# Patient Record
Sex: Male | Born: 1955 | Race: White | Hispanic: No | Smoking: Current every day smoker
Health system: Southern US, Community
[De-identification: ages and names within clinical notes are randomized; demographics above are authoritative.]

## PROBLEM LIST (undated history)

## (undated) DIAGNOSIS — J189 Pneumonia, unspecified organism: Secondary | ICD-10-CM

## (undated) DIAGNOSIS — M545 Low back pain: Secondary | ICD-10-CM

## (undated) DIAGNOSIS — S42002A Fracture of unspecified part of left clavicle, initial encounter for closed fracture: Secondary | ICD-10-CM

## (undated) DIAGNOSIS — B1920 Unspecified viral hepatitis C without hepatic coma: Secondary | ICD-10-CM

## (undated) DIAGNOSIS — I700 Atherosclerosis of aorta: Secondary | ICD-10-CM

## (undated) DIAGNOSIS — F1011 Alcohol abuse, in remission: Secondary | ICD-10-CM

## (undated) DIAGNOSIS — K449 Diaphragmatic hernia without obstruction or gangrene: Secondary | ICD-10-CM

## (undated) DIAGNOSIS — E785 Hyperlipidemia, unspecified: Secondary | ICD-10-CM

## (undated) DIAGNOSIS — K861 Other chronic pancreatitis: Secondary | ICD-10-CM

## (undated) DIAGNOSIS — K219 Gastro-esophageal reflux disease without esophagitis: Secondary | ICD-10-CM

## (undated) DIAGNOSIS — G8929 Other chronic pain: Secondary | ICD-10-CM

## (undated) DIAGNOSIS — K2970 Gastritis, unspecified, without bleeding: Secondary | ICD-10-CM

## (undated) DIAGNOSIS — F411 Generalized anxiety disorder: Secondary | ICD-10-CM

## (undated) DIAGNOSIS — R55 Syncope and collapse: Secondary | ICD-10-CM

## (undated) DIAGNOSIS — E0590 Thyrotoxicosis, unspecified without thyrotoxic crisis or storm: Secondary | ICD-10-CM

## (undated) DIAGNOSIS — M1990 Unspecified osteoarthritis, unspecified site: Secondary | ICD-10-CM

## (undated) DIAGNOSIS — K209 Esophagitis, unspecified: Secondary | ICD-10-CM

## (undated) DIAGNOSIS — R51 Headache: Secondary | ICD-10-CM

## (undated) DIAGNOSIS — F1491 Cocaine use, unspecified, in remission: Secondary | ICD-10-CM

## (undated) DIAGNOSIS — J939 Pneumothorax, unspecified: Secondary | ICD-10-CM

## (undated) DIAGNOSIS — Z87898 Personal history of other specified conditions: Secondary | ICD-10-CM

## (undated) DIAGNOSIS — N400 Enlarged prostate without lower urinary tract symptoms: Secondary | ICD-10-CM

## (undated) DIAGNOSIS — S2242XA Multiple fractures of ribs, left side, initial encounter for closed fracture: Secondary | ICD-10-CM

## (undated) HISTORY — DX: Gastritis, unspecified, without bleeding: K29.70

## (undated) HISTORY — DX: Generalized anxiety disorder: F41.1

## (undated) HISTORY — DX: Hyperlipidemia, unspecified: E78.5

## (undated) HISTORY — DX: Thyrotoxicosis, unspecified without thyrotoxic crisis or storm: E05.90

## (undated) HISTORY — DX: Syncope and collapse: R55

## (undated) HISTORY — DX: Unspecified viral hepatitis C without hepatic coma: B19.20

## (undated) HISTORY — DX: Diaphragmatic hernia without obstruction or gangrene: K44.9

## (undated) HISTORY — DX: Atherosclerosis of aorta: I70.0

## (undated) HISTORY — DX: Benign prostatic hyperplasia without lower urinary tract symptoms: N40.0

## (undated) HISTORY — DX: Esophagitis, unspecified: K20.9

## (undated) HISTORY — DX: Pneumothorax, unspecified: J93.9

---

## 1979-06-03 HISTORY — PX: KNEE ARTHROSCOPY W/ DEBRIDEMENT: SHX1867

## 1989-06-02 HISTORY — PX: SPLENECTOMY, PARTIAL: SHX787

## 1993-10-02 HISTORY — PX: CHOLECYSTECTOMY: SHX55

## 1995-04-02 HISTORY — PX: NISSEN FUNDOPLICATION: SHX2091

## 2001-10-26 ENCOUNTER — Emergency Department (HOSPITAL_COMMUNITY): Admission: EM | Admit: 2001-10-26 | Discharge: 2001-10-26 | Payer: Self-pay | Admitting: *Deleted

## 2001-10-26 ENCOUNTER — Encounter: Payer: Self-pay | Admitting: *Deleted

## 2003-02-25 ENCOUNTER — Emergency Department (HOSPITAL_COMMUNITY): Admission: EM | Admit: 2003-02-25 | Discharge: 2003-02-25 | Payer: Self-pay | Admitting: *Deleted

## 2003-02-25 ENCOUNTER — Encounter: Payer: Self-pay | Admitting: *Deleted

## 2003-04-20 ENCOUNTER — Emergency Department (HOSPITAL_COMMUNITY): Admission: EM | Admit: 2003-04-20 | Discharge: 2003-04-21 | Payer: Self-pay | Admitting: Emergency Medicine

## 2003-04-22 ENCOUNTER — Inpatient Hospital Stay (HOSPITAL_COMMUNITY): Admission: EM | Admit: 2003-04-22 | Discharge: 2003-04-24 | Payer: Self-pay | Admitting: Emergency Medicine

## 2003-04-22 ENCOUNTER — Encounter: Payer: Self-pay | Admitting: *Deleted

## 2003-04-25 ENCOUNTER — Inpatient Hospital Stay (HOSPITAL_COMMUNITY): Admission: EM | Admit: 2003-04-25 | Discharge: 2003-04-27 | Payer: Self-pay | Admitting: Emergency Medicine

## 2004-01-20 ENCOUNTER — Inpatient Hospital Stay (HOSPITAL_COMMUNITY): Admission: EM | Admit: 2004-01-20 | Discharge: 2004-01-21 | Payer: Self-pay | Admitting: Emergency Medicine

## 2004-02-20 ENCOUNTER — Observation Stay (HOSPITAL_COMMUNITY): Admission: EM | Admit: 2004-02-20 | Discharge: 2004-02-22 | Payer: Self-pay | Admitting: Emergency Medicine

## 2004-03-27 ENCOUNTER — Emergency Department (HOSPITAL_COMMUNITY): Admission: EM | Admit: 2004-03-27 | Discharge: 2004-03-28 | Payer: Self-pay | Admitting: Emergency Medicine

## 2005-03-15 ENCOUNTER — Emergency Department (HOSPITAL_COMMUNITY): Admission: EM | Admit: 2005-03-15 | Discharge: 2005-03-15 | Payer: Self-pay | Admitting: Emergency Medicine

## 2005-04-16 ENCOUNTER — Ambulatory Visit: Payer: Self-pay | Admitting: Internal Medicine

## 2005-04-16 ENCOUNTER — Observation Stay (HOSPITAL_COMMUNITY): Admission: EM | Admit: 2005-04-16 | Discharge: 2005-04-17 | Payer: Self-pay | Admitting: *Deleted

## 2005-08-17 ENCOUNTER — Emergency Department (HOSPITAL_COMMUNITY): Admission: EM | Admit: 2005-08-17 | Discharge: 2005-08-17 | Payer: Self-pay | Admitting: Emergency Medicine

## 2006-02-18 ENCOUNTER — Inpatient Hospital Stay (HOSPITAL_COMMUNITY): Admission: EM | Admit: 2006-02-18 | Discharge: 2006-02-18 | Payer: Self-pay | Admitting: Emergency Medicine

## 2006-02-19 ENCOUNTER — Inpatient Hospital Stay (HOSPITAL_COMMUNITY): Admission: EM | Admit: 2006-02-19 | Discharge: 2006-02-19 | Payer: Self-pay | Admitting: Emergency Medicine

## 2006-02-20 ENCOUNTER — Emergency Department (HOSPITAL_COMMUNITY): Admission: EM | Admit: 2006-02-20 | Discharge: 2006-02-20 | Payer: Self-pay | Admitting: Emergency Medicine

## 2006-02-22 ENCOUNTER — Observation Stay (HOSPITAL_COMMUNITY): Admission: EM | Admit: 2006-02-22 | Discharge: 2006-02-23 | Payer: Self-pay | Admitting: Emergency Medicine

## 2006-11-13 ENCOUNTER — Emergency Department (HOSPITAL_COMMUNITY): Admission: EM | Admit: 2006-11-13 | Discharge: 2006-11-13 | Payer: Self-pay | Admitting: Emergency Medicine

## 2007-03-21 ENCOUNTER — Emergency Department (HOSPITAL_COMMUNITY): Admission: EM | Admit: 2007-03-21 | Discharge: 2007-03-21 | Payer: Self-pay | Admitting: Emergency Medicine

## 2007-03-29 ENCOUNTER — Emergency Department (HOSPITAL_COMMUNITY): Admission: EM | Admit: 2007-03-29 | Discharge: 2007-03-29 | Payer: Self-pay | Admitting: Emergency Medicine

## 2007-05-21 ENCOUNTER — Emergency Department (HOSPITAL_COMMUNITY): Admission: EM | Admit: 2007-05-21 | Discharge: 2007-05-21 | Payer: Self-pay | Admitting: Emergency Medicine

## 2008-02-25 ENCOUNTER — Emergency Department (HOSPITAL_COMMUNITY): Admission: EM | Admit: 2008-02-25 | Discharge: 2008-02-26 | Payer: Self-pay | Admitting: Emergency Medicine

## 2008-04-20 ENCOUNTER — Emergency Department (HOSPITAL_COMMUNITY): Admission: EM | Admit: 2008-04-20 | Discharge: 2008-04-20 | Payer: Self-pay | Admitting: Emergency Medicine

## 2008-05-02 ENCOUNTER — Observation Stay (HOSPITAL_COMMUNITY): Admission: EM | Admit: 2008-05-02 | Discharge: 2008-05-07 | Payer: Self-pay | Admitting: Emergency Medicine

## 2008-05-30 ENCOUNTER — Inpatient Hospital Stay (HOSPITAL_COMMUNITY): Admission: EM | Admit: 2008-05-30 | Discharge: 2008-06-14 | Payer: Self-pay | Admitting: Emergency Medicine

## 2008-06-03 ENCOUNTER — Encounter (INDEPENDENT_AMBULATORY_CARE_PROVIDER_SITE_OTHER): Payer: Self-pay | Admitting: Gastroenterology

## 2008-06-11 ENCOUNTER — Encounter (INDEPENDENT_AMBULATORY_CARE_PROVIDER_SITE_OTHER): Payer: Self-pay | Admitting: Gastroenterology

## 2008-06-12 DIAGNOSIS — B1920 Unspecified viral hepatitis C without hepatic coma: Secondary | ICD-10-CM

## 2008-06-12 HISTORY — DX: Unspecified viral hepatitis C without hepatic coma: B19.20

## 2008-06-23 ENCOUNTER — Emergency Department (HOSPITAL_COMMUNITY): Admission: EM | Admit: 2008-06-23 | Discharge: 2008-06-23 | Payer: Self-pay | Admitting: Emergency Medicine

## 2008-07-05 ENCOUNTER — Emergency Department (HOSPITAL_COMMUNITY): Admission: EM | Admit: 2008-07-05 | Discharge: 2008-07-05 | Payer: Self-pay | Admitting: Emergency Medicine

## 2008-07-10 ENCOUNTER — Emergency Department (HOSPITAL_COMMUNITY): Admission: EM | Admit: 2008-07-10 | Discharge: 2008-07-11 | Payer: Self-pay | Admitting: Emergency Medicine

## 2008-07-12 ENCOUNTER — Ambulatory Visit: Payer: Self-pay | Admitting: Family Medicine

## 2008-07-12 ENCOUNTER — Inpatient Hospital Stay (HOSPITAL_COMMUNITY): Admission: EM | Admit: 2008-07-12 | Discharge: 2008-07-14 | Payer: Self-pay | Admitting: Emergency Medicine

## 2008-07-19 ENCOUNTER — Emergency Department (HOSPITAL_COMMUNITY): Admission: EM | Admit: 2008-07-19 | Discharge: 2008-07-20 | Payer: Self-pay | Admitting: Emergency Medicine

## 2008-07-22 ENCOUNTER — Ambulatory Visit: Payer: Self-pay | Admitting: Family Medicine

## 2009-01-25 ENCOUNTER — Emergency Department (HOSPITAL_COMMUNITY): Admission: EM | Admit: 2009-01-25 | Discharge: 2009-01-25 | Payer: Self-pay | Admitting: Emergency Medicine

## 2009-01-28 ENCOUNTER — Emergency Department (HOSPITAL_COMMUNITY): Admission: EM | Admit: 2009-01-28 | Discharge: 2009-01-28 | Payer: Self-pay | Admitting: Emergency Medicine

## 2009-05-03 ENCOUNTER — Emergency Department (HOSPITAL_COMMUNITY): Admission: EM | Admit: 2009-05-03 | Discharge: 2009-05-04 | Payer: Self-pay | Admitting: Emergency Medicine

## 2009-05-14 ENCOUNTER — Emergency Department (HOSPITAL_COMMUNITY): Admission: EM | Admit: 2009-05-14 | Discharge: 2009-05-14 | Payer: Self-pay | Admitting: Emergency Medicine

## 2011-01-07 LAB — COMPREHENSIVE METABOLIC PANEL
ALT: 108 U/L — ABNORMAL HIGH (ref 0–53)
AST: 104 U/L — ABNORMAL HIGH (ref 0–37)
AST: 36 U/L (ref 0–37)
Albumin: 4.8 g/dL (ref 3.5–5.2)
Alkaline Phosphatase: 107 U/L (ref 39–117)
BUN: 9 mg/dL (ref 6–23)
CO2: 27 mEq/L (ref 19–32)
Calcium: 9.7 mg/dL (ref 8.4–10.5)
Chloride: 102 mEq/L (ref 96–112)
Creatinine, Ser: 1.13 mg/dL (ref 0.4–1.5)
GFR calc Af Amer: >60 mL/min (ref >60)
GFR calc Af Amer: >60 mL/min (ref >60)
GFR calc non Af Amer: >60 mL/min (ref >60)
Glucose, Bld: 113 mg/dL — ABNORMAL HIGH (ref 70–99)
Potassium: 4.9 mEq/L (ref 3.5–5.1)
Sodium: 138 mEq/L (ref 135–145)
Sodium: 139 mEq/L (ref 135–145)
Total Bilirubin: 0.9 mg/dL (ref 0.3–1.2)
Total Protein: 7.4 g/dL (ref 6.0–8.3)
Total Protein: 9.2 g/dL — ABNORMAL HIGH (ref 6.0–8.3)

## 2011-01-07 LAB — POCT I-STAT, CHEM 8
Calcium, Ion: 1.17 mmol/L (ref 1.12–1.32)
Creatinine, Ser: 0.9 mg/dL (ref 0.4–1.5)
Glucose, Bld: 134 mg/dL — ABNORMAL HIGH (ref 70–99)
HCT: 52 % (ref 39.0–52.0)
Hemoglobin: 17.7 g/dL — ABNORMAL HIGH (ref 13.0–17.0)
Potassium: 4.9 mEq/L (ref 3.5–5.1)

## 2011-01-07 LAB — DIFFERENTIAL
Basophils Absolute: 0.1 10*3/uL (ref 0.0–0.1)
Basophils Relative: 1 % (ref 0–1)
Eosinophils Absolute: 0 10*3/uL (ref 0.0–0.7)
Eosinophils Relative: 0 % (ref 0–5)
Lymphocytes Relative: 22 % (ref 12–46)
Lymphs Abs: 2.2 10*3/uL (ref 0.7–4.0)
Monocytes Absolute: 0.2 10*3/uL (ref 0.1–1.0)
Monocytes Relative: 13 % — ABNORMAL HIGH (ref 3–12)
Monocytes Relative: 3 % (ref 3–12)
Neutrophils Relative %: 63 % (ref 43–77)

## 2011-01-07 LAB — LIPASE, BLOOD: Lipase: 31 U/L (ref 11–59)

## 2011-01-07 LAB — CBC
HCT: 46.5 % (ref 39.0–52.0)
MCHC: 34 g/dL (ref 30.0–36.0)
MCV: 98.9 fL (ref 78.0–100.0)
Platelets: 291 10*3/uL (ref 150–400)
RBC: 4.51 MIL/uL (ref 4.22–5.81)
RDW: 13.1 % (ref 11.5–15.5)
RDW: 13.7 % (ref 11.5–15.5)
WBC: 7.5 10*3/uL (ref 4.0–10.5)

## 2011-01-07 LAB — HEMOCCULT GUIAC POC 1CARD (OFFICE): Fecal Occult Bld: NEGATIVE

## 2011-01-07 LAB — URINALYSIS, ROUTINE W REFLEX MICROSCOPIC
Glucose, UA: NEGATIVE mg/dL
Hgb urine dipstick: NEGATIVE
Specific Gravity, Urine: 1.029 (ref 1.005–1.030)
Urobilinogen, UA: 1 mg/dL (ref 0.0–1.0)

## 2011-01-07 LAB — URINE MICROSCOPIC-ADD ON

## 2011-01-11 LAB — COMPREHENSIVE METABOLIC PANEL
Albumin: 4.4 g/dL (ref 3.5–5.2)
Alkaline Phosphatase: 82 U/L (ref 39–117)
BUN: 21 mg/dL (ref 6–23)
CO2: 27 mEq/L (ref 19–32)
Chloride: 105 mEq/L (ref 96–112)
GFR calc non Af Amer: >60 mL/min (ref >60)
Glucose, Bld: 142 mg/dL — ABNORMAL HIGH (ref 70–99)
Potassium: 3.8 mEq/L (ref 3.5–5.1)
Total Bilirubin: 0.6 mg/dL (ref 0.3–1.2)

## 2011-01-11 LAB — URINALYSIS, ROUTINE W REFLEX MICROSCOPIC
Bilirubin Urine: NEGATIVE
Nitrite: NEGATIVE
Specific Gravity, Urine: >1.03 — ABNORMAL HIGH (ref 1.005–1.030)
Urobilinogen, UA: 0.2 mg/dL (ref 0.0–1.0)

## 2011-01-11 LAB — CBC
HCT: 46.1 % (ref 39.0–52.0)
Hemoglobin: 15.9 g/dL (ref 13.0–17.0)
MCV: 97.6 fL (ref 78.0–100.0)
RBC: 4.72 MIL/uL (ref 4.22–5.81)
WBC: 11.4 10*3/uL — ABNORMAL HIGH (ref 4.0–10.5)

## 2011-01-11 LAB — DIFFERENTIAL
Basophils Absolute: 0 10*3/uL (ref 0.0–0.1)
Basophils Relative: 0 % (ref 0–1)
Monocytes Absolute: 0.6 10*3/uL (ref 0.1–1.0)
Neutro Abs: 9 10*3/uL — ABNORMAL HIGH (ref 1.7–7.7)
Neutrophils Relative %: 79 % — ABNORMAL HIGH (ref 43–77)

## 2011-01-11 LAB — URINE MICROSCOPIC-ADD ON

## 2011-01-11 LAB — LIPASE, BLOOD: Lipase: 20 U/L (ref 11–59)

## 2011-02-14 NOTE — Discharge Summary (Signed)
[name], [name]              ACCOUNT NO.:  [id number]   MEDICAL RECORD NO.:  [id number]          PATIENT TYPE:  INP   LOCATION:  5157                         FACILITY:  [facility]   PHYSICIAN:  Leighton Roach McDiarmid, M.D.DATE OF BIRTH:  [birth year]   DATE OF ADMISSION:  07/12/2008  DATE OF DISCHARGE:  07/14/2008                               DISCHARGE SUMMARY   PRIMARY CARE [name]:  Will be [facility].   DISCHARGE DIAGNOSES:  1. Possible Chronic pancreatitis exacerbation, though normal MRCP of      pancreas this admission.  2. Alcohol dependence.  3. Nicotine dependence.  4. Chronic pain issues.  5. Lack of health care/homelessness, difficulty with follow-up.   DISCHARGE MEDICATIONS:  Included Percocet 5/325 mg p.o. q6 h. p.r.n.  pain.  The patient was given enough to make it to his [facility]  appointment on July 22, 2008 at 11:15.   There were no consults for this patient while he was in the hospital.   PROCEDURES:  He did have an MRCP while in the hospital that showed a  normal-appearing pancreas with no pancreatic or peripancreatic edema or  fluid.  No pancreatic duct dilation.  No evidence for pseudocyst or  peripancreatic abscess.  No evidence for choledocholithiasis.  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  He also had an acute abdominal series on  July 12, 2008, which showed unremarkable bowel gas pattern and no  acute thoracic findings.   PERTINENT LABORATORY DATA:  For this hospitalization includes a CBC with  white blood count of 6.7, hemoglobin 13.8, hematocrit 41.2 and platelets  of 346.  He had a PT of 12.1, an INR of 0.9, a PTT of 26.  A urinalysis  that showed amber-colored urine that was hazy with a specific gravity of  1.031.  He had small bilirubin, 15 ketones, negative nitrites, negative  leukocytes.  His CMP showed sodium of 134, potassium of 3.7, chloride  101, bicarb 27, BUN 15,  creatinine 0.79, glucose 119.  His total  bilirubin was 0.6, alk phos 69, AST 69, ALT 115, total protein 7.4,  albumin 3.9, calcium 9.  He had an alcohol level that was less than 5.  He had a lipase of 26.  His urine drug screen was positive for  benzodiazepines, but all others were negative.  On the day prior to  discharge, he had a CBC on October 12:  White blood cell count of 6.9,  hemoglobin 12.7, hematocrit 38.3, platelets of 323, and he had a CMP  which showed a sodium of 139, potassium 3.4, chloride of 107, bicarb 24,  BUN 11, creatinine 0.77 with a glucose of 93.  He had a urine culture  which showed no growth.   BRIEF HOSPITAL COURSE:  This is a 55 year old gentleman that came in  with abdominal pain with nausea and vomiting.  He has had multiple  visits to the ER.  He had been discharged from the hospital on September  22 and had been back to the hospital multiple times with similar  complaints  of nausea, vomiting, and abdominal pain.  At this time, he  returned with nausea, vomiting, abdominal pain, and back pain.  He also  complained of lower limb and muscle aching and that his hands are tingly  in the morning.  He mentioned that he was not able to hold down any  food.  He denied any sore throat or fever or loose stools.  He said that  his last alcoholic drink had been the Thursday before admission which  was 3-4 days.  He claims that Percocet did not do anything for his pain  and that Phenergan did not do anything for his nausea.  For his  abdominal pain, the patient had had an extensive workup for chronic  pancreatitis in the past, so on this admission, our goal really was to  hydrate him.  We gave him a bolus with a banana bag and with D5 half  normal saline at 125 per hour.  He was continued to get Zofran and  morphine for the nausea and vomiting and pain.  During his hospital  stay, he did get an MRCP.  The patient had had prior studies done by GI  in September of 2009.   He had an EGD that showed portal hypertension,  hypertensive gastropathy and thickening of the pylorus.  He has had  biopsies that showed no H. Pylori.  He had a colonoscopy and was found  to have medium-sized polyps, a tubular adenoma without evidence of  dysplasia or malignancy.  Also, they felt the pain could be secondary to  chronic pancreatitis as there were no calcifications of the pancreas on  CT.  For his alcohol dependence, the patient does drink quite a bit of  alcohol and on previous admissions had had an elevated alcohol level.  On October 9, it had been 147.  There was concern that the patient may  be going into alcohol withdrawal.  He was started on the CIWA protocol  with Ativan and tolerated it well.  The patient was counseled that the  alcohol likely was causing the abdominal pain with his pancreas.  1. Nicotine addition.  The patient smokes about half pack per day per      the patient.  While he was in house, he was put on a nicotine patch      for replacement of nicotine.  2. Chronic pain issue.  The patient does have chronic pain that he      comes to the ED to have treated.  The plan was to get the patient      set up with an appointment for follow-up as an outpatient with      [facility].  There was a bit of a disposition problem due to the      fact that the patient had no identification and can only be seen at      [facility] if he has identification.  He was given the paperwork      to fill out to order his birth certificate so that he could get      identification so that he could be seen [facility].  He filled out      the information, and it was faxed from the hospital.  He also had      an appointment that was set up for him at [facility] on July 22, 2008.  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  He declined social work help in getting him      to a different living situation.  For the chronic pain issues  that      the patient has, it would be worth considering getting him to a      pain clinic to see if they could consider celiac plexus ablation.      Also for his pancreatitis, it would be worth considering adding      pancreatic enzymes with his meals if his chronic pancreatitis does      not subside.   DISCHARGE INSTRUCTIONS:  For this patient included following up with  [facility].  He was to be on a low-fat diet.  The patient was  discharged home with Percocet for pain management.  He was in stable  medical condition.      Jamie Brookes, MD  Electronically Signed      Leighton Roach McDiarmid, M.D.  Electronically Signed    AS/MEDQ  D:  07/21/2008  T:  07/21/2008  Job:  [id number]   cc:   Melvern Banker
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
## 2011-02-14 NOTE — Discharge Summary (Signed)
[name], [name]              ACCOUNT NO.:  [id number]   MEDICAL RECORD NO.:  [id number]          PATIENT TYPE:  INP   LOCATION:  6715                         FACILITY:  [facility]   PHYSICIAN:  Hillery Aldo, M.D.   DATE OF BIRTH:  1956-03-14   DATE OF ADMISSION:  05/30/2008  DATE OF DISCHARGE:  06/12/2008                               DISCHARGE SUMMARY   PRIMARY CARE PHYSICIAN:  None.   DISCHARGE DIAGNOSES:  1. Acute exacerbation of chronic abdominal pain.  2. Back pain.  3. Likely irritable bowel syndrome.  4. Fatty liver.  5. Portal hypertension.  6. 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  7. Hepatitis C.  8. Transaminitis.  9. Alcohol dependency.  10.Alcohol-induced hepatitis.  11.Opiate dependence.  12.Tobacco abuse.  13.Macrocytosis.   DISCHARGE MEDICATIONS:  1. Lortab 1-2 tablets q.4 h. p.r.n.  2. Xanax 1 mg t.i.d. p.r.n.  3. Prevacid 40 mg b.i.d.  4. Levsin 0.25 mg q.4 h. p.r.n.  5. Multivitamin daily.   CONSULTATIONS:  1. Shirley Friar, MD  2. Bernette Redbird, MD   BRIEF ADMISSION HISTORY OF PRESENT ILLNESS:  The patient is a 55-year-  old male with alcohol dependence and poor social circumstances who  presents to the hospital with repeated complaints of abdominal pain.  The patient has had numerous visits to the emergency department and  subsequent discharges and multiple evaluations for the source of his  abdominal pain, all of which have been unrevealing.  Nevertheless, he  presented with recurrent abdominal pain in the setting of ongoing  alcohol use and was admitted for further evaluation and workup due to  his frequent representations to the emergency department.  For the full  details, please see the dictated report done by Dr. Waymon Amato.   PROCEDURES AND DIAGNOSTIC STUDIES:  1. Two views of the abdomen on June 01, 2008, showed no acute      abnormalities with an unremarkable bowel gas pattern.  2. Abdominal ultrasound on June 01, 2008, showed probable fatty      infiltration of the liver.  There was a simple cyst in the upper      pole, right kidney.  The gallbladder and spleen were surgically      absent.  3. Esophagoduodenoscopy on June 04, 2008, showed portal      hypertensive gastropathy with diffuse mosaic pattern secondary to      alcohol use.  Thickening in the pyloric channel, status post      biopsies.  No ulcers and no varices were noted.  Biopsy results      were consistent with portal hypertensive gastropathy.  No evidence      of Helicobacter pylori, intestinal metaplasia, dysplasia, or      malignancy was identified.  4. Barium enema on June 09, 2008, showed persistent stricture in      the right colon with some mucosal irregularity just above the      ileocecal valve region.  5. Colonoscopy on June 11, 2008, showed no evidence of proximal      colonic lesion even after careful repeated inspection of the  proximal colonic area despite radiographic abnormality in that      region.  Medium-sized colon polyps removed and sent for pathology,      which is pending at the time of this dictation.  6. One view of the abdomen on June 11, 2008, showed large volume      of retained barium throughout the colon.   DISCHARGE LABORATORY VALUES:  CBC was completely normal.  Basic  metabolic panel was completely normal.  Antigliadin antibodies were  negative.  Stool cultures revealed no suspicious growth.  Fecal  lactoferrin was negative.  C. difficile toxin was negative.   HOSPITAL COURSE BY PROBLEMS:  1. Acute exacerbation of chronic abdominal pain:  The patient has had      multiple evaluations and multiple presentations to the hospital for      this issue.  He has undergone numerous diagnostic testing all of      which have been unrevealing..  The patient has repeatedly been      advised to discontinue alcohol and has repeatedly gone back to      alcohol use.  At this point, we have no  further diagnostic testing      or recommendations for this patient other than to avoid alcohol and      continue his proton pump inhibitor therapy.  He may also have some      component of irritable bowel syndrome and was educated with regard      to the need to maintain his regularity and increased to fiber in      his diet.  A component of his abdominal pain may also be due to      degenerative disk and osteoarthritis of the back.  Notably, he has      had an MRI of the lumbar spine on a previous admission, which did      show degenerative disk disease and some herniation at L4-L5.  There      was also an annular tear at L5-S1 and a small left posterolateral      protrusion approaching the left S1 nerve root.  This is likely the      source of his flank and left lower quadrant pain.  The patient is      not an operative candidate and has been advised that other than      pain control and possibly engaging physical therapy, no other      treatment would be indicated at this time.  2. Fatty liver/portal hypertension/gastropathy:  The patient has been      advised to discontinue alcohol use.  3. Hepatitis C positivity with transaminitis and alcohol-induced      hepatitis.  The patient does have moderate elevation of his LFTs      and should follow up at [facility] for ongoing surveillance.  4. Alcohol dependency:  The patient underwent detoxification with an      Ativan protocol.  He was supplemented with thiamine and folic acid.      He will be referred to alcohol and drug services at discharge for      ongoing substance abuse counseling.  5. Opiate dependency:  The patient does have a tendency to request      frequent doses of opiates.  He has been advised that this is not a      good long-term treatment for his underlying issues.  The opiates      have been weaned to 1-2 tablets  of Vicodin every 6 hours.  We do      recommend ultimate tapering and discontinuation entirely of all       opiates.  6. Tobacco abuse:  The patient was provided with a nicotine patch      while in the hospital.  Despite this, he continued to attempt to      sneak cigarettes and smoke in his bathroom.  He has been advised      that tobacco use will exacerbate his underlying medical conditions      and he should discontinue tobacco use.  7. Macrocytosis:  The patient was supplemented with a multivitamin and      thiamine.  8. Anxiety and depression:  The patient responded well to Lexapro and      Klonopin.  Unfortunately, he has no pair source and will not be      able to afford these at discharge.  We will have the care      manager/social worker help set up followup at [facility] and get      him plugged in, so that he can receive medical services as      indicated.   DISPOSITION:  The patient is currently homeless and we are attempting to  obtain a disposition for him at one of the local shelters.  If such, a  disposition can be arranged, he will be discharged today.  Otherwise, it  may have to wait until we have an appropriate place to discharge him to.      Hillery Aldo, M.D.  Electronically Signed     CR/MEDQ  D:  06/12/2008  T:  06/12/2008  Job:  [id number]
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
## 2011-02-14 NOTE — Op Note (Signed)
[name], [name]              ACCOUNT NO.:  [id number]   MEDICAL RECORD NO.:  [id number]          PATIENT TYPE:  INP   LOCATION:                               FACILITY:  [facility]   PHYSICIAN:  Shirley Friar, MDDATE OF BIRTH:  [birth year]   DATE OF PROCEDURE:  DATE OF DISCHARGE:                               OPERATIVE REPORT   INDICATION:  Abdominal pain.   MEDICATIONS:  Fentanyl 100 mcg IV, Versed 10 mg IV, and Phenergan 50 mg  IV.   FINDINGS:  Endoscope was inserted through oropharynx and esophagus was  intubated, which was normal in its entirety.  No varices were seen.  Endoscope was passed down to the stomach, which revealed a diffuse  mosaic pattern in the stomach consistent with portal hypertensive  gastropathy.  Retroflexion was done, which revealed no other mucosal  abnormalities except for the mosaic pattern.  Endoscope was straightened  and advanced down into the pyloric channel where there was a thickened  portion noted at 12 o'clock position.  Endoscope was advanced into the  duodenal bulb and second portion of duodenum which were both normal.  Endoscope was withdrawn back into the stomach and two biopsies were  taken of this thickened fold in the pyloric channel.  No ulcers or mass  lesions were seen.   ASSESSMENT:  1. Portal hypertensive gastropathy with diffuse mosaic pattern      secondary to alcohol use.  2. Thickening in pyloric channel, status post biopsies.  3. No ulcers and no varices noted.   PLAN:  1. Stop drink and alcohol.  2. Follow up on pathology.  3. Clear liquid diet and advance as tolerated.      Shirley Friar, MD  Electronically Signed     VCS/MEDQ  D:  06/04/2008  T:  06/04/2008  Job:  [id number]

## 2011-02-14 NOTE — Op Note (Signed)
[name], [name]              ACCOUNT NO.:  [id number]   MEDICAL RECORD NO.:  [id number]          PATIENT TYPE:  INP   LOCATION:  6715                         FACILITY:  [facility]   PHYSICIAN:  Bernette Redbird, M.D.   DATE OF BIRTH:  02-04-1956   DATE OF PROCEDURE:  06/11/2008  DATE OF DISCHARGE:                               OPERATIVE REPORT   PROCEDURE:  Colonoscopy with polypectomy.   INDICATIONS:  This is a 55 year old gentleman, unassigned patient, on  the [facility] hospitalist service with a history of abdominal pain in  the setting of alcohol and tobacco abuse, who, as part of this  evaluation, underwent a barium enema yesterday.  There is the suggestion  of an obstructing mass in the proximal colon.   FINDINGS:  No evidence of intraluminal mass.  Medium-size polyp snared  from the mid colon.   PROCEDURE:  The procedure had been discussed with the patient who  provided written consent.  He was brought from his hospital room  following a magnesium citrate and MiraLax prep.  Sedation was Phenergan  25 mg, fentanyl 125 mcg, and Versed 12 mg IV without clinical  instability.  Digital exam of the prostate was unremarkable.   The Pentax pediatric video colonoscope was advanced without significant  difficulty around the colon.   Ultimately, I reached the cecum as identified by visualization of the  appendiceal orifice.  There was quite a bit of spasm in the cecal region  which might account for the above-mentioned radiographic abnormality,  but with irrigation with water, careful inspection, insufflation with  air, and simply waiting for the spasm to improve, it was possible to see  the entire region of the cecum and the proximal ascending colon very  clearly, and it is not felt that any mass lesion was present whatsoever.  As further confirmation, I entered the terminal ileum for moderate  distance, thereby confirming cecal location.  There was no evidence of  ileitis.   No  mucosal abnormalities were identified on this exam, specifically no  evidence of inflammatory bowel disease.   There was an 8-mm semi-pedunculated polyp on the fold in the transverse  colon removed by cold snare technique and suctioned through the scope.  No other polyps were seen.   There was a small-to-moderate amount of residual barium throughout the  colon from yesterday's barium enema, despite his prep.  This was rinsed  copiously, using 3 L of water during the course of this 1-hour 5-minute  exam, and it was felt that no significant lesions would have been  missed.   Retroflexion in the rectum and pullout through the anal canal  demonstrated a slightly verrucous possible early sessile polyp right at  the anal verge, not clear whether it was above or below the dentate  line, but directly juxtaposed to an excoriated hemorrhoid.  I touched it  with a biopsy forceps, needle, and the patient indicated it hurt.  I  elected not to biopsy it.   Reinspection of the rectum was unremarkable.   IMPRESSION:  1. No evidence of proximal colonic lesion even after careful  repeated      inspection of the proximal colonic area,      despite radiographic abnormality in that region (793.4).  2. Medium-sized colon polyp removed as described above.   PLAN:  Await pathology on the polyp.  CT scan tomorrow to look for any  other source of his low abdominal pain.           ______________________________  Bernette Redbird, M.D.     RB/MEDQ  D:  06/11/2008  T:  06/12/2008  Job:  [id number]

## 2011-02-14 NOTE — Discharge Summary (Signed)
NAMEJAMAL, [name]              ACCOUNT NO.:  [id number]   MEDICAL RECORD NO.:  [id number]          PATIENT TYPE:  INP   LOCATION:  5508                         FACILITY:  [facility]   PHYSICIAN:  Hind I Elsaid, MD      DATE OF BIRTH:  16-Apr-1956   DATE OF ADMISSION:  05/01/2008  DATE OF DISCHARGE:  05/07/2008                               DISCHARGE SUMMARY   DISCHARGE DIAGNOSES:  1. Nonspecific abdominal pain.  2. History of alcoholic gastritis.  3. History of drug and alcohol abuse.  4. History of gastroesophageal reflux.  5. History of laparoscopic cholecystectomy.  6. Polysubstance abuse.  7. Smoking.  8. Subclinical hypothyroidism.  9. Back pain, felt to be secondary to degenerative joint disease.   DISCHARGE MEDICATIONS:  1. Prevacid 30 mg daily.  2. Phenergan 25 mg every 8 hours as needed, only 1 week dispensed.  3. Lortab 7.5/500 mg every 8 hours, 1 week dispensed.   PROCEDURES:  Abdominal x-ray, nonobstructive bowel-gas pattern.  CT  abdomen and pelvis, no acute finding, mild diffuse fatty infiltration of  the liver, and tiny hepatic lobe hemangioma.  MRI of the L-spine, disk  degeneration with disk herniation.   HISTORY OF PRESENT ILLNESS:  1. This is a 55 year old Caucasian male with a history of recurrent      abdominal pain for 15 years, who had multiple abdominal surgeries,      who is on chronic pain medication.  History of heavy alcohol abuse,      polysubstance abuse.  He was admitted to the hospital with      intermittent severe central abdominal pain for the last 2 days      associated with nausea and vomiting.  The patient was admitted to      the hospital and kept on clear liquid diet.  Amylase and lipase      were negative.  CT of the abdomen and pelvis inconclusive of any      acute pathology.  During hospitalization, advanced diet was done.      The patient tolerated the diet very well.  We felt the abdominal      pain is nonspecific in nature, and the  patient was asked to follow      with his primary care physician.  Pain could be referral or could      be secondary to narcotic dependence.  At this time, abdominal      examination is completely soft and benign with good bowel sounds.      The patient tolerated 100% diet during the hospital stay.  We did      not feel that the patient needed hospitalization for the above      issue.  The patient will receive Lortab and Phenergan, 1-week      supply, and he needed to follow with his primary care physician.  2. Polysubstance abuse.  Social worker did evaluate and see the      patient, but the patient denied any Child psychotherapist need.  The      patient admitted that he will quit  alcohol by himself.  He does not      require any help for that.  3. Subclinical hypothyroidism.  The patient found to have a TSH of 6.7      and normal free T4.  At this time, we did not recommend any      medication.  The patient need to follow up thyroid function test as      an outpatient.   DISPOSITION:  It was felt that the patient is medically stable to be  discharged home.      Hind Bosie Helper, MD  Electronically Signed     HIE/MEDQ  D:  05/07/2008  T:  05/07/2008  Job:  [id number]

## 2011-02-14 NOTE — Consult Note (Signed)
[name], [name]              ACCOUNT NO.:  [id number]   MEDICAL RECORD NO.:  [id number]          PATIENT TYPE:  INP   LOCATION:  6715                         FACILITY:  [facility]   PHYSICIAN:  [name], MDDATE OF BIRTH:  1956-05-07   DATE OF CONSULTATION:  DATE OF DISCHARGE:                                 CONSULTATION   REQUESTING DOCTOR:  Miguel Aldo, MD   REASON FOR CONSULTATION:  Abdominal pain.   HISTORY OF PRESENT ILLNESS:  [name] is a 55 year old white male with  history of alcohol abuse who has been seen secondary to chronic  abdominal pain.  He was admitted on May 30, 2008 secondary to  abdominal pain in the setting of ongoing alcohol abuse.  He reports  consuming up to 6-12 beers per day.  He gives a history of chronic  pancreatitis and says he last had pancreatitis several weeks ago.  On  further review of his chart, he was discharged on May 07, 2008 for  nonspecific abdominal pain and history of alcoholic gastritis.  CT scan  done on May 02, 2008 was negative for pancreatitis and no  calcifications in the pancreas were seen.  He reports chronic  generalized abdominal pain, but reports that he was pain free when he  was discharged in early August for approximately a week.  He says the  pain is generalized over the abdomen and also radiates to the left side  of his back.  He feels that drinking alcohol makes the pain worse.  He  has some nausea, but denies any vomiting.  The patient denies any fevers  or chills.   PAST MEDICAL HISTORY:  1. Polysubstance abuse.  2. Alcohol abuse.  3. History of alcoholic gastritis.  4. Gastroesophageal reflux disease.  5. Hypothyroidism.  6. Chronic back pain.  7. Chronic pain.  8. Questionable history of pancreatitis, status post Nissen      fundoplication (The patient reports partial takedown of this,      although record is not available).  9. Laparoscopic cholecystectomy.  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   CURRENT  MEDICATIONS:  Lovenox, Dilaudid, Ativan, nicotine patch,  Protonix, and thiamine.   ALLERGIES:  No known drug allergies.   FAMILY HISTORY:  Noncontributory.   REVIEW OF SYSTEMS:  Negative except as stated above.   PHYSICAL EXAM:  VITAL SIGNS:  Temperature 97.0, pulse 60, blood pressure  132/75, O2 sats 98% on room air.  GENERAL:  Alert, no acute distress.  ABDOMEN:  Generalized abdominal tenderness with guarding, soft,  nondistended, positive bowel sounds.   LABORATORY DATA:  White blood count 6.9, hemoglobin 14.6, platelet count  265, INR 0.8, lipase 24.  Alcohol level on admission 248.   IMPRESSION:  A 55 year old white male with chronic abdominal pain in the  setting of chronic alcohol abuse.  The patient gives a history of  chronic pancreatitis, but has no records to document that history.  Despite his normal lipase, his pain could be due to chronic  pancreatitis.  If that is indeed an issue, although his recent CT scan  did not show any calcifications to  suggest that.  We will plan to do  upper endoscopy in the morning to look for ulcers.  He may benefit from  being on pancreatic enzymes if chronic pancreatitis is documented.  Most  likely, his alcohol is the main contributor to his symptoms.      Miguel Friar, MD  Electronically Signed     VCS/MEDQ  D:  06/03/2008  T:  06/04/2008  Job:  [id number]
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
## 2011-02-17 NOTE — Discharge Summary (Signed)
[name], [name]                        ACCOUNT NO.:  [id number]   MEDICAL RECORD NO.:  [id number]                   PATIENT TYPE:  INP   LOCATION:  A321                                 FACILITY:  [facility]   PHYSICIAN:  Dirk Dress. Katrinka Blazing, M.D.                DATE OF BIRTH:  [birth year]   DATE OF ADMISSION:  01/20/2004  DATE OF DISCHARGE:  01/21/2004                                 DISCHARGE SUMMARY   DISCHARGE DIAGNOSES:  1. Suspected alcohol gastritis.  2. Drug and alcohol abuse.  3. History of end-stage renal disease.  4. Chronic anxiety disorder.   DISPOSITION:  Patient left the hospital against medical advice before any  significant intervention could be turned out. He left without medications.   HISTORY:  This is a 55 year old male with known history of severe abdominal  pain, nausea, and vomiting. His wife states that he had rectal bleeding. The  patient had been on an alcohol binge for the past three weeks due to the  death of his father. He denies use of drugs, but he had a positive urine  drug screen for cocaine, marijuana, and opiates, but none of which he was on  prescriptions for.  The patient had an acute abdomen, profusely tender, with  recurrent episodes of nausea and vomiting.   LABORATORY DATA:  SGOT 72, SGPT 119.  Urine positive for benzodiazepine,  cocaine, marijuana, and opiates. White count was 15,500 on admission, and  10,600 on the following morning. No other labs could be ordered.   On the morning after admission the patient states that he felt better, was  concerned because of the death of his father and plan was to do endoscopic  evaluation and adjust his medications for depression. The patient simply  walked out of the hospital and never returned. Status at the time of his  departure from the hospital was not known.     ___________________________________________                                         Dirk Dress. Katrinka Blazing, M.D.   LCS/MEDQ  D:  02/14/2004   T:  02/14/2004  Job:  [id number]

## 2011-02-17 NOTE — Group Therapy Note (Signed)
[name], [name]              ACCOUNT NO.:  [id number]   MEDICAL RECORD NO.:  [id number]          PATIENT TYPE:  INP   LOCATION:  A331                          FACILITY:  [facility]   PHYSICIAN:  Osvaldo Shipper, MD     DATE OF BIRTH:  May 17, 1956   DATE OF PROCEDURE:  02/19/2006  DATE OF DISCHARGE:                                   PROGRESS NOTE   SUBJECTIVELY:  The patient is still complaining of abdominal pain, about 7-  8/10 in intensity.  He is complaining of nausea and vomiting.  He is  requesting more pain medications.  The patient was noted to be smoking when  I walked into the room.  The patient was told not to smoke in the hospital  premises.  He seems to understand.  I have also informed the nurse  supervisor.   OBJECTIVELY:  VITAL SIGNS:  The patient's temperature is 97, heart rate is  recorded as 49-74, respiratory rate is about 20, blood pressure is 96/52,  saturation is 99% on room air.  LUNGS:  Clear to auscultation anteriorly.  CARDIOVASCULAR EXAM:  S1, S2 normal, regular.  ABDOMEN:  Soft.  There is tenderness diffusely, mostly in the epigastric  area.  Bowel sounds are present.  No mass is appreciated.  No rebound  tenderness present.   LABORATORY DATA:  His white count is 9.5, hemoglobin is 11.9, platelet count  is 292.  His CMP reveals glucose 103, his AST 60, ALT is 60, as well.  Albumin is 2.8.  His alkaline phosphatase and bilirubin are normal.  Amylase  and lipase have come down to normal levels today from 302 and 140 yesterday.  Urine drug screen was positive for benzos, cocaine, opiates and THC.  Alcohol level was less than 5 last evening.  Urinalysis was unremarkable.   IMAGING:  No imaging studies have been done.   ASSESSMENT AND PLAN:  1.  Acute pancreatitis.  His pancreatic enzymes have returned to normal,      though he subjectively does have symptoms of pain.  He is on clear      liquid diet, which should be continued.  He probably would benefit  from      some imaging studies.  I will consult GI to help [place] with that.  We can      consider either an ultrasound or CAT scan.  The patient has been known      to have alcoholic pancreatitis in the past, though he denies significant      use of alcohol at this time.  He is status post cholecystectomy in the      past, as well.  Considering the above, other etiologies for his      pancreatitis may need to be considered.  I would also like to look at      the common bile duct, as well.   1.  Tobacco use.  The patient has been strongly warned not to use tobacco in      the hospital.  Nursing supervisor is also working to insure that he  refrains from smoking.   1.  Polysubstance abuse.  We will have social work and ACT team come and      talk to him regarding this issue.  2.  Bradycardia, noted on his vital signs, though I did not appreciate that      on my examination.  We will obtain an      EKG just to be sure.  He appears to be asymptomatic with that respect.  3.  He is on IV fluids, which should be continued for now.  Proton pump      inhibitor should be continued, as well.  He is on DVT prophylaxis, as      well.      Osvaldo Shipper, MD  Electronically Signed     GK/MEDQ  D:  02/19/2006  T:  02/19/2006  Job:  [id number]   cc:   R. Roetta Sessions, M.D.  [address]  [place]  [place] [address]
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
## 2011-02-17 NOTE — Discharge Summary (Signed)
   [name], [name]                        ACCOUNT NO.:  [id number]   MEDICAL RECORD NO.:  [id number]                   PATIENT TYPE:  INP   LOCATION:  A217                                 FACILITY:  [facility]   PHYSICIAN:  Dirk Dress. Katrinka Blazing, M.D.                DATE OF BIRTH:  1955-11-15   DATE OF ADMISSION:  04/22/2003  DATE OF DISCHARGE:  04/24/2003                                 DISCHARGE SUMMARY   DISCHARGE DIAGNOSES:  1. Severe hemorrhagic gastritis.  2. Distal esophageal ulcer.  3. Gastroesophageal reflux disease.  4. Anxiety disorder.  5. Chronic history of drug and alcohol abuse.   SPECIAL PROCEDURE:  Esophagogastroduodenoscopy - April 24, 2003.   DISPOSITION:  Patient discharged home in stable condition.   FOLLOW UP:  He will have routine follow up in the office in about one month.   DISCHARGE MEDICATIONS:  1. Reglan 10 mg a.c. and h.s.  2. Xanax 1 mg t.i.d.   HOSPITAL COURSE:  A 55 year old male who presents with a four-day history of  recurrent abdominal pain, nausea, vomiting.  The vomiting was intractable.  He had diffuse abdominal pain, and he had emesis with solids and liquids.  He also had diarrhea.  He was treated in the emergency room and did not  improve.  Amylase and lipase were normal.  It was suspected that he had  hemorrhagic gastritis due to alcoholism.  Patient was admitted and started  on IV fluids, IV Protonix.  He was given antiemetics, Reglan and analgesics.  He had some gradual mild improvement after admission.   Esophagogastroduodenoscopy was done on April 24, 2003, and this showed  irregular superficial ulcer of the GE junction with diffuse redness,  friability, and thickening of the entire stomach with a few punctate  superficial erosions and minimal inflammation of the duodenal bulb.  Patient  was stable after EGD.   DIET:  He tolerated a regular diet.    SPECIAL INSTRUCTIONS:  He was advised about the need to discontinue alcohol   intake.   CONDITION ON DISCHARGE:  Was discharged home in stable satisfactory  condition.    ___________________________________________                                         Dirk Dress Katrinka Blazing, M.D.   LCS/MEDQ  D:  07/25/2003  T:  07/26/2003  Job:  [id number]
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
## 2011-04-03 ENCOUNTER — Emergency Department (HOSPITAL_COMMUNITY): Payer: Self-pay

## 2011-04-03 ENCOUNTER — Emergency Department (HOSPITAL_COMMUNITY)
Admission: EM | Admit: 2011-04-03 | Discharge: 2011-04-03 | Disposition: A | Discharge status: Home / Self Care | Payer: Self-pay | Attending: Emergency Medicine | Admitting: Emergency Medicine

## 2011-04-03 DIAGNOSIS — T148XXA Other injury of unspecified body region, initial encounter: Secondary | ICD-10-CM | POA: Insufficient documentation

## 2011-04-03 DIAGNOSIS — R109 Unspecified abdominal pain: Secondary | ICD-10-CM | POA: Insufficient documentation

## 2011-04-03 DIAGNOSIS — M25529 Pain in unspecified elbow: Secondary | ICD-10-CM | POA: Insufficient documentation

## 2011-04-03 DIAGNOSIS — S2239XA Fracture of one rib, unspecified side, initial encounter for closed fracture: Secondary | ICD-10-CM | POA: Insufficient documentation

## 2011-04-03 DIAGNOSIS — R079 Chest pain, unspecified: Secondary | ICD-10-CM | POA: Insufficient documentation

## 2011-04-03 LAB — POCT I-STAT, CHEM 8
BUN: 10 mg/dL (ref 6–23)
Calcium, Ion: 1.05 mmol/L — ABNORMAL LOW (ref 1.12–1.32)
Chloride: 107 mEq/L (ref 96–112)
Potassium: 3.9 mEq/L (ref 3.5–5.1)
Sodium: 138 mEq/L (ref 135–145)

## 2011-04-03 LAB — DIFFERENTIAL
Lymphocytes Relative: 38 % (ref 12–46)
Lymphs Abs: 4.1 10*3/uL — ABNORMAL HIGH (ref 0.7–4.0)
Monocytes Relative: 10 % (ref 3–12)
Neutro Abs: 5.3 10*3/uL (ref 1.7–7.7)
Neutrophils Relative %: 50 % (ref 43–77)

## 2011-04-03 LAB — CBC
HCT: 43 % (ref 39.0–52.0)
Hemoglobin: 15.3 g/dL (ref 13.0–17.0)
MCH: 31.8 pg (ref 26.0–34.0)
MCV: 89.4 fL (ref 78.0–100.0)
RBC: 4.81 MIL/uL (ref 4.22–5.81)

## 2011-04-03 MED ORDER — IOHEXOL 300 MG/ML  SOLN
100.0000 mL | Freq: Once | INTRAMUSCULAR | Status: AC | PRN
  Administered 2011-04-03: 100 mL via INTRAVENOUS

## 2011-04-10 ENCOUNTER — Emergency Department (HOSPITAL_COMMUNITY): Payer: Self-pay

## 2011-04-10 ENCOUNTER — Emergency Department (HOSPITAL_COMMUNITY)
Admission: EM | Admit: 2011-04-10 | Discharge: 2011-04-10 | Disposition: A | Discharge status: Home / Self Care | Payer: Self-pay | Attending: Emergency Medicine | Admitting: Emergency Medicine

## 2011-04-10 DIAGNOSIS — R071 Chest pain on breathing: Secondary | ICD-10-CM | POA: Insufficient documentation

## 2011-04-10 DIAGNOSIS — F17200 Nicotine dependence, unspecified, uncomplicated: Secondary | ICD-10-CM | POA: Insufficient documentation

## 2011-06-28 LAB — DIFFERENTIAL
Basophils Absolute: 0.2 — ABNORMAL HIGH
Basophils Relative: 2 — ABNORMAL HIGH
Eosinophils Absolute: 0.1
Eosinophils Relative: 2
Lymphs Abs: 4.4 — ABNORMAL HIGH
Neutrophils Relative %: 29 — ABNORMAL LOW

## 2011-06-28 LAB — COMPREHENSIVE METABOLIC PANEL
ALT: 195 — ABNORMAL HIGH
AST: 139 — ABNORMAL HIGH
CO2: 24
Calcium: 9.2
Chloride: 107
Creatinine, Ser: 0.94
GFR calc non Af Amer: >60
Glucose, Bld: 93
Sodium: 141
Total Bilirubin: 0.7

## 2011-06-28 LAB — URINALYSIS, ROUTINE W REFLEX MICROSCOPIC
Bilirubin Urine: NEGATIVE
Glucose, UA: NEGATIVE
Ketones, ur: NEGATIVE
Specific Gravity, Urine: 1.006
pH: 5.5

## 2011-06-28 LAB — CBC
Hemoglobin: 15.1
MCHC: 34.9
MCV: 97.2
RBC: 4.44
WBC: 7.8

## 2011-06-28 LAB — OCCULT BLOOD X 1 CARD TO LAB, STOOL: Fecal Occult Bld: NEGATIVE

## 2011-06-28 LAB — LIPASE, BLOOD: Lipase: 31

## 2011-06-30 LAB — BASIC METABOLIC PANEL
BUN: 11
CO2: 29
Chloride: 102
Creatinine, Ser: 0.93
Glucose, Bld: 96

## 2011-06-30 LAB — CBC
HCT: 47.4
HCT: 40.3
Hemoglobin: 16
MCHC: 33.8
MCHC: 33.8
MCHC: 33.5
MCHC: 33.8
MCHC: 33.9
MCV: 99.3
MCV: 99.1
MCV: 99.6
Platelets: 280
Platelets: 264
Platelets: 274
RBC: 4.55
RBC: 4.75
RBC: 3.99 — ABNORMAL LOW
RDW: 12.7
RDW: 12.8
RDW: 12.4
RDW: 12.5
RDW: 12.8
WBC: 10.1

## 2011-06-30 LAB — COMPREHENSIVE METABOLIC PANEL
ALT: 140 — ABNORMAL HIGH
ALT: 134 — ABNORMAL HIGH
AST: 96 — ABNORMAL HIGH
Albumin: 3.5
BUN: 8
BUN: 9
CO2: 22
Calcium: 9.2
Calcium: 8.8
Calcium: 9
Chloride: 106
Creatinine, Ser: 0.94
Creatinine, Ser: 0.99
GFR calc Af Amer: >60
GFR calc Af Amer: >60
GFR calc non Af Amer: >60
Glucose, Bld: 93
Potassium: 4.2
Sodium: 140
Sodium: 138
Total Bilirubin: 1
Total Protein: 7.7
Total Protein: 6.5

## 2011-06-30 LAB — RAPID URINE DRUG SCREEN, HOSP PERFORMED
Barbiturates: NOT DETECTED
Barbiturates: POSITIVE — AB
Benzodiazepines: POSITIVE — AB
Cocaine: NOT DETECTED
Cocaine: NOT DETECTED
Opiates: NOT DETECTED
Opiates: POSITIVE — AB

## 2011-06-30 LAB — DIFFERENTIAL
Basophils Absolute: 0.1
Basophils Absolute: 0.1
Basophils Relative: 1
Basophils Relative: 2 — ABNORMAL HIGH
Eosinophils Relative: 1
Eosinophils Relative: 1
Lymphocytes Relative: 28
Lymphocytes Relative: 61 — ABNORMAL HIGH
Monocytes Absolute: 0.6
Monocytes Absolute: 1.5 — ABNORMAL HIGH
Monocytes Relative: 14 — ABNORMAL HIGH
Neutrophils Relative %: 28 — ABNORMAL LOW

## 2011-06-30 LAB — HEPATIC FUNCTION PANEL
ALT: 120 — ABNORMAL HIGH
AST: 70 — ABNORMAL HIGH
Bilirubin, Direct: 0.2
Indirect Bilirubin: 1 — ABNORMAL HIGH
Total Bilirubin: 1.2

## 2011-06-30 LAB — POCT I-STAT, CHEM 8
Creatinine, Ser: 1
Glucose, Bld: 107 — ABNORMAL HIGH
HCT: 51
Hemoglobin: 17.3 — ABNORMAL HIGH
Potassium: 3.5
TCO2: 24

## 2011-06-30 LAB — TSH: TSH: 6.723 — ABNORMAL HIGH

## 2011-06-30 LAB — MAGNESIUM: Magnesium: 1.9

## 2011-06-30 LAB — LIPASE, BLOOD
Lipase: 31
Lipase: 41

## 2011-06-30 LAB — URINALYSIS, ROUTINE W REFLEX MICROSCOPIC
Hgb urine dipstick: NEGATIVE
Ketones, ur: 15 — AB
Nitrite: NEGATIVE
Protein, ur: 30 — AB
Specific Gravity, Urine: 1.039 — ABNORMAL HIGH
Urobilinogen, UA: 0.2

## 2011-06-30 LAB — TROPONIN I: Troponin I: <0.01

## 2011-06-30 LAB — T4, FREE: Free T4: 0.94

## 2011-06-30 LAB — CK TOTAL AND CKMB (NOT AT ARMC)
CK, MB: 3.3
Total CK: 238 — ABNORMAL HIGH

## 2011-06-30 LAB — URINE MICROSCOPIC-ADD ON

## 2011-07-03 LAB — COMPREHENSIVE METABOLIC PANEL
ALT: 215 — ABNORMAL HIGH
ALT: 146 — ABNORMAL HIGH
ALT: 79 — ABNORMAL HIGH
ALT: 94 — ABNORMAL HIGH
AST: 104 — ABNORMAL HIGH
AST: 48 — ABNORMAL HIGH
AST: 69 — ABNORMAL HIGH
Albumin: 4.3
Albumin: 4.5
Alkaline Phosphatase: 72
CO2: 24
CO2: 27
Calcium: 9.3
Calcium: 8.7
Calcium: 9
Calcium: 9.1
Chloride: 104
Creatinine, Ser: 0.76
Creatinine, Ser: 0.77
Creatinine, Ser: 0.79
GFR calc Af Amer: >60
GFR calc Af Amer: >60
GFR calc Af Amer: >60
GFR calc Af Amer: >60
GFR calc non Af Amer: >60
GFR calc non Af Amer: >60
Glucose, Bld: 84
Glucose, Bld: 119 — ABNORMAL HIGH
Glucose, Bld: 133 — ABNORMAL HIGH
Glucose, Bld: 93
Potassium: 3.6
Sodium: 141
Sodium: 135
Sodium: 137
Total Bilirubin: 0.9
Total Protein: 7.4
Total Protein: 8.4 — ABNORMAL HIGH

## 2011-07-03 LAB — APTT: aPTT: 26

## 2011-07-03 LAB — LIPASE, BLOOD
Lipase: 21
Lipase: 26
Lipase: 26

## 2011-07-03 LAB — CBC
Hemoglobin: 14
Hemoglobin: 12.7 — ABNORMAL LOW
Hemoglobin: 15.9
MCHC: 34.7
MCHC: 33.2
MCHC: 33.6
MCHC: 33.7
MCV: 100
MCV: 100.4 — ABNORMAL HIGH
MCV: 99.6
Platelets: 322
RBC: 3.81 — ABNORMAL LOW
RBC: 4.12 — ABNORMAL LOW
RBC: 4.76
RDW: 13.1
RDW: 13.7
WBC: 8.3

## 2011-07-03 LAB — DIFFERENTIAL
Basophils Absolute: 0.2 — ABNORMAL HIGH
Basophils Absolute: 0.2 — ABNORMAL HIGH
Eosinophils Absolute: 0
Eosinophils Relative: 3
Eosinophils Relative: 2
Lymphocytes Relative: 45
Lymphocytes Relative: 22
Lymphocytes Relative: 35
Lymphocytes Relative: 44
Lymphs Abs: 4.3 — ABNORMAL HIGH
Lymphs Abs: 1.4
Lymphs Abs: 2.8
Lymphs Abs: 3.6
Monocytes Absolute: 1.3 — ABNORMAL HIGH
Neutro Abs: 3.9
Neutrophils Relative %: 41 — ABNORMAL LOW
Neutrophils Relative %: 45
Neutrophils Relative %: 65

## 2011-07-03 LAB — URINALYSIS, ROUTINE W REFLEX MICROSCOPIC
Bilirubin Urine: NEGATIVE
Glucose, UA: NEGATIVE
Glucose, UA: NEGATIVE
Hgb urine dipstick: NEGATIVE
Ketones, ur: 15 — AB
Leukocytes, UA: NEGATIVE
Nitrite: NEGATIVE
Nitrite: NEGATIVE
Protein, ur: NEGATIVE
Specific Gravity, Urine: 1.005
Specific Gravity, Urine: 1.018
Specific Gravity, Urine: 1.026
Specific Gravity, Urine: 1.031 — ABNORMAL HIGH
Urobilinogen, UA: 0.2
Urobilinogen, UA: 1
pH: 6
pH: 5.5
pH: 6.5
pH: 7

## 2011-07-03 LAB — PROTIME-INR
INR: 0.9
Prothrombin Time: 12.1

## 2011-07-03 LAB — URINE CULTURE: Culture: NO GROWTH

## 2011-07-03 LAB — URINE MICROSCOPIC-ADD ON

## 2011-07-03 LAB — ETHANOL: Alcohol, Ethyl (B): <5

## 2011-07-03 LAB — RAPID URINE DRUG SCREEN, HOSP PERFORMED: Barbiturates: NOT DETECTED

## 2011-07-05 LAB — COMPREHENSIVE METABOLIC PANEL
ALT: 87 — ABNORMAL HIGH
AST: 55 — ABNORMAL HIGH
Albumin: 3.5
BUN: 11
BUN: 2 — ABNORMAL LOW
CO2: 29
CO2: 29
CO2: 30
Calcium: 9.3
Calcium: 9.8
Chloride: 102
Chloride: 103
Chloride: 99
Creatinine, Ser: 0.85
Creatinine, Ser: 0.9
Creatinine, Ser: 1.06
GFR calc Af Amer: >60
GFR calc Af Amer: >60
GFR calc non Af Amer: >60
GFR calc non Af Amer: >60
GFR calc non Af Amer: >60
Glucose, Bld: 95
Sodium: 139
Total Bilirubin: 0.4
Total Bilirubin: 0.8
Total Bilirubin: 1

## 2011-07-05 LAB — CBC
HCT: 41
HCT: 46.6
Hemoglobin: 13.9
MCHC: 33.5
MCHC: 33.8
MCHC: 34.1
MCV: 99.2
MCV: 99.6
Platelets: 281
RBC: 4.12 — ABNORMAL LOW
RBC: 4.33
RBC: 4.34
RBC: 4.7
WBC: 7.5
WBC: 7.8
WBC: 7.9

## 2011-07-05 LAB — CLOSTRIDIUM DIFFICILE EIA: C difficile Toxins A+B, EIA: NEGATIVE

## 2011-07-05 LAB — STOOL CULTURE

## 2011-07-05 LAB — GLIADIN ANTIBODIES, SERUM
Gliadin IgA: 1.1 U/mL (ref <7)
Gliadin IgG: 0.1 U/mL (ref <7)

## 2011-07-05 LAB — BASIC METABOLIC PANEL
CO2: 30
Calcium: 9.5
Creatinine, Ser: 0.95
GFR calc Af Amer: >60
GFR calc non Af Amer: >60
Sodium: 137

## 2011-07-05 LAB — HEPATITIS B SURFACE ANTIGEN: Hepatitis B Surface Ag: NEGATIVE

## 2011-07-05 LAB — FECAL LACTOFERRIN, QUANT

## 2011-07-05 LAB — HEPATITIS C ANTIBODY: HCV Ab: REACTIVE — AB

## 2011-07-14 LAB — COMPREHENSIVE METABOLIC PANEL
ALT: 64 — ABNORMAL HIGH
AST: 44 — ABNORMAL HIGH
Alkaline Phosphatase: 98
CO2: 28
Calcium: 9.2
Chloride: 103
GFR calc Af Amer: >60
GFR calc non Af Amer: >60
Glucose, Bld: 101 — ABNORMAL HIGH
Sodium: 136
Total Bilirubin: 0.4

## 2011-07-14 LAB — CBC
Hemoglobin: 16.3
MCHC: 34
RBC: 4.96
WBC: 10.1

## 2011-07-14 LAB — URINALYSIS, ROUTINE W REFLEX MICROSCOPIC
Glucose, UA: NEGATIVE
Nitrite: NEGATIVE
Protein, ur: NEGATIVE
Specific Gravity, Urine: 1.01
Urobilinogen, UA: 0.2

## 2011-07-14 LAB — DIFFERENTIAL
Basophils Relative: 1
Eosinophils Absolute: 0.3
Eosinophils Relative: 3
Lymphs Abs: 4.8 — ABNORMAL HIGH

## 2011-07-14 LAB — AMYLASE: Amylase: 99

## 2012-06-26 ENCOUNTER — Emergency Department (HOSPITAL_COMMUNITY): Payer: Self-pay

## 2012-06-26 ENCOUNTER — Inpatient Hospital Stay (HOSPITAL_COMMUNITY)
Admission: EM | Admit: 2012-06-26 | Discharge: 2012-07-02 | DRG: 563 | Disposition: A | Discharge status: Home / Self Care | Payer: No Typology Code available for payment source | Attending: Surgery | Admitting: Surgery

## 2012-06-26 ENCOUNTER — Encounter (HOSPITAL_COMMUNITY): Payer: Self-pay | Admitting: *Deleted

## 2012-06-26 DIAGNOSIS — IMO0002 Reserved for concepts with insufficient information to code with codable children: Secondary | ICD-10-CM | POA: Diagnosis present

## 2012-06-26 DIAGNOSIS — S42109A Fracture of unspecified part of scapula, unspecified shoulder, initial encounter for closed fracture: Secondary | ICD-10-CM

## 2012-06-26 DIAGNOSIS — R338 Other retention of urine: Secondary | ICD-10-CM | POA: Diagnosis present

## 2012-06-26 DIAGNOSIS — S42002A Fracture of unspecified part of left clavicle, initial encounter for closed fracture: Secondary | ICD-10-CM | POA: Diagnosis present

## 2012-06-26 DIAGNOSIS — S42009A Fracture of unspecified part of unspecified clavicle, initial encounter for closed fracture: Secondary | ICD-10-CM

## 2012-06-26 DIAGNOSIS — S2249XA Multiple fractures of ribs, unspecified side, initial encounter for closed fracture: Secondary | ICD-10-CM

## 2012-06-26 DIAGNOSIS — R1310 Dysphagia, unspecified: Secondary | ICD-10-CM | POA: Diagnosis not present

## 2012-06-26 DIAGNOSIS — S060XAA Concussion with loss of consciousness status unknown, initial encounter: Secondary | ICD-10-CM | POA: Diagnosis present

## 2012-06-26 DIAGNOSIS — T07XXXA Unspecified multiple injuries, initial encounter: Secondary | ICD-10-CM | POA: Diagnosis present

## 2012-06-26 DIAGNOSIS — D62 Acute posthemorrhagic anemia: Secondary | ICD-10-CM | POA: Diagnosis not present

## 2012-06-26 DIAGNOSIS — R339 Retention of urine, unspecified: Secondary | ICD-10-CM | POA: Diagnosis present

## 2012-06-26 DIAGNOSIS — S060X9A Concussion with loss of consciousness of unspecified duration, initial encounter: Secondary | ICD-10-CM | POA: Diagnosis present

## 2012-06-26 LAB — COMPREHENSIVE METABOLIC PANEL
ALT: 46 U/L (ref 0–53)
Albumin: 3.8 g/dL (ref 3.5–5.2)
BUN: 17 mg/dL (ref 6–23)
Calcium: 9.6 mg/dL (ref 8.4–10.5)
GFR calc Af Amer: >90 mL/min (ref >90)
Glucose, Bld: 114 mg/dL — ABNORMAL HIGH (ref 70–99)
Sodium: 136 mEq/L (ref 135–145)
Total Protein: 7.6 g/dL (ref 6.0–8.3)

## 2012-06-26 LAB — CBC
HCT: 40.5 % (ref 39.0–52.0)
Hemoglobin: 13.9 g/dL (ref 13.0–17.0)
WBC: 15.7 10*3/uL — ABNORMAL HIGH (ref 4.0–10.5)

## 2012-06-26 LAB — PROTIME-INR
INR: 0.92 (ref 0.00–1.49)
Prothrombin Time: 12.3 seconds (ref 11.6–15.2)

## 2012-06-26 LAB — LACTIC ACID, PLASMA: Lactic Acid, Venous: 1.9 mmol/L (ref 0.5–2.2)

## 2012-06-26 MED ORDER — ONDANSETRON HCL 4 MG PO TABS: 4.0000 mg | ORAL_TABLET | Freq: Four times a day (QID) | ORAL | Status: DC | PRN

## 2012-06-26 MED ORDER — HYDROMORPHONE 0.3 MG/ML IV SOLN
INTRAVENOUS | Status: DC
  Administered 2012-06-26: 23:00:00 via INTRAVENOUS
  Administered 2012-06-27: 3.6 mg via INTRAVENOUS
  Administered 2012-06-27: 07:00:00 via INTRAVENOUS
  Administered 2012-06-27: 1.5 mg via INTRAVENOUS
  Administered 2012-06-27: 4.8 mg via INTRAVENOUS
  Administered 2012-06-27: 3.3 mg via INTRAVENOUS
  Administered 2012-06-27: 16:00:00 via INTRAVENOUS
  Administered 2012-06-27: 3.6 mg via INTRAVENOUS
  Administered 2012-06-27: 2.1 mg via INTRAVENOUS
  Administered 2012-06-27: 1.95 mg via INTRAVENOUS
  Administered 2012-06-28: 0.6 mg via INTRAVENOUS
  Administered 2012-06-28: 2.92 mg via INTRAVENOUS
  Administered 2012-06-28: 2.1 mg via INTRAVENOUS
  Administered 2012-06-28: 0.9 mg via INTRAVENOUS
  Administered 2012-06-28: 01:00:00 via INTRAVENOUS
  Administered 2012-06-28: 1.5 mg via INTRAVENOUS
  Administered 2012-06-29: 1.2 mg via INTRAVENOUS
  Administered 2012-06-29: 0.9 mg via INTRAVENOUS
  Administered 2012-06-29: 0.769 mg via INTRAVENOUS
  Administered 2012-06-29: 0.9 mg via INTRAVENOUS
  Administered 2012-06-29: 0.84 mg via INTRAVENOUS
  Administered 2012-06-30: 1.8 mg via INTRAVENOUS
  Administered 2012-06-30: 2.1 mg via INTRAVENOUS
  Administered 2012-06-30: 18:00:00 via INTRAVENOUS
  Administered 2012-06-30: 1.8 mg via INTRAVENOUS
  Administered 2012-06-30: 1.5 mg via INTRAVENOUS
  Administered 2012-06-30: 01:00:00 via INTRAVENOUS
  Administered 2012-06-30: 1.8 mg via INTRAVENOUS
  Administered 2012-07-01: 2.4 mg via INTRAVENOUS
  Administered 2012-07-01: 3 mg via INTRAVENOUS
  Administered 2012-07-01: 2.1 mg via INTRAVENOUS
  Administered 2012-07-01: 07:00:00 via INTRAVENOUS
  Filled 2012-06-26 (×8): qty 25

## 2012-06-26 MED ORDER — HYDROMORPHONE HCL PF 1 MG/ML IJ SOLN
INTRAMUSCULAR | Status: AC
  Filled 2012-06-26: qty 1

## 2012-06-26 MED ORDER — TETANUS-DIPHTH-ACELL PERTUSSIS 5-2.5-18.5 LF-MCG/0.5 IM SUSP
0.5000 mL | Freq: Once | INTRAMUSCULAR | Status: AC
  Administered 2012-06-26: 0.5 mL via INTRAMUSCULAR
  Filled 2012-06-26: qty 0.5

## 2012-06-26 MED ORDER — CEFAZOLIN SODIUM-DEXTROSE 2-3 GM-% IV SOLR
2.0000 g | Freq: Once | INTRAVENOUS | Status: AC
  Administered 2012-06-26: 2 g via INTRAVENOUS

## 2012-06-26 MED ORDER — DIPHENHYDRAMINE HCL 12.5 MG/5ML PO ELIX
12.5000 mg | ORAL_SOLUTION | Freq: Four times a day (QID) | ORAL | Status: DC | PRN
  Filled 2012-06-26: qty 5

## 2012-06-26 MED ORDER — HYDROMORPHONE HCL PF 1 MG/ML IJ SOLN
1.0000 mg | Freq: Once | INTRAMUSCULAR | Status: AC
  Administered 2012-06-26: 1 mg via INTRAVENOUS

## 2012-06-26 MED ORDER — PANTOPRAZOLE SODIUM 40 MG IV SOLR
40.0000 mg | Freq: Every day | INTRAVENOUS | Status: DC
  Filled 2012-06-26 (×2): qty 40

## 2012-06-26 MED ORDER — ONDANSETRON HCL 4 MG/2ML IJ SOLN
4.0000 mg | Freq: Four times a day (QID) | INTRAMUSCULAR | Status: DC | PRN
  Administered 2012-06-27 – 2012-07-01 (×3): 4 mg via INTRAVENOUS
  Filled 2012-06-26 (×3): qty 2

## 2012-06-26 MED ORDER — DOCUSATE SODIUM 100 MG PO CAPS
100.0000 mg | ORAL_CAPSULE | Freq: Two times a day (BID) | ORAL | Status: DC
  Administered 2012-06-26 – 2012-07-01 (×11): 100 mg via ORAL
  Filled 2012-06-26 (×11): qty 1

## 2012-06-26 MED ORDER — IOHEXOL 300 MG/ML  SOLN
100.0000 mL | Freq: Once | INTRAMUSCULAR | Status: AC | PRN
  Administered 2012-06-26: 100 mL via INTRAVENOUS

## 2012-06-26 MED ORDER — CEFAZOLIN SODIUM 1-5 GM-% IV SOLN: 1.0000 g | Freq: Once | INTRAVENOUS | Status: DC

## 2012-06-26 MED ORDER — ONDANSETRON HCL 4 MG/2ML IJ SOLN: 4.0000 mg | Freq: Four times a day (QID) | INTRAMUSCULAR | Status: DC | PRN

## 2012-06-26 MED ORDER — ENOXAPARIN SODIUM 40 MG/0.4ML ~~LOC~~ SOLN
40.0000 mg | SUBCUTANEOUS | Status: DC
  Administered 2012-06-27 – 2012-07-02 (×6): 40 mg via SUBCUTANEOUS
  Filled 2012-06-26 (×7): qty 0.4

## 2012-06-26 MED ORDER — SODIUM CHLORIDE 0.9 % IJ SOLN: 9.0000 mL | INTRAMUSCULAR | Status: DC | PRN

## 2012-06-26 MED ORDER — OXYCODONE HCL 5 MG PO TABS: 5.0000 mg | ORAL_TABLET | ORAL | Status: DC | PRN

## 2012-06-26 MED ORDER — OXYCODONE HCL 5 MG PO TABS: 2.5000 mg | ORAL_TABLET | ORAL | Status: DC | PRN

## 2012-06-26 MED ORDER — ONDANSETRON HCL 4 MG/2ML IJ SOLN
4.0000 mg | Freq: Once | INTRAMUSCULAR | Status: AC
  Administered 2012-06-26: 4 mg via INTRAVENOUS

## 2012-06-26 MED ORDER — BACITRACIN ZINC 500 UNIT/GM EX OINT
TOPICAL_OINTMENT | Freq: Two times a day (BID) | CUTANEOUS | Status: DC
  Administered 2012-06-26 – 2012-06-29 (×7): via TOPICAL
  Administered 2012-06-29: 1 via TOPICAL
  Administered 2012-06-30 – 2012-07-02 (×5): via TOPICAL
  Filled 2012-06-26 (×6): qty 15

## 2012-06-26 MED ORDER — KETOROLAC TROMETHAMINE 30 MG/ML IJ SOLN
30.0000 mg | Freq: Three times a day (TID) | INTRAMUSCULAR | Status: AC | PRN
  Administered 2012-06-26 – 2012-06-27 (×2): 30 mg via INTRAVENOUS
  Filled 2012-06-26 (×2): qty 1

## 2012-06-26 MED ORDER — HYDROMORPHONE HCL PF 1 MG/ML IJ SOLN
1.0000 mg | Freq: Once | INTRAMUSCULAR | Status: AC
  Administered 2012-06-26: 1 mg via INTRAVENOUS
  Filled 2012-06-26: qty 1

## 2012-06-26 MED ORDER — OXYCODONE HCL 5 MG PO TABS
10.0000 mg | ORAL_TABLET | ORAL | Status: DC | PRN
  Administered 2012-06-26 – 2012-07-01 (×12): 10 mg via ORAL
  Filled 2012-06-26 (×12): qty 2

## 2012-06-26 MED ORDER — MORPHINE SULFATE 4 MG/ML IJ SOLN
8.0000 mg | Freq: Once | INTRAMUSCULAR | Status: AC
  Administered 2012-06-26: 8 mg via INTRAVENOUS

## 2012-06-26 MED ORDER — DIPHENHYDRAMINE HCL 50 MG/ML IJ SOLN
12.5000 mg | Freq: Four times a day (QID) | INTRAMUSCULAR | Status: DC | PRN
  Administered 2012-06-27 (×2): 12.5 mg via INTRAVENOUS
  Administered 2012-06-28 (×2): 25 mg via INTRAVENOUS
  Administered 2012-06-28: 12.5 mg via INTRAVENOUS
  Filled 2012-06-26 (×6): qty 1

## 2012-06-26 MED ORDER — PANTOPRAZOLE SODIUM 40 MG PO TBEC
40.0000 mg | DELAYED_RELEASE_TABLET | Freq: Every day | ORAL | Status: DC
  Administered 2012-06-27 – 2012-06-30 (×4): 40 mg via ORAL
  Filled 2012-06-26 (×4): qty 1

## 2012-06-26 MED ORDER — ONDANSETRON HCL 4 MG/2ML IJ SOLN
4.0000 mg | Freq: Once | INTRAMUSCULAR | Status: AC
  Administered 2012-06-26: 4 mg via INTRAVENOUS
  Filled 2012-06-26: qty 2

## 2012-06-26 MED ORDER — POTASSIUM CHLORIDE IN NACL 20-0.9 MEQ/L-% IV SOLN
INTRAVENOUS | Status: DC
  Administered 2012-06-26: 23:00:00 via INTRAVENOUS
  Filled 2012-06-26 (×2): qty 1000

## 2012-06-26 MED ORDER — CEFAZOLIN SODIUM-DEXTROSE 2-3 GM-% IV SOLR
2.0000 g | Freq: Three times a day (TID) | INTRAVENOUS | Status: AC
  Administered 2012-06-26 – 2012-06-27 (×3): 2 g via INTRAVENOUS
  Filled 2012-06-26 (×3): qty 50

## 2012-06-26 MED ORDER — NALOXONE HCL 0.4 MG/ML IJ SOLN: 0.4000 mg | INTRAMUSCULAR | Status: DC | PRN

## 2012-06-26 MED ORDER — CEFAZOLIN SODIUM-DEXTROSE 2-3 GM-% IV SOLR
INTRAVENOUS | Status: AC
  Filled 2012-06-26: qty 50
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
## 2012-06-26 NOTE — ED Notes (Signed)
Pt is aware that we need urine specimen. Urinal provided. Pt unable to go at this time.
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
## 2012-06-26 NOTE — ED Notes (Signed)
Chem 8 results per POCT protocol  Na       141 K          3.7 Cl          106 iCa        1.19 TCO2    22 Glu        110 BUN       18 Crea       1.0 Hct          42 Hb           14.3

## 2012-06-26 NOTE — Progress Notes (Signed)
Chaplain Note:  Chaplain responded immediately to LV2 trauma page received at 17:35. Chaplain provided spiritual comfort, support, and prayer for pt, pt's family, and [facility] staff.  All expressed appreciation for chaplain support.  Chaplain will follow up as needed.   06/26/12 1735  Clinical Encounter Type  Visited With Patient and family together  Visit Type Spiritual support;Trauma  Referral From Other (Comment) (Trauma Page)  Spiritual Encounters  Spiritual Needs Emotional;Prayer  Stress Factors  Patient Stress Factors Health changes;Financial concerns;Family relationships;Major life changes  Family Stress Factors Major life changes    Verdie Shire, [place] [phone]
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
## 2012-06-26 NOTE — ED Notes (Signed)
Patient transported to CT 

## 2012-06-26 NOTE — ED Notes (Signed)
Pt oxygen sats at 92%. Pt does not want oxygen at this time.

## 2012-06-27 ENCOUNTER — Inpatient Hospital Stay (HOSPITAL_COMMUNITY): Payer: Self-pay

## 2012-06-27 LAB — URINALYSIS, MICROSCOPIC ONLY
Glucose, UA: 100 mg/dL — AB
Hgb urine dipstick: NEGATIVE
Specific Gravity, Urine: >1.046 — ABNORMAL HIGH (ref 1.005–1.030)
pH: 5.5 (ref 5.0–8.0)

## 2012-06-27 LAB — POCT I-STAT, CHEM 8
Calcium, Ion: 1.19 mmol/L (ref 1.12–1.23)
Chloride: 106 mEq/L (ref 96–112)
HCT: 42 % (ref 39.0–52.0)
Potassium: 3.7 mEq/L (ref 3.5–5.1)
Sodium: 141 mEq/L (ref 135–145)

## 2012-06-27 LAB — BASIC METABOLIC PANEL
BUN: 16 mg/dL (ref 6–23)
CO2: 21 mEq/L (ref 19–32)
Calcium: 9.2 mg/dL (ref 8.4–10.5)
Creatinine, Ser: 0.8 mg/dL (ref 0.50–1.35)
GFR calc non Af Amer: >90 mL/min (ref >90)
Glucose, Bld: 151 mg/dL — ABNORMAL HIGH (ref 70–99)
Sodium: 135 mEq/L (ref 135–145)

## 2012-06-27 LAB — CBC
HCT: 38.8 % — ABNORMAL LOW (ref 39.0–52.0)
Hemoglobin: 12.9 g/dL — ABNORMAL LOW (ref 13.0–17.0)
MCH: 30.2 pg (ref 26.0–34.0)
MCHC: 33.2 g/dL (ref 30.0–36.0)
MCV: 90.9 fL (ref 78.0–100.0)
RBC: 4.27 MIL/uL (ref 4.22–5.81)

## 2012-06-27 MED ORDER — SODIUM CHLORIDE 0.9 % IV SOLN
INTRAVENOUS | Status: DC
  Filled 2012-06-27 (×2): qty 1000

## 2012-06-27 MED ORDER — IPRATROPIUM-ALBUTEROL 18-103 MCG/ACT IN AERO
2.0000 | INHALATION_SPRAY | Freq: Four times a day (QID) | RESPIRATORY_TRACT | Status: DC | PRN
  Filled 2012-06-27: qty 14.7

## 2012-06-27 MED ORDER — SODIUM CHLORIDE 0.9 % IV SOLN
INTRAVENOUS | Status: DC
  Administered 2012-06-27 – 2012-06-30 (×4): via INTRAVENOUS

## 2012-06-27 MED ORDER — BETHANECHOL CHLORIDE 25 MG PO TABS
25.0000 mg | ORAL_TABLET | Freq: Three times a day (TID) | ORAL | Status: DC
  Administered 2012-06-27 – 2012-06-30 (×12): 25 mg via ORAL
  Filled 2012-06-27 (×15): qty 1

## 2012-06-27 NOTE — Progress Notes (Signed)
Subjective: Patient reports significant left shoulder pain.   Objective: Vital signs in last 24 hours: Temp:  [97.5 F (36.4 C)-98.1 F (36.7 C)] 97.9 F (36.6 C) (09/26 0400) Pulse Rate:  [70-95] 78  (09/26 0700) Resp:  [8-22] 18  (09/26 0700) BP: (103-153)/(56-83) 106/68 mmHg (09/26 0700) SpO2:  [94 %-98 %] 97 % (09/26 0700) FiO2 (%):  [28 %] 28 % (09/25 2208)  Intake/Output from previous day: 09/25 0701 - 09/26 0700 In: 947 [P.O.:240; I.V.:607; IV Piggyback:100] Out: 1 [Urine:1] Intake/Output this shift:     Basename 06/27/12 0530 06/26/12 1755  HGB 12.9* 13.9    Basename 06/27/12 0530 06/26/12 1755  WBC 12.5* 15.7*  RBC 4.27 4.51  HCT 38.8* 40.5  PLT 302 311    Basename 06/27/12 0530 06/26/12 1755  NA 135 136  K 5.2* 3.8  CL 105 102  CO2 21 21  BUN 16 17  CREATININE 0.80 0.85  GLUCOSE 151* 114*  CALCIUM 9.2 9.6    Basename 06/26/12 1755  LABPT --  INR 0.92    Sensation intact distally Intact pulses distally No skin issues over clavicle fracture. Sling in place Xeroform over abrasions  Assessment/Plan: Continue sling for left clavicle and left scapula fractures.   Heather Streeper Y 06/27/2012, 7:08 AM

## 2012-06-27 NOTE — Significant Event (Signed)
0950am-Pt has been unable to void since arrival to unit; multiple methods used to aid in voiding. MD Janee Morn made aware. Foley catheter (#[id number]) inserted using sterile techniques per protocol. Will monitor. Armoni Kludt, Charity fundraiser.
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
## 2012-06-27 NOTE — Progress Notes (Signed)
SBIRT complete.  Pt recently released from prison where he was serving time for ETOH related offenses.  He attended ETOH/drug classes in prison and has been clean for 3.5-4 years.  He maintains that he has not had a drink since he got let out on/about 06/06/12.

## 2012-06-27 NOTE — Progress Notes (Signed)
Chaplain visited patient in response to On Call Chaplain referral. Patient was awake, alert and responsive but seems to be in pain from lacerations. Chaplain shared words of comfort and encouragement with patient. Patient expressed words of hope and was thankful for Chaplain's visit. Chaplain prayed for patient and will continue to provide spiritual care to patient as needed at a later time.
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
## 2012-06-28 ENCOUNTER — Inpatient Hospital Stay (HOSPITAL_COMMUNITY): Payer: Self-pay

## 2012-06-28 LAB — BASIC METABOLIC PANEL
CO2: 23 mEq/L (ref 19–32)
Calcium: 9.1 mg/dL (ref 8.4–10.5)
Potassium: 4.3 mEq/L (ref 3.5–5.1)
Sodium: 137 mEq/L (ref 135–145)

## 2012-06-28 LAB — CBC
MCV: 91.6 fL (ref 78.0–100.0)
Platelets: 217 10*3/uL (ref 150–400)
RBC: 3.82 MIL/uL — ABNORMAL LOW (ref 4.22–5.81)
WBC: 14.7 10*3/uL — ABNORMAL HIGH (ref 4.0–10.5)

## 2012-06-28 MED ORDER — LORAZEPAM 1 MG PO TABS
0.0000 mg | ORAL_TABLET | Freq: Four times a day (QID) | ORAL | Status: AC
  Administered 2012-06-28 (×2): 1 mg via ORAL
  Administered 2012-06-29 (×2): 2 mg via ORAL
  Filled 2012-06-28: qty 2
  Filled 2012-06-28: qty 1

## 2012-06-28 MED ORDER — ADULT MULTIVITAMIN W/MINERALS CH
1.0000 | ORAL_TABLET | Freq: Every day | ORAL | Status: DC
  Administered 2012-06-28 – 2012-07-02 (×5): 1 via ORAL
  Filled 2012-06-28 (×5): qty 1

## 2012-06-28 MED ORDER — LORAZEPAM 2 MG/ML IJ SOLN: 1.0000 mg | Freq: Four times a day (QID) | INTRAMUSCULAR | Status: AC | PRN

## 2012-06-28 MED ORDER — THIAMINE HCL 100 MG/ML IJ SOLN
100.0000 mg | Freq: Every day | INTRAMUSCULAR | Status: DC
  Filled 2012-06-28: qty 1

## 2012-06-28 MED ORDER — LORAZEPAM 1 MG PO TABS
0.0000 mg | ORAL_TABLET | Freq: Two times a day (BID) | ORAL | Status: AC
  Administered 2012-06-30 – 2012-07-01 (×2): 1 mg via ORAL
  Filled 2012-06-28 (×3): qty 1

## 2012-06-28 MED ORDER — VITAMIN B-1 100 MG PO TABS
100.0000 mg | ORAL_TABLET | Freq: Every day | ORAL | Status: DC
  Administered 2012-06-28 – 2012-07-02 (×5): 100 mg via ORAL
  Filled 2012-06-28 (×5): qty 1

## 2012-06-28 MED ORDER — FOLIC ACID 1 MG PO TABS
1.0000 mg | ORAL_TABLET | Freq: Every day | ORAL | Status: DC
  Administered 2012-06-28 – 2012-07-02 (×5): 1 mg via ORAL
  Filled 2012-06-28 (×5): qty 1

## 2012-06-28 MED ORDER — LORAZEPAM 1 MG PO TABS
1.0000 mg | ORAL_TABLET | Freq: Four times a day (QID) | ORAL | Status: AC | PRN
  Administered 2012-06-28 (×2): 1 mg via ORAL
  Filled 2012-06-28 (×2): qty 1
  Filled 2012-06-28: qty 2
  Filled 2012-06-28: qty 1

## 2012-06-28 MED ORDER — ALPRAZOLAM 0.25 MG PO TABS
0.2500 mg | ORAL_TABLET | Freq: Three times a day (TID) | ORAL | Status: DC | PRN
  Administered 2012-06-28 – 2012-07-02 (×6): 0.25 mg via ORAL
  Filled 2012-06-28 (×6): qty 1
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
## 2012-06-29 ENCOUNTER — Encounter (HOSPITAL_COMMUNITY): Payer: Self-pay | Admitting: *Deleted

## 2012-06-29 MED ORDER — BISACODYL 5 MG PO TBEC: 5.0000 mg | DELAYED_RELEASE_TABLET | Freq: Every day | ORAL | Status: DC | PRN

## 2012-06-29 MED ORDER — POLYETHYLENE GLYCOL 3350 17 G PO PACK
17.0000 g | PACK | Freq: Every day | ORAL | Status: DC
  Administered 2012-06-29 – 2012-07-02 (×4): 17 g via ORAL
  Filled 2012-06-29 (×4): qty 1
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
## 2012-06-29 NOTE — Progress Notes (Signed)
Received pt from 2300 via wheelchair, oriented to unit.
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
## 2012-06-29 NOTE — Progress Notes (Signed)
Out to floor.  [name]. Corliss Skains, MD, [facility]  06/29/2012 1:16 PM

## 2012-06-30 ENCOUNTER — Encounter (HOSPITAL_COMMUNITY): Payer: Self-pay | Admitting: *Deleted

## 2012-06-30 ENCOUNTER — Inpatient Hospital Stay (HOSPITAL_COMMUNITY): Payer: Self-pay

## 2012-06-30 MED ORDER — KETOROLAC TROMETHAMINE 15 MG/ML IJ SOLN
15.0000 mg | Freq: Four times a day (QID) | INTRAMUSCULAR | Status: DC
  Administered 2012-06-30 – 2012-07-01 (×4): 15 mg via INTRAVENOUS
  Filled 2012-06-30 (×8): qty 1

## 2012-06-30 MED ORDER — NICOTINE 21 MG/24HR TD PT24
21.0000 mg | MEDICATED_PATCH | TRANSDERMAL | Status: DC
  Administered 2012-06-30: 21 mg via TRANSDERMAL
  Filled 2012-06-30 (×2): qty 1

## 2012-06-30 MED ORDER — KETOROLAC TROMETHAMINE 30 MG/ML IJ SOLN
30.0000 mg | Freq: Once | INTRAMUSCULAR | Status: AC
  Administered 2012-06-30: 30 mg via INTRAVENOUS
  Filled 2012-06-30: qty 1

## 2012-06-30 NOTE — Progress Notes (Signed)
<  principal problem not specified>  Assessment: <principal problem not specified> Stable, but still with poor pain control  Plan: Encouraged continued increase in activities. Will adjust pain medications.   Subjective: Main issue seems to be poor pain control. It hurts to cough or take deep breaths. While the pain is a stabbing pain over the left anterior rib cage where his rib fracture. No abdominal complaints. Tolerating diet. Has been up to the bathroom.  Objective: Vital signs in last 24 hours: Temp:  [98.2 F (36.8 C)-98.9 F (37.2 C)] 98.7 F (37.1 C) (09/29 0700) Pulse Rate:  [76-103] 85  (09/29 0700) Resp:  [17-21] 20  (09/29 0700) BP: (124-136)/(62-99) 136/70 mmHg (09/29 0700) SpO2:  [92 %-98 %] 98 % (09/29 0700) Last BM Date: 06/27/12  Intake/Output from previous day: 09/28 0701 - 09/29 0700 In: 1635 [P.O.:620; I.V.:1015] Out: 1275 [Urine:1275] Intake/Output this shift:    General appearance: alert, cooperative, appears older than stated age and mild distress Resp: clear to auscultation bilaterally and doesn't take deep breaths well Cardio: regular rate and rhythm, S1, S2 normal, no murmur, click, rub or gallop GI: soft, non-tender; bowel sounds normal; no masses,  no organomegaly  Lab Results:  No results found for this or any previous visit (from the past 24 hour(s)).   Studies/Results Radiology     MEDS, Scheduled    . bacitracin   Topical BID  . bethanechol  25 mg Oral TID  . docusate sodium  100 mg Oral BID  . enoxaparin  40 mg Subcutaneous Q24H  . folic acid  1 mg Oral Daily  . HYDROmorphone PCA 0.3 mg/mL   Intravenous Q4H  . LORazepam  0-4 mg Oral Q6H   Followed by  . LORazepam  0-4 mg Oral Q12H  . multivitamin with minerals  1 tablet Oral Daily  . pantoprazole  40 mg Oral Q1200  . polyethylene glycol  17 g Oral Daily  . thiamine  100 mg Oral Daily       LOS: 4 days    Currie Paris, MD, [facility],  [place] [phone]   06/30/2012 8:07 AM

## 2012-07-01 DIAGNOSIS — R338 Other retention of urine: Secondary | ICD-10-CM | POA: Diagnosis present

## 2012-07-01 DIAGNOSIS — D62 Acute posthemorrhagic anemia: Secondary | ICD-10-CM | POA: Diagnosis not present

## 2012-07-01 DIAGNOSIS — J939 Pneumothorax, unspecified: Secondary | ICD-10-CM

## 2012-07-01 DIAGNOSIS — S42002A Fracture of unspecified part of left clavicle, initial encounter for closed fracture: Secondary | ICD-10-CM

## 2012-07-01 DIAGNOSIS — S2242XA Multiple fractures of ribs, left side, initial encounter for closed fracture: Secondary | ICD-10-CM

## 2012-07-01 DIAGNOSIS — T07XXXA Unspecified multiple injuries, initial encounter: Secondary | ICD-10-CM | POA: Diagnosis present

## 2012-07-01 HISTORY — DX: Fracture of unspecified part of left clavicle, initial encounter for closed fracture: S42.002A

## 2012-07-01 HISTORY — DX: Multiple fractures of ribs, left side, initial encounter for closed fracture: S22.42XA

## 2012-07-01 HISTORY — DX: Pneumothorax, unspecified: J93.9

## 2012-07-01 MED ORDER — MORPHINE SULFATE ER 15 MG PO TBCR
15.0000 mg | EXTENDED_RELEASE_TABLET | Freq: Three times a day (TID) | ORAL | Status: DC
  Administered 2012-07-01 – 2012-07-02 (×4): 15 mg via ORAL
  Filled 2012-07-01 (×4): qty 1

## 2012-07-01 MED ORDER — HYDROMORPHONE HCL PF 1 MG/ML IJ SOLN
0.5000 mg | INTRAMUSCULAR | Status: DC | PRN
  Administered 2012-07-01 – 2012-07-02 (×4): 0.5 mg via INTRAVENOUS
  Filled 2012-07-01 (×3): qty 1

## 2012-07-01 MED ORDER — OXYCODONE HCL 5 MG PO TABS
10.0000 mg | ORAL_TABLET | ORAL | Status: DC | PRN
  Administered 2012-07-01 – 2012-07-02 (×6): 20 mg via ORAL
  Filled 2012-07-01: qty 4
  Filled 2012-07-01: qty 2
  Filled 2012-07-01 (×3): qty 4
  Filled 2012-07-01: qty 2
  Filled 2012-07-01: qty 4

## 2012-07-01 MED ORDER — BETHANECHOL CHLORIDE 10 MG PO TABS
10.0000 mg | ORAL_TABLET | Freq: Four times a day (QID) | ORAL | Status: DC
  Administered 2012-07-01 (×4): 10 mg via ORAL
  Filled 2012-07-01 (×8): qty 1

## 2012-07-01 MED ORDER — TRAMADOL HCL 50 MG PO TABS
100.0000 mg | ORAL_TABLET | Freq: Four times a day (QID) | ORAL | Status: DC
  Administered 2012-07-01 – 2012-07-02 (×6): 100 mg via ORAL
  Filled 2012-07-01 (×9): qty 2

## 2012-07-01 NOTE — Clinical Social Work Note (Signed)
Clinical Social Worker continuing to follow patient for emotional support and discharge planning needs.  Patient states that he has been staying at the [facility] and should have about 30 more days left there.  Patient is agreeable to return to [facility] with documentation to remain inside during the day.  CSW spoke with Reita Cliche at the [facility] who states that patient is able to return once medically ready and is able to receive home health and the ability to remain inside with a MD note.  CSW will complete letter for patient return and facilitate patient discharge needs once medically stable.  Clinical Social Worker will continue to follow for support and discharge planning needs.  Macario Golds, [place] [phone]
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
## 2012-07-01 NOTE — Progress Notes (Signed)
UR completed 

## 2012-07-01 NOTE — Progress Notes (Signed)
Agree with PT treatment note.  Lavanna Rog, PT DPT [phone]  

## 2012-07-01 NOTE — Progress Notes (Signed)
Physical Therapy Treatment Patient Details Name: [name] MRN: [id number] DOB: 06-12-1956 Today's Date: 07/01/2012 Time: 4098-1191 PT Time Calculation (min): 25 min  PT Assessment / Plan / Recommendation Comments on Treatment Session  Pt desribed feeling pain in L UE and explain that it hurts to cough due to fx ribs.  Pt required cues for safety and after ambulation c/o dizziness.  Pt relined in chair and performed ankle pumps to help relieve sx. Nurse notified and made aware of pt's sx.     Follow Up Recommendations  Home health PT    Barriers to Discharge        Equipment Recommendations  Cane    Recommendations for Other Services OT consult  Frequency Min 4X/week   Plan Discharge plan remains appropriate;Frequency remains appropriate    Precautions / Restrictions Precautions Precautions: Fall Required Braces or Orthoses: Other Brace/Splint Other Brace/Splint: sling on lt arm   Pertinent Vitals/Pain Pain about 6/10 on L UE and chest area.    Mobility  Bed Mobility Bed Mobility: Not assessed Transfers Transfers: Sit to Stand;Stand to Sit Sit to Stand: 4: Min guard;From chair/3-in-1;With upper extremity assist Stand to Sit: 4: Min guard;With upper extremity assist;To chair/3-in-1 Details for Transfer Assistance: Cues for R UE placement and safety Ambulation/Gait Ambulation/Gait Assistance: 4: Min guard Ambulation Distance (Feet): 150 Feet Assistive device: 1 person hand held assist (to mimic cane) Ambulation/Gait Assistance Details: (A) with balance and cues for proper technique Gait Pattern: Step-through pattern;Decreased stride length;Narrow base of support Gait velocity: decreased Stairs: No Wheelchair Mobility Wheelchair Mobility: No    Exercises General Exercises - Lower Extremity Ankle Circles/Pumps: AROM;Both;10 reps (to help decreased dizziness)   PT Diagnosis:    PT Problem List:   PT Treatment Interventions:     PT Goals Acute Rehab PT  Goals PT Goal Formulation: With patient Time For Goal Achievement: 07/05/12 Potential to Achieve Goals: Good Pt will go Sit to Stand: with modified independence PT Goal: Sit to Stand - Progress: Progressing toward goal Pt will go Stand to Sit: with modified independence PT Goal: Stand to Sit - Progress: Progressing toward goal Pt will Ambulate: >150 feet;with modified independence;with least restrictive assistive device PT Goal: Ambulate - Progress: Progressing toward goal  Visit Information  Last PT Received On: 07/01/12 Assistance Needed: +2    Subjective Data  Subjective: Pt states feeling sore and not being able to move L LE due to pain Patient Stated Goal: A job and a place to go.   Cognition  Overall Cognitive Status: Impaired Area of Impairment: Memory Arousal/Alertness: Awake/alert Orientation Level: Disoriented to;Time Behavior During Session: WFL for tasks performed Memory Deficits: After ambulation pt became confused    Balance     End of Session PT - End of Session Activity Tolerance: Patient tolerated treatment well;Patient limited by pain Patient left: in chair;with call bell/phone within reach Nurse Communication: Mobility status   GP     [name] 07/01/2012, 1:27 PM

## 2012-07-01 NOTE — Progress Notes (Signed)
Working on pain control.  No surgery planned for L clavicle per Dr. Magnus Ivan.  Patient told me this AM he is homeless and has been living in a shelter.  We will need to work with CSW on disposition. Patient examined and I agree with the assessment and plan  Violeta Gelinas, MD, MPH, FACS Pager: [phone]  07/01/2012 8:49 AM

## 2012-07-02 ENCOUNTER — Encounter (HOSPITAL_COMMUNITY): Payer: Self-pay

## 2012-07-02 ENCOUNTER — Inpatient Hospital Stay (HOSPITAL_COMMUNITY): Payer: Self-pay

## 2012-07-02 DIAGNOSIS — R1310 Dysphagia, unspecified: Secondary | ICD-10-CM

## 2012-07-02 MED ORDER — OXYCODONE-ACETAMINOPHEN 10-325 MG PO TABS: 1.0000 | ORAL_TABLET | Freq: Four times a day (QID) | ORAL | Status: DC | PRN

## 2012-07-02 MED ORDER — MORPHINE SULFATE ER 30 MG PO TBCR: 30.0000 mg | EXTENDED_RELEASE_TABLET | Freq: Three times a day (TID) | ORAL | Status: DC

## 2012-07-02 MED ORDER — TRAMADOL HCL 50 MG PO TABS: 100.0000 mg | ORAL_TABLET | Freq: Four times a day (QID) | ORAL | Status: DC

## 2012-07-02 MED ORDER — DOCUSATE SODIUM 100 MG PO CAPS
200.0000 mg | ORAL_CAPSULE | Freq: Two times a day (BID) | ORAL | Status: DC
  Administered 2012-07-02: 200 mg via ORAL
  Filled 2012-07-02: qty 1

## 2012-07-02 MED ORDER — BETHANECHOL CHLORIDE 10 MG PO TABS
10.0000 mg | ORAL_TABLET | Freq: Three times a day (TID) | ORAL | Status: DC
  Administered 2012-07-02 (×2): 10 mg via ORAL
  Filled 2012-07-02 (×3): qty 1

## 2012-07-02 MED ORDER — MAGNESIUM CITRATE PO SOLN
1.0000 | Freq: Once | ORAL | Status: AC
  Administered 2012-07-02: 1 via ORAL
  Filled 2012-07-02: qty 296

## 2012-07-02 NOTE — Progress Notes (Signed)
I agree with the following treatment note after reviewing documentation.   [name], [name]   OTR/L Pager: [phone] Office: [phone] .   
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
## 2012-07-02 NOTE — Progress Notes (Signed)
At 0850 patient rated pain at a 10 on a scale of 0-10. RN medicated patient with Oxycodone 20 mg PO. Pain was reassessed at 0950 and patient stated his pain was at an 8. I medicated patient with Dilaudid 0.5 mg IV at 1023. I also gave patient scheduled Ultram 100 mg at 1108. I reassessed patients pain at 1202, patient rated pain at a 10. Patient was not due for anymore pain medicine, I gave patient Xanax 0.25 mg PO at 1235. Patient seems relaxed and laying on the couch by the window. Dondra Spry, Student Nurse.

## 2012-07-02 NOTE — Progress Notes (Signed)
Pt and ex-wife given discharge instructions along with medication filled at the hospital pharmacy. The pt also received prescriptions to refill medication. The Pt will be returning back to the [facility] (homeless shelter). The pt will be transported by his ex-wife. Pt verbalized all discharge instructions and how to follow up with orthopedic services. Orson Ape D 07/02/2012
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
## 2012-07-02 NOTE — Clinical Social Work Note (Signed)
Clinical Social Worker facilitated patient discharge including contacting [employer] to confirm patient bed.  Patient remains on the list for bed availability tonight.  Patient is agreeable with return and appreciative for 3 day supply of medications.  PA provided patient with a script for permission to remain in the building during the day and to receive [facility] PT a few times a week.  RN to get patient wallet from security and go over discharge instructions with patient prior to discharge.  Patient ex-wife plans to transport patient to [facility] once ready.  Clinical Social Worker will sign off for now as social work intervention is no longer needed. Please consult [place] again if new need arises.  Macario Golds, [place] [phone]
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
## 2012-07-02 NOTE — Progress Notes (Addendum)
Physical Therapy Progress Note   07/02/12 1500  PT Visit Information  Last PT Received On 07/02/12  Assistance Needed +1  PT/OT Co-Evaluation/Treatment Yes  PT Time Calculation  PT Start Time 1352  PT Stop Time 1419  PT Time Calculation (min) 27 min  Subjective Data  Subjective My shoulder is in a lot of pain and I dont know if I can walk today  Patient Stated Goal To get better  Precautions  Precautions Fall  Required Braces or Orthoses Other Brace/Splint  Other Brace/Splint sling on lt arm  Restrictions  Weight Bearing Restrictions Yes  LUE Weight Bearing NWB  Cognition  Overall Cognitive Status Impaired  Area of Impairment Memory  Arousal/Alertness Awake/alert  Orientation Level Appears intact for tasks assessed  Behavior During Session [facility] for tasks performed  Memory Deficits Pt reports awareness of memory deficit  Bed Mobility  Bed Mobility Not assessed (PT not in room during bed mobility)  Transfers  Transfers Sit to Stand;Stand to Sit  Sit to Stand 5: Supervision;With upper extremity assist;From bed  Stand to Sit 5: Supervision;With upper extremity assist;To chair/3-in-1  Details for Transfer Assistance Cues for safety and proper technique  Ambulation/Gait  Ambulation/Gait Assistance 4: Min guard  Ambulation Distance (Feet) 150 Feet  Assistive device None  Ambulation/Gait Assistance Details Cues for posture and safety  Gait Pattern Step-through pattern;Decreased stride length  Gait velocity decreased  Stairs No  Wheelchair Mobility  Wheelchair Mobility No  PT - End of Session  Equipment Utilized During Treatment Gait belt  Activity Tolerance Patient tolerated treatment well;Patient limited by pain  Patient left in chair;with call bell/phone within reach;with family/visitor present  Nurse Communication Mobility status  PT - Assessment/Plan  Comments on Treatment Session Plan for session was to utilize a cane during ambulation.  Pt was able to ambulate without  an assistive device and reported not wanting to use the cane.  Pt did not experience any dizziness from ambulation and improved overall gait quality  PT Plan Discharge plan remains appropriate;Frequency remains appropriate  PT Frequency Min 4X/week  Follow Up Recommendations Home health PT  Equipment Recommended None recommended by PT  Acute Rehab PT Goals  PT Goal Formulation With patient  Time For Goal Achievement 07/05/12  Potential to Achieve Goals Good  Pt will go Sit to Stand with modified independence  PT Goal: Sit to Stand - Progress Progressing toward goal  Pt will go Stand to Sit with modified independence  PT Goal: Stand to Sit - Progress Progressing toward goal  Pt will Ambulate >150 feet;with modified independence;with least restrictive assistive device  PT Goal: Ambulate - Progress Partly met    8/10 pain in L UE.  Amy DiTommaso, SPT [place], [place] DPT [phone]
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
## 2012-07-02 NOTE — Progress Notes (Signed)
Still working on pain control.  ST to see regarding swallow issues. He asked me to call his sister and I did and got no answer. I left her a message. Patient examined and I agree with the assessment and plan  Violeta Gelinas, MD, MPH, FACS Pager: [phone]  07/02/2012 10:13 AM
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
## 2012-07-03 ENCOUNTER — Emergency Department (HOSPITAL_COMMUNITY): Payer: Self-pay

## 2012-07-03 ENCOUNTER — Inpatient Hospital Stay (HOSPITAL_COMMUNITY): Payer: Self-pay

## 2012-07-03 ENCOUNTER — Encounter (HOSPITAL_COMMUNITY): Payer: Self-pay | Admitting: Neurology

## 2012-07-03 ENCOUNTER — Inpatient Hospital Stay (HOSPITAL_COMMUNITY)
Admission: EM | Admit: 2012-07-03 | Discharge: 2012-07-12 | DRG: 982 | Disposition: A | Discharge status: Home / Self Care | Payer: No Typology Code available for payment source | Attending: Internal Medicine | Admitting: Internal Medicine

## 2012-07-03 DIAGNOSIS — R042 Hemoptysis: Secondary | ICD-10-CM

## 2012-07-03 DIAGNOSIS — B3789 Other sites of candidiasis: Secondary | ICD-10-CM | POA: Diagnosis present

## 2012-07-03 DIAGNOSIS — Y92410 Unspecified street and highway as the place of occurrence of the external cause: Secondary | ICD-10-CM

## 2012-07-03 DIAGNOSIS — S2249XA Multiple fractures of ribs, unspecified side, initial encounter for closed fracture: Secondary | ICD-10-CM

## 2012-07-03 DIAGNOSIS — F1411 Cocaine abuse, in remission: Secondary | ICD-10-CM | POA: Diagnosis present

## 2012-07-03 DIAGNOSIS — Z5900 Homelessness unspecified: Secondary | ICD-10-CM

## 2012-07-03 DIAGNOSIS — F1011 Alcohol abuse, in remission: Secondary | ICD-10-CM | POA: Diagnosis present

## 2012-07-03 DIAGNOSIS — E875 Hyperkalemia: Secondary | ICD-10-CM | POA: Diagnosis not present

## 2012-07-03 DIAGNOSIS — J189 Pneumonia, unspecified organism: Principal | ICD-10-CM

## 2012-07-03 DIAGNOSIS — S2242XA Multiple fractures of ribs, left side, initial encounter for closed fracture: Secondary | ICD-10-CM

## 2012-07-03 DIAGNOSIS — IMO0002 Reserved for concepts with insufficient information to code with codable children: Secondary | ICD-10-CM | POA: Diagnosis present

## 2012-07-03 DIAGNOSIS — K861 Other chronic pancreatitis: Secondary | ICD-10-CM | POA: Insufficient documentation

## 2012-07-03 DIAGNOSIS — E871 Hypo-osmolality and hyponatremia: Secondary | ICD-10-CM | POA: Diagnosis not present

## 2012-07-03 DIAGNOSIS — S42002A Fracture of unspecified part of left clavicle, initial encounter for closed fracture: Secondary | ICD-10-CM

## 2012-07-03 DIAGNOSIS — F17200 Nicotine dependence, unspecified, uncomplicated: Secondary | ICD-10-CM | POA: Diagnosis present

## 2012-07-03 DIAGNOSIS — S42102A Fracture of unspecified part of scapula, left shoulder, initial encounter for closed fracture: Secondary | ICD-10-CM

## 2012-07-03 DIAGNOSIS — J90 Pleural effusion, not elsewhere classified: Secondary | ICD-10-CM

## 2012-07-03 DIAGNOSIS — IMO0001 Reserved for inherently not codable concepts without codable children: Secondary | ICD-10-CM

## 2012-07-03 DIAGNOSIS — R1319 Other dysphagia: Secondary | ICD-10-CM | POA: Diagnosis present

## 2012-07-03 DIAGNOSIS — T07XXXA Unspecified multiple injuries, initial encounter: Secondary | ICD-10-CM | POA: Diagnosis present

## 2012-07-03 DIAGNOSIS — R1310 Dysphagia, unspecified: Secondary | ICD-10-CM

## 2012-07-03 DIAGNOSIS — S42109A Fracture of unspecified part of scapula, unspecified shoulder, initial encounter for closed fracture: Secondary | ICD-10-CM | POA: Diagnosis present

## 2012-07-03 DIAGNOSIS — R1114 Bilious vomiting: Secondary | ICD-10-CM | POA: Diagnosis not present

## 2012-07-03 DIAGNOSIS — S42023A Displaced fracture of shaft of unspecified clavicle, initial encounter for closed fracture: Secondary | ICD-10-CM | POA: Diagnosis present

## 2012-07-03 HISTORY — DX: Pneumonia, unspecified organism: J18.9

## 2012-07-03 HISTORY — DX: Gastro-esophageal reflux disease without esophagitis: K21.9

## 2012-07-03 HISTORY — DX: Personal history of other specified conditions: Z87.898

## 2012-07-03 HISTORY — DX: Low back pain: M54.5

## 2012-07-03 HISTORY — DX: Cocaine use, unspecified, in remission: F14.91

## 2012-07-03 HISTORY — DX: Other chronic pancreatitis: K86.1

## 2012-07-03 HISTORY — DX: Other chronic pain: G89.29

## 2012-07-03 HISTORY — DX: Unspecified osteoarthritis, unspecified site: M19.90

## 2012-07-03 HISTORY — DX: Multiple fractures of ribs, left side, initial encounter for closed fracture: S22.42XA

## 2012-07-03 HISTORY — DX: Alcohol abuse, in remission: F10.11

## 2012-07-03 HISTORY — DX: Fracture of unspecified part of left clavicle, initial encounter for closed fracture: S42.002A

## 2012-07-03 LAB — CBC WITH DIFFERENTIAL/PLATELET
Basophils Relative: 0 % (ref 0–1)
Hemoglobin: 12.5 g/dL — ABNORMAL LOW (ref 13.0–17.0)
MCHC: 33.5 g/dL (ref 30.0–36.0)
Monocytes Relative: 16 % — ABNORMAL HIGH (ref 3–12)
Neutro Abs: 5 10*3/uL (ref 1.7–7.7)
Neutrophils Relative %: 43 % (ref 43–77)
RBC: 4.12 MIL/uL — ABNORMAL LOW (ref 4.22–5.81)

## 2012-07-03 LAB — URINALYSIS, ROUTINE W REFLEX MICROSCOPIC
Bilirubin Urine: NEGATIVE
Glucose, UA: NEGATIVE mg/dL
Hgb urine dipstick: NEGATIVE
Ketones, ur: NEGATIVE mg/dL
Protein, ur: NEGATIVE mg/dL

## 2012-07-03 LAB — COMPREHENSIVE METABOLIC PANEL
ALT: 44 U/L (ref 0–53)
AST: 38 U/L — ABNORMAL HIGH (ref 0–37)
Albumin: 3.1 g/dL — ABNORMAL LOW (ref 3.5–5.2)
Alkaline Phosphatase: 73 U/L (ref 39–117)
BUN: 9 mg/dL (ref 6–23)
Chloride: 97 mEq/L (ref 96–112)
Potassium: 3.7 mEq/L (ref 3.5–5.1)
Total Bilirubin: 0.5 mg/dL (ref 0.3–1.2)

## 2012-07-03 LAB — RAPID URINE DRUG SCREEN, HOSP PERFORMED
Cocaine: NOT DETECTED
Opiates: POSITIVE — AB

## 2012-07-03 LAB — CK: Total CK: 107 U/L (ref 7–232)

## 2012-07-03 MED ORDER — ONDANSETRON HCL 4 MG/2ML IJ SOLN
4.0000 mg | Freq: Once | INTRAMUSCULAR | Status: AC
  Administered 2012-07-03: 4 mg via INTRAVENOUS
  Filled 2012-07-03: qty 2

## 2012-07-03 MED ORDER — OXYCODONE HCL 5 MG PO TABS
5.0000 mg | ORAL_TABLET | ORAL | Status: DC | PRN
  Administered 2012-07-03 – 2012-07-06 (×11): 5 mg via ORAL
  Filled 2012-07-03 (×11): qty 1

## 2012-07-03 MED ORDER — LEVOFLOXACIN IN D5W 750 MG/150ML IV SOLN
750.0000 mg | INTRAVENOUS | Status: AC
  Administered 2012-07-03 – 2012-07-05 (×3): 750 mg via INTRAVENOUS
  Filled 2012-07-03 (×3): qty 150

## 2012-07-03 MED ORDER — FENTANYL CITRATE 0.05 MG/ML IJ SOLN
100.0000 ug | Freq: Once | INTRAMUSCULAR | Status: AC
  Administered 2012-07-03: 100 ug via INTRAVENOUS
  Filled 2012-07-03: qty 2

## 2012-07-03 MED ORDER — VANCOMYCIN HCL IN DEXTROSE 1-5 GM/200ML-% IV SOLN
1000.0000 mg | Freq: Three times a day (TID) | INTRAVENOUS | Status: DC
  Administered 2012-07-03 – 2012-07-06 (×9): 1000 mg via INTRAVENOUS
  Filled 2012-07-03 (×10): qty 200

## 2012-07-03 MED ORDER — IOHEXOL 350 MG/ML SOLN
100.0000 mL | Freq: Once | INTRAVENOUS | Status: AC | PRN
  Administered 2012-07-03: 100 mL via INTRAVENOUS

## 2012-07-03 MED ORDER — KETOROLAC TROMETHAMINE 30 MG/ML IJ SOLN
30.0000 mg | Freq: Four times a day (QID) | INTRAMUSCULAR | Status: AC
  Administered 2012-07-03 – 2012-07-08 (×18): 30 mg via INTRAVENOUS
  Filled 2012-07-03 (×20): qty 1

## 2012-07-03 MED ORDER — ENOXAPARIN SODIUM 40 MG/0.4ML ~~LOC~~ SOLN
40.0000 mg | SUBCUTANEOUS | Status: DC
  Administered 2012-07-03 – 2012-07-11 (×9): 40 mg via SUBCUTANEOUS
  Filled 2012-07-03 (×10): qty 0.4

## 2012-07-03 MED ORDER — DEXTROSE 5 % IV SOLN: 1.0000 g | Freq: Once | INTRAVENOUS | Status: DC

## 2012-07-03 MED ORDER — DEXTROSE 5 % IV SOLN
1.0000 g | Freq: Three times a day (TID) | INTRAVENOUS | Status: DC
  Administered 2012-07-03 – 2012-07-10 (×22): 1 g via INTRAVENOUS
  Filled 2012-07-03 (×24): qty 1

## 2012-07-03 MED ORDER — SODIUM CHLORIDE 0.9 % IV SOLN
INTRAVENOUS | Status: DC
  Administered 2012-07-04 (×2): via INTRAVENOUS

## 2012-07-03 MED ORDER — SODIUM CHLORIDE 0.9 % IV SOLN
INTRAVENOUS | Status: AC
  Administered 2012-07-03: 21:00:00 via INTRAVENOUS

## 2012-07-03 MED ORDER — HYDROMORPHONE HCL PF 1 MG/ML IJ SOLN
1.0000 mg | Freq: Once | INTRAMUSCULAR | Status: DC
  Filled 2012-07-03: qty 1

## 2012-07-03 MED ORDER — MORPHINE SULFATE 2 MG/ML IJ SOLN
1.0000 mg | Freq: Once | INTRAMUSCULAR | Status: AC
  Administered 2012-07-03: 1 mg via INTRAVENOUS
  Filled 2012-07-03: qty 1

## 2012-07-03 MED ORDER — OXYCODONE-ACETAMINOPHEN 10-325 MG PO TABS: 1.0000 | ORAL_TABLET | ORAL | Status: DC | PRN

## 2012-07-03 MED ORDER — OXYCODONE-ACETAMINOPHEN 5-325 MG PO TABS
1.0000 | ORAL_TABLET | ORAL | Status: DC | PRN
  Administered 2012-07-03 – 2012-07-06 (×13): 1 via ORAL
  Filled 2012-07-03 (×13): qty 1

## 2012-07-03 NOTE — ED Notes (Signed)
Pt having left rib/clavicle pain. Pt reports cough. Skin warm and dry. Pt has multiple abrasion to body, in healing stage.
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
## 2012-07-03 NOTE — ED Notes (Signed)
RN on 4700 will call for report. 
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
## 2012-07-03 NOTE — Discharge Summary (Signed)
Garland Hincapie, MD, MPH, FACS Pager: [phone]  

## 2012-07-04 DIAGNOSIS — R042 Hemoptysis: Secondary | ICD-10-CM

## 2012-07-04 DIAGNOSIS — R1310 Dysphagia, unspecified: Secondary | ICD-10-CM

## 2012-07-04 DIAGNOSIS — Z590 Homelessness: Secondary | ICD-10-CM

## 2012-07-04 DIAGNOSIS — J189 Pneumonia, unspecified organism: Principal | ICD-10-CM

## 2012-07-04 LAB — HEMOGLOBIN A1C: Mean Plasma Glucose: 117 mg/dL — ABNORMAL HIGH (ref <117)

## 2012-07-04 LAB — EXPECTORATED SPUTUM ASSESSMENT W GRAM STAIN, RFLX TO RESP C

## 2012-07-04 LAB — CBC
Platelets: 371 10*3/uL (ref 150–400)
RBC: 3.74 MIL/uL — ABNORMAL LOW (ref 4.22–5.81)
WBC: 8.9 10*3/uL (ref 4.0–10.5)

## 2012-07-04 LAB — BASIC METABOLIC PANEL
CO2: 28 mEq/L (ref 19–32)
Calcium: 8.8 mg/dL (ref 8.4–10.5)
GFR calc non Af Amer: >90 mL/min (ref >90)
Potassium: 4.1 mEq/L (ref 3.5–5.1)
Sodium: 137 mEq/L (ref 135–145)

## 2012-07-04 LAB — LIPID PANEL
HDL: 20 mg/dL — ABNORMAL LOW (ref >39)
LDL Cholesterol: 85 mg/dL (ref 0–99)
Total CHOL/HDL Ratio: 6.6 RATIO

## 2012-07-04 LAB — TSH: TSH: 0.017 u[IU]/mL — ABNORMAL LOW (ref 0.350–4.500)
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
## 2012-07-04 NOTE — Progress Notes (Signed)
Clinical Social Work-CSW received referral for Homelessness-notes pt has been living at [facility] left message for [employer] and will follow with [place] [facility], [phone]
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
## 2012-07-04 NOTE — Progress Notes (Signed)
Co-signed for Meredith Leonard RN/BSN for assessments, IV assessments, medication administrations, I & O's, Vital signs, care plans, patient education, and progress notes. Andris Brothers M, RN/BSN 

## 2012-07-05 ENCOUNTER — Inpatient Hospital Stay (HOSPITAL_COMMUNITY): Payer: Self-pay

## 2012-07-05 DIAGNOSIS — J90 Pleural effusion, not elsewhere classified: Secondary | ICD-10-CM | POA: Diagnosis present

## 2012-07-05 LAB — CBC
Hemoglobin: 11.6 g/dL — ABNORMAL LOW (ref 13.0–17.0)
RBC: 3.73 MIL/uL — ABNORMAL LOW (ref 4.22–5.81)
WBC: 10.5 10*3/uL (ref 4.0–10.5)

## 2012-07-05 LAB — BASIC METABOLIC PANEL
GFR calc Af Amer: >90 mL/min (ref >90)
GFR calc non Af Amer: >90 mL/min (ref >90)
Potassium: 4 mEq/L (ref 3.5–5.1)
Sodium: 136 mEq/L (ref 135–145)

## 2012-07-05 LAB — BODY FLUID CELL COUNT WITH DIFFERENTIAL
Lymphs, Fluid: 29 %
Monocyte-Macrophage-Serous Fluid: 64 % (ref 50–90)
Other Cells, Fluid: 0 %

## 2012-07-05 LAB — TRIGLYCERIDES, BODY FLUIDS: Triglycerides, Fluid: 23 mg/dL

## 2012-07-05 LAB — T3: T3, Total: 127.4 ng/dL (ref 80.0–204.0)

## 2012-07-05 LAB — T4, FREE: Free T4: 0.9 ng/dL (ref 0.80–1.80)

## 2012-07-05 LAB — ALBUMIN, FLUID (OTHER)

## 2012-07-05 LAB — AMYLASE, BODY FLUID

## 2012-07-05 LAB — GLUCOSE, SEROUS FLUID: Glucose, Fluid: 138 mg/dL

## 2012-07-05 LAB — LACTATE DEHYDROGENASE, PLEURAL OR PERITONEAL FLUID: LD, Fluid: 211 U/L — ABNORMAL HIGH (ref 3–23)

## 2012-07-05 MED ORDER — MORPHINE SULFATE ER 15 MG PO TBCR
15.0000 mg | EXTENDED_RELEASE_TABLET | Freq: Two times a day (BID) | ORAL | Status: DC
  Administered 2012-07-05 – 2012-07-09 (×9): 15 mg via ORAL
  Filled 2012-07-05 (×9): qty 1

## 2012-07-05 MED ORDER — DOCUSATE SODIUM 100 MG PO CAPS
100.0000 mg | ORAL_CAPSULE | Freq: Two times a day (BID) | ORAL | Status: DC
  Administered 2012-07-05 – 2012-07-12 (×7): 100 mg via ORAL
  Filled 2012-07-05 (×16): qty 1

## 2012-07-05 MED ORDER — IOHEXOL 300 MG/ML  SOLN
75.0000 mL | Freq: Once | INTRAMUSCULAR | Status: AC | PRN
  Administered 2012-07-05: 75 mL via INTRAVENOUS

## 2012-07-05 MED ORDER — DIPHENHYDRAMINE HCL 25 MG PO CAPS
25.0000 mg | ORAL_CAPSULE | Freq: Every evening | ORAL | Status: DC | PRN
  Administered 2012-07-06 – 2012-07-07 (×2): 25 mg via ORAL
  Filled 2012-07-05 (×3): qty 1
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
## 2012-07-05 NOTE — Progress Notes (Signed)
Cosign for [employer] RN's assessment, med administration, note(s), I/O, care plan/ education.  Lorretta Harp RN
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
## 2012-07-05 NOTE — Progress Notes (Signed)
Patient has returned to the unit and is stable; will continue to monitor patient. D .Helen Cuff RN 

## 2012-07-05 NOTE — Progress Notes (Signed)
Ultrasound called to request pain medication to be brought down to give to the patient, he was complaining of severe pain, medication has been taken down to ultrasound and given to patient.  Lorretta Harp RN

## 2012-07-05 NOTE — Procedures (Signed)
Procedure : left thoracentesis Specimen : 230 ml blood tinged serous fluid Complications : none immediate  Fluid sent to lab as requested.  Pt tolerated well. Post CXR pending.  Full report in canopy
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
## 2012-07-05 NOTE — Progress Notes (Signed)
Internal Medicine Teaching Service Attending Dr.Bolton Canupp I have examined the patient at bedside today and discussed the management of the Patient with the resident team. [name] needs better pain control in the form of ordering long acting opioids. He needs an ortho consult for worsening left shoulder pain for which and x ray would be performed. He also shows some fluid in his lungs and elevated pro BNP. If he desaturates diuresis would be appropriate. IR consult for tapping his left Pleural effusion. Rest per resident documentation.

## 2012-07-06 LAB — VANCOMYCIN, TROUGH: Vancomycin Tr: 21.3 ug/mL — ABNORMAL HIGH (ref 10.0–20.0)

## 2012-07-06 LAB — CBC
Platelets: 495 10*3/uL — ABNORMAL HIGH (ref 150–400)
RBC: 3.94 MIL/uL — ABNORMAL LOW (ref 4.22–5.81)
WBC: 12.4 10*3/uL — ABNORMAL HIGH (ref 4.0–10.5)

## 2012-07-06 LAB — BASIC METABOLIC PANEL
CO2: 25 mEq/L (ref 19–32)
Chloride: 105 mEq/L (ref 96–112)
Sodium: 139 mEq/L (ref 135–145)

## 2012-07-06 LAB — LACTATE DEHYDROGENASE: LDH: 267 U/L — ABNORMAL HIGH (ref 94–250)

## 2012-07-06 LAB — CULTURE, RESPIRATORY W GRAM STAIN

## 2012-07-06 MED ORDER — OXYCODONE-ACETAMINOPHEN 5-325 MG PO TABS: 1.0000 | ORAL_TABLET | ORAL | Status: DC | PRN

## 2012-07-06 MED ORDER — MORPHINE SULFATE 2 MG/ML IJ SOLN
1.0000 mg | INTRAMUSCULAR | Status: DC | PRN
  Administered 2012-07-06 – 2012-07-08 (×9): 1 mg via INTRAVENOUS
  Filled 2012-07-06 (×9): qty 1

## 2012-07-06 MED ORDER — OXYCODONE HCL 5 MG PO TABS
10.0000 mg | ORAL_TABLET | ORAL | Status: DC | PRN
  Administered 2012-07-06 – 2012-07-08 (×8): 10 mg via ORAL
  Administered 2012-07-09: 1 mg via ORAL
  Administered 2012-07-09 (×3): 10 mg via ORAL
  Filled 2012-07-06 (×4): qty 2
  Filled 2012-07-06: qty 1
  Filled 2012-07-06 (×4): qty 2
  Filled 2012-07-06: qty 1
  Filled 2012-07-06: qty 2
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
## 2012-07-06 NOTE — Progress Notes (Signed)
Internal Medicine Teaching Service Attending Dr.Sally-Ann Cutbirth I have examined the patient at bedside today and discussed the management of the Patient with the resident team. Unfortunately a serum LDH serum Protein were not ordered at the time of pleural fluid tap to ascertain the characteristic of fluid.  Ph is also not resulted. WE should obtain all these. Diuresis would be appropriate in this patient who has received lot of fluids during his last admission. Further work up for dysphagia warrented.

## 2012-07-07 LAB — CBC
Hemoglobin: 12.3 g/dL — ABNORMAL LOW (ref 13.0–17.0)
MCHC: 33.8 g/dL (ref 30.0–36.0)
Platelets: 517 10*3/uL — ABNORMAL HIGH (ref 150–400)
RBC: 4.02 MIL/uL — ABNORMAL LOW (ref 4.22–5.81)

## 2012-07-07 LAB — BASIC METABOLIC PANEL
CO2: 23 mEq/L (ref 19–32)
GFR calc non Af Amer: >90 mL/min (ref >90)
Glucose, Bld: 101 mg/dL — ABNORMAL HIGH (ref 70–99)
Potassium: 4.4 mEq/L (ref 3.5–5.1)
Sodium: 134 mEq/L — ABNORMAL LOW (ref 135–145)

## 2012-07-07 MED ORDER — LEVOFLOXACIN 750 MG PO TABS
750.0000 mg | ORAL_TABLET | Freq: Every day | ORAL | Status: DC
  Administered 2012-07-07 – 2012-07-08 (×2): 750 mg via ORAL
  Filled 2012-07-07 (×4): qty 1
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
## 2012-07-08 ENCOUNTER — Inpatient Hospital Stay (HOSPITAL_COMMUNITY): Payer: Self-pay

## 2012-07-08 LAB — COMPREHENSIVE METABOLIC PANEL
ALT: 53 U/L (ref 0–53)
AST: 48 U/L — ABNORMAL HIGH (ref 0–37)
Alkaline Phosphatase: 92 U/L (ref 39–117)
CO2: 22 mEq/L (ref 19–32)
Chloride: 103 mEq/L (ref 96–112)
GFR calc Af Amer: >90 mL/min (ref >90)
GFR calc non Af Amer: >90 mL/min (ref >90)
Glucose, Bld: 89 mg/dL (ref 70–99)
Sodium: 135 mEq/L (ref 135–145)
Total Bilirubin: 0.2 mg/dL — ABNORMAL LOW (ref 0.3–1.2)

## 2012-07-08 LAB — CBC
Hemoglobin: 12.3 g/dL — ABNORMAL LOW (ref 13.0–17.0)
Platelets: 583 10*3/uL — ABNORMAL HIGH (ref 150–400)
RBC: 4.13 MIL/uL — ABNORMAL LOW (ref 4.22–5.81)
WBC: 11.8 10*3/uL — ABNORMAL HIGH (ref 4.0–10.5)

## 2012-07-08 LAB — BODY FLUID CULTURE: Culture: NO GROWTH

## 2012-07-08 MED ORDER — HYDROMORPHONE HCL PF 1 MG/ML IJ SOLN
1.0000 mg | INTRAMUSCULAR | Status: DC | PRN
Start: 2012-07-08 — End: 2012-07-09
  Administered 2012-07-08 – 2012-07-09 (×4): 1 mg via INTRAVENOUS
  Filled 2012-07-08 (×4): qty 1
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
## 2012-07-08 NOTE — Progress Notes (Signed)
Speech Language Pathology Dysphagia Treatment Patient Details Name: [name] MRN: [id number] DOB: [birth year] Today's Date: 07/08/2012 Time: 4098-1191 SLP Time Calculation (min): 8 min  Assessment / Plan / Recommendation Clinical Impression  Pt. seen today for continued safety with po's and possibility of upgrading to regular texture.  Pt.  underwent esophagram this am revealing no evidence for aspiration and 12.5 mm barium tablet lodged at the distal esophagus but ultimately passed into the stomach after repeated swallows of water and thin barium.  He reports no significant difficulty over the weekend with po's and states "I can tell when it wants to go the wrong way and can control it pretty good."  He reports only  having to tuck his chin sometimes while taking his pills (recommended chin tuck only if pt. feels weaker or he needs to tuck chin per last session).  He required minimal verbal reminders for small sips.  SLP also reviewed reflux strategies.  Recommend pt. upgrade to regualr texture and pt. is eager to try.  No continued ST is recommended.      Diet Recommendation  Initiate / Change Diet: Regular;Thin liquid    SLP Plan Discharge SLP treatment due to (comment);All goals met   Pertinent Vitals/Pain    Swallowing Goals  SLP Swallowing Goals Patient will consume recommended diet without observed clinical signs of aspiration with: Modified independent assistance Swallow Study Goal #1 - Progress: Met Patient will utilize recommended strategies during swallow to increase swallowing safety with: Modified independent assistance Swallow Study Goal #2 - Progress: Met  General Temperature Spikes Noted: No Respiratory Status: Room air Behavior/Cognition: Cooperative;Alert;Pleasant mood Oral Cavity - Dentition: Adequate natural dentition;Missing dentition Patient Positioning: Upright in bed  Oral Cavity - Oral Hygiene Does patient have any of the following "at risk" factors?:  None of the above Brush patient's teeth BID with toothbrush (using toothpaste with fluoride): Yes   Dysphagia Treatment Treatment focused on: Skilled observation of diet tolerance Treatment Methods/Modalities: Skilled observation Patient observed directly with PO's: Yes Type of PO's observed: Thin liquids Feeding: Able to feed self Liquids provided via: Cup Type of cueing: Verbal Amount of cueing: Minimal        Royce Macadamia M.Ed [employer] [phone]  07/08/2012

## 2012-07-09 ENCOUNTER — Inpatient Hospital Stay (HOSPITAL_COMMUNITY): Payer: MEDICAID | Admitting: Anesthesiology

## 2012-07-09 ENCOUNTER — Inpatient Hospital Stay (HOSPITAL_COMMUNITY): Payer: MEDICAID

## 2012-07-09 ENCOUNTER — Encounter (HOSPITAL_COMMUNITY): Payer: Self-pay | Admitting: Anesthesiology

## 2012-07-09 ENCOUNTER — Encounter (HOSPITAL_COMMUNITY)
Admission: EM | Disposition: A | Discharge status: Home / Self Care | Payer: Self-pay | Source: Home / Self Care | Attending: Internal Medicine

## 2012-07-09 HISTORY — PX: ORIF CLAVICULAR FRACTURE: SHX5055

## 2012-07-09 LAB — BASIC METABOLIC PANEL
CO2: 21 mEq/L (ref 19–32)
Calcium: 9.8 mg/dL (ref 8.4–10.5)
Creatinine, Ser: 0.79 mg/dL (ref 0.50–1.35)
GFR calc Af Amer: >90 mL/min (ref >90)
GFR calc non Af Amer: >90 mL/min (ref >90)

## 2012-07-09 LAB — CBC
MCH: 30.2 pg (ref 26.0–34.0)
MCHC: 33.2 g/dL (ref 30.0–36.0)
MCV: 91 fL (ref 78.0–100.0)
Platelets: 502 10*3/uL — ABNORMAL HIGH (ref 150–400)
RDW: 13.8 % (ref 11.5–15.5)

## 2012-07-09 LAB — SURGICAL PCR SCREEN: MRSA, PCR: NEGATIVE

## 2012-07-09 SURGERY — OPEN REDUCTION INTERNAL FIXATION (ORIF) CLAVICULAR FRACTURE
Anesthesia: General | Laterality: Left

## 2012-07-09 SURGERY — OPEN REDUCTION INTERNAL FIXATION (ORIF) CLAVICULAR FRACTURE
Anesthesia: General | Laterality: Left | Wound class: Clean

## 2012-07-09 MED ORDER — ROCURONIUM BROMIDE 100 MG/10ML IV SOLN
INTRAVENOUS | Status: DC | PRN
  Administered 2012-07-09: 50 mg via INTRAVENOUS

## 2012-07-09 MED ORDER — LACTATED RINGERS IV SOLN
INTRAVENOUS | Status: DC | PRN
  Administered 2012-07-09: 11:00:00 via INTRAVENOUS

## 2012-07-09 MED ORDER — HYDROMORPHONE BOLUS VIA INFUSION: 1.0000 mg | INTRAVENOUS | Status: DC | PRN

## 2012-07-09 MED ORDER — OXYCODONE HCL 5 MG PO TABS
ORAL_TABLET | ORAL | Status: AC
  Administered 2012-07-09: 1 mg via ORAL
  Filled 2012-07-09: qty 2

## 2012-07-09 MED ORDER — HYDROMORPHONE HCL PF 1 MG/ML IJ SOLN
0.2500 mg | INTRAMUSCULAR | Status: DC | PRN
  Administered 2012-07-09: 0.5 mg via INTRAVENOUS

## 2012-07-09 MED ORDER — DIPHENHYDRAMINE HCL 50 MG/ML IJ SOLN: 12.5000 mg | Freq: Four times a day (QID) | INTRAMUSCULAR | Status: DC | PRN

## 2012-07-09 MED ORDER — ONDANSETRON HCL 4 MG/2ML IJ SOLN
INTRAMUSCULAR | Status: DC | PRN
  Administered 2012-07-09: 4 mg via INTRAVENOUS

## 2012-07-09 MED ORDER — BUPIVACAINE-EPINEPHRINE 0.5% -1:200000 IJ SOLN
INTRAMUSCULAR | Status: DC | PRN
  Administered 2012-07-09: 10 mL

## 2012-07-09 MED ORDER — ONDANSETRON HCL 4 MG/2ML IJ SOLN
4.0000 mg | Freq: Three times a day (TID) | INTRAMUSCULAR | Status: DC | PRN
  Administered 2012-07-09 – 2012-07-12 (×3): 4 mg via INTRAVENOUS
  Filled 2012-07-09 (×2): qty 2

## 2012-07-09 MED ORDER — HYDROMORPHONE HCL PF 1 MG/ML IJ SOLN
1.0000 mg | INTRAMUSCULAR | Status: DC | PRN
  Administered 2012-07-09: 1 mg via INTRAVENOUS
  Filled 2012-07-09: qty 1

## 2012-07-09 MED ORDER — FENTANYL CITRATE 0.05 MG/ML IJ SOLN
INTRAMUSCULAR | Status: DC | PRN
  Administered 2012-07-09: 250 ug via INTRAVENOUS
  Administered 2012-07-09: 100 ug via INTRAVENOUS

## 2012-07-09 MED ORDER — HYDROMORPHONE HCL PF 1 MG/ML IJ SOLN
1.0000 mg | Freq: Once | INTRAMUSCULAR | Status: AC
  Administered 2012-07-09: 1 mg via INTRAVENOUS
  Filled 2012-07-09: qty 1

## 2012-07-09 MED ORDER — GLYCOPYRROLATE 0.2 MG/ML IJ SOLN
INTRAMUSCULAR | Status: DC | PRN
  Administered 2012-07-09: .4 mg via INTRAVENOUS

## 2012-07-09 MED ORDER — HYDROMORPHONE HCL PF 1 MG/ML IJ SOLN
INTRAMUSCULAR | Status: AC
  Filled 2012-07-09: qty 1

## 2012-07-09 MED ORDER — BUPIVACAINE-EPINEPHRINE (PF) 0.5% -1:200000 IJ SOLN
INTRAMUSCULAR | Status: AC
  Filled 2012-07-09: qty 10

## 2012-07-09 MED ORDER — MIDAZOLAM HCL 5 MG/5ML IJ SOLN
INTRAMUSCULAR | Status: DC | PRN
  Administered 2012-07-09: 2 mg via INTRAVENOUS

## 2012-07-09 MED ORDER — SODIUM CHLORIDE 0.9 % IJ SOLN: 9.0000 mL | INTRAMUSCULAR | Status: DC | PRN

## 2012-07-09 MED ORDER — PROPOFOL 10 MG/ML IV BOLUS
INTRAVENOUS | Status: DC | PRN
  Administered 2012-07-09: 200 mg via INTRAVENOUS

## 2012-07-09 MED ORDER — DIPHENHYDRAMINE HCL 12.5 MG/5ML PO ELIX
12.5000 mg | ORAL_SOLUTION | Freq: Four times a day (QID) | ORAL | Status: DC | PRN
  Filled 2012-07-09: qty 5

## 2012-07-09 MED ORDER — FENTANYL CITRATE 0.05 MG/ML IJ SOLN
100.0000 ug | Freq: Once | INTRAMUSCULAR | Status: AC
Start: 2012-07-09 — End: 2012-07-09
  Administered 2012-07-09: 100 ug via INTRAVENOUS

## 2012-07-09 MED ORDER — FENTANYL CITRATE 0.05 MG/ML IJ SOLN
INTRAMUSCULAR | Status: AC
  Filled 2012-07-09: qty 2

## 2012-07-09 MED ORDER — ONDANSETRON HCL 4 MG/2ML IJ SOLN
4.0000 mg | Freq: Four times a day (QID) | INTRAMUSCULAR | Status: DC | PRN
  Filled 2012-07-09: qty 2

## 2012-07-09 MED ORDER — VECURONIUM BROMIDE 10 MG IV SOLR
INTRAVENOUS | Status: DC | PRN
  Administered 2012-07-09: 2 mg via INTRAVENOUS

## 2012-07-09 MED ORDER — LACTATED RINGERS IV SOLN
INTRAVENOUS | Status: DC
  Administered 2012-07-09: 10:00:00 via INTRAVENOUS
  Administered 2012-07-09: 50 mL/h via INTRAVENOUS

## 2012-07-09 MED ORDER — NEOSTIGMINE METHYLSULFATE 1 MG/ML IJ SOLN
INTRAMUSCULAR | Status: DC | PRN
  Administered 2012-07-09: 2.5 mg via INTRAVENOUS

## 2012-07-09 MED ORDER — NALOXONE HCL 0.4 MG/ML IJ SOLN: 0.4000 mg | INTRAMUSCULAR | Status: DC | PRN

## 2012-07-09 MED ORDER — ONDANSETRON HCL 4 MG/2ML IJ SOLN: 4.0000 mg | Freq: Once | INTRAMUSCULAR | Status: DC | PRN

## 2012-07-09 MED ORDER — LIDOCAINE HCL (CARDIAC) 20 MG/ML IV SOLN
INTRAVENOUS | Status: DC | PRN
  Administered 2012-07-09: 100 mg via INTRAVENOUS

## 2012-07-09 MED ORDER — LIDOCAINE HCL 4 % MT SOLN
OROMUCOSAL | Status: DC | PRN
  Administered 2012-07-09: 4 mL via TOPICAL

## 2012-07-09 MED ORDER — EPHEDRINE SULFATE 50 MG/ML IJ SOLN
INTRAMUSCULAR | Status: DC | PRN
  Administered 2012-07-09 (×5): 10 mg via INTRAVENOUS

## 2012-07-09 MED ORDER — HYDROMORPHONE 0.3 MG/ML IV SOLN
INTRAVENOUS | Status: DC
  Administered 2012-07-09: 21:00:00 via INTRAVENOUS
  Administered 2012-07-10: 1.8 mg via INTRAVENOUS
  Administered 2012-07-10: 4.1 mg via INTRAVENOUS
  Filled 2012-07-09: qty 25

## 2012-07-09 SURGICAL SUPPLY — 67 items
8 hole left clavicle plate (Orthopedic Implant) ×1 IMPLANT
APL SKNCLS STERI-STRIP NONHPOA (GAUZE/BANDAGES/DRESSINGS) ×1
BENZOIN TINCTURE PRP APPL 2/3 (GAUZE/BANDAGES/DRESSINGS) ×1 IMPLANT
BIT DRILL 2.8X5 QR DISP (BIT) ×1 IMPLANT
BIT DRILL QUICK RELEASE 2.0MM (INSTRUMENTS) IMPLANT
BRUSH SCRUB DISP (MISCELLANEOUS) ×4 IMPLANT
CLOTH BEACON ORANGE TIMEOUT ST (SAFETY) ×2 IMPLANT
CLSR STERI-STRIP ANTIMIC 1/2X4 (GAUZE/BANDAGES/DRESSINGS) ×1 IMPLANT
COVER SURGICAL LIGHT HANDLE (MISCELLANEOUS) ×4 IMPLANT
DRAPE C-ARM 42X72 X-RAY (DRAPES) ×2 IMPLANT
DRAPE C-ARMOR (DRAPES) ×2 IMPLANT
DRAPE INCISE IOBAN 66X45 STRL (DRAPES) ×2 IMPLANT
DRAPE ORTHO SPLIT 77X108 STRL (DRAPES) ×2
DRAPE SURG ORHT 6 SPLT 77X108 (DRAPES) ×1 IMPLANT
DRAPE U-SHAPE 47X51 STRL (DRAPES) ×4 IMPLANT
DRILL QUICK RELEASE 2.0MM (INSTRUMENTS) ×2
DRSG EMULSION OIL 3X3 NADH (GAUZE/BANDAGES/DRESSINGS) ×2 IMPLANT
DRSG MEPILEX BORDER 4X8 (GAUZE/BANDAGES/DRESSINGS) ×1 IMPLANT
ELECT NDL TIP 2.8 STRL (NEEDLE) ×1 IMPLANT
ELECT NEEDLE TIP 2.8 STRL (NEEDLE) ×2 IMPLANT
ELECT REM PT RETURN 9FT ADLT (ELECTROSURGICAL) ×2
ELECTRODE REM PT RTRN 9FT ADLT (ELECTROSURGICAL) ×1 IMPLANT
GLOVE BIO SURGEON STRL SZ7.5 (GLOVE) ×2 IMPLANT
GLOVE BIO SURGEON STRL SZ8 (GLOVE) ×2 IMPLANT
GLOVE BIOGEL PI IND STRL 6.5 (GLOVE) IMPLANT
GLOVE BIOGEL PI IND STRL 7.0 (GLOVE) IMPLANT
GLOVE BIOGEL PI IND STRL 7.5 (GLOVE) ×1 IMPLANT
GLOVE BIOGEL PI IND STRL 8 (GLOVE) ×1 IMPLANT
GLOVE BIOGEL PI INDICATOR 6.5 (GLOVE) ×1
GLOVE BIOGEL PI INDICATOR 7.0 (GLOVE) ×1
GLOVE BIOGEL PI INDICATOR 7.5 (GLOVE) ×1
GLOVE BIOGEL PI INDICATOR 8 (GLOVE) ×1
GOWN PREVENTION PLUS XLARGE (GOWN DISPOSABLE) ×2 IMPLANT
GOWN STRL NON-REIN LRG LVL3 (GOWN DISPOSABLE) ×4 IMPLANT
KIT BASIN OR (CUSTOM PROCEDURE TRAY) ×2 IMPLANT
KIT ROOM TURNOVER OR (KITS) ×2 IMPLANT
MANIFOLD NEPTUNE II (INSTRUMENTS) ×2 IMPLANT
NDL HYPO 25GX1X1/2 BEV (NEEDLE) ×1 IMPLANT
NEEDLE HYPO 25GX1X1/2 BEV (NEEDLE) ×2 IMPLANT
NS IRRIG 1000ML POUR BTL (IV SOLUTION) ×2 IMPLANT
PACK TOTAL JOINT (CUSTOM PROCEDURE TRAY) ×2 IMPLANT
PAD ARMBOARD 7.5X6 YLW CONV (MISCELLANEOUS) ×4 IMPLANT
SCREW 2.7MMX14.0MM (Screw) ×1 IMPLANT
SCREW 3.5MMX12.0MM (Screw) ×1 IMPLANT
SCREW 3.5MMX16.0MM (Screw) ×1 IMPLANT
SCREW 3.5MMX18.0MM (Screw) ×2 IMPLANT
SCREW BONE 2.7X16MM (Screw) ×1 IMPLANT
SCREW CORT 3.5X14 (Screw) ×1 IMPLANT
SCREW CORTICAL 3.5X24 (Screw) ×1 IMPLANT
SPONGE GAUZE 4X4 12PLY (GAUZE/BANDAGES/DRESSINGS) ×2 IMPLANT
SPONGE LAP 18X18 X RAY DECT (DISPOSABLE) ×4 IMPLANT
STAPLER SKIN PROX WIDE 3.9 (STAPLE) ×1 IMPLANT
STRIP CLOSURE SKIN 1/2X4 (GAUZE/BANDAGES/DRESSINGS) ×2 IMPLANT
SUCTION FRAZIER TIP 10 FR DISP (SUCTIONS) ×2 IMPLANT
SUT ETHILON 3 0 PS 1 (SUTURE) ×2 IMPLANT
SUT MNCRL AB 4-0 PS2 18 (SUTURE) ×2 IMPLANT
SUT PDS AB 0 CT 36 (SUTURE) ×1 IMPLANT
SUT PDS AB 2-0 CT1 27 (SUTURE) ×1 IMPLANT
SUT PROLENE 3 0 PS 1 (SUTURE) ×2 IMPLANT
SUT VIC AB 0 CT1 27 (SUTURE) ×2
SUT VIC AB 0 CT1 27XBRD ANBCTR (SUTURE) ×1 IMPLANT
SUT VIC AB 2-0 CT1 27 (SUTURE) ×2
SUT VIC AB 2-0 CT1 TAPERPNT 27 (SUTURE) ×1 IMPLANT
SUT VIC AB 2-0 CT3 27 (SUTURE) IMPLANT
SYR CONTROL 10ML LL (SYRINGE) ×2 IMPLANT
WATER STERILE IRR 1000ML POUR (IV SOLUTION) ×2 IMPLANT
YANKAUER SUCT BULB TIP NO VENT (SUCTIONS) ×2 IMPLANT

## 2012-07-09 NOTE — Anesthesia Postprocedure Evaluation (Signed)
  Anesthesia Post-op Note  Patient: [name]  Procedure(s) Performed: Procedure(s) (LRB) with comments: OPEN REDUCTION INTERNAL FIXATION (ORIF) CLAVICULAR FRACTURE (Left)  Patient Location: PACU  Anesthesia Type: General  Level of Consciousness: awake, alert  and oriented  Airway and Oxygen Therapy: Patient Spontanous Breathing and Patient connected to nasal cannula oxygen  Post-op Pain: mild  Post-op Assessment: Post-op Vital signs reviewed and Patient's Cardiovascular Status Stable  Post-op Vital Signs: stable  Complications: No apparent anesthesia complications

## 2012-07-09 NOTE — Transfer of Care (Signed)
Immediate Anesthesia Transfer of Care Note  Patient: [name]  Procedure(s) Performed: Procedure(s) (LRB) with comments: OPEN REDUCTION INTERNAL FIXATION (ORIF) CLAVICULAR FRACTURE (Left)  Patient Location: PACU  Anesthesia Type: General  Level of Consciousness: awake, alert  and oriented  Airway & Oxygen Therapy: Patient Spontanous Breathing and Patient connected to nasal cannula oxygen  Post-op Assessment: Report given to PACU RN and Post -op Vital signs reviewed and stable  Post vital signs: Reviewed and stable  Complications: No apparent anesthesia complications

## 2012-07-09 NOTE — Brief Op Note (Signed)
07/03/2012 - 07/09/2012  12:40 PM  PATIENT:  [name]  56 y.o. male  PRE-OPERATIVE DIAGNOSIS:  left clavicle fracture  POST-OPERATIVE DIAGNOSIS:  left clavicle fracture  PROCEDURE:  Procedure(s) (LRB) with comments: OPEN REDUCTION INTERNAL FIXATION (ORIF) CLAVICULAR FRACTURE (Left)  SURGEON:  Surgeon(s) and Role:    * Budd Palmer, MD - Primary  PHYSICIAN ASSISTANT: Montez Morita, [facility]  ANESTHESIA:   general  EBL:  Total I/O In: 1000 [I.V.:1000] Out: -   BLOOD ADMINISTERED:none  DRAINS: none   LOCAL MEDICATIONS USED:  MARCAINE    and NONE  SPECIMEN:  No Specimen  DISPOSITION OF SPECIMEN:  N/A  COUNTS:  YES  TOURNIQUET:  * No tourniquets in log *  DICTATION: .Other Dictation: Dictation Number [id number]  PLAN OF CARE: Admit to inpatient   PATIENT DISPOSITION:  PACU - hemodynamically stable.   Delay start of Pharmacological VTE agent (>24hrs) due to surgical blood loss or risk of bleeding: no

## 2012-07-09 NOTE — Preoperative (Signed)
Beta Blockers   Reason not to administer Beta Blockers:Not Applicable 

## 2012-07-09 NOTE — Anesthesia Procedure Notes (Signed)
Procedure Name: Intubation Date/Time: 07/09/2012 10:46 AM Performed by: Marena Chancy Pre-anesthesia Checklist: Patient identified, Timeout performed, Emergency Drugs available, Suction available and Patient being monitored Patient Re-evaluated:Patient Re-evaluated prior to inductionOxygen Delivery Method: Circle system utilized Preoxygenation: Pre-oxygenation with 100% oxygen Intubation Type: IV induction Ventilation: Mask ventilation without difficulty and Oral airway inserted - appropriate to patient size Laryngoscope Size: Hyacinth Meeker and 2 Grade View: Grade I Tube type: Oral Tube size: 7.5 mm Number of attempts: 1 Placement Confirmation: ETT inserted through vocal cords under direct vision,  breath sounds checked- equal and bilateral and positive ETCO2 Secured at: 23 cm Tube secured with: Tape Dental Injury: Teeth and Oropharynx as per pre-operative assessment
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
## 2012-07-09 NOTE — Progress Notes (Signed)
Was told by OR that Pt antibs that I sent down were give when in OR so I check off the 2 antibs in the [facility]. Found the levifloxcain tape to the chart. There Pt did not received. Edit the [facility] was not giving .

## 2012-07-09 NOTE — Progress Notes (Signed)
Clinical Social Work-CSW received referral for possible placement post surgery as pt currently homeless-CSW will contact financial counselor with regards to medicaid application and will initiate FL2 in order to facilitate bed search when appropriate- [employer]

## 2012-07-09 NOTE — Progress Notes (Signed)
Page MD. Pt states 1 Mg  Dilaudid did not help his pain.  Will go to 1q on dilaudid. Per MD
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
## 2012-07-09 NOTE — Progress Notes (Signed)
Active Range of motion and Passive Range of Motion dffr to OT . Per Oletha Blend PA

## 2012-07-10 ENCOUNTER — Encounter (HOSPITAL_COMMUNITY): Payer: Self-pay | Admitting: Orthopedic Surgery

## 2012-07-10 ENCOUNTER — Inpatient Hospital Stay (HOSPITAL_COMMUNITY): Payer: MEDICAID

## 2012-07-10 DIAGNOSIS — F17200 Nicotine dependence, unspecified, uncomplicated: Secondary | ICD-10-CM | POA: Diagnosis present

## 2012-07-10 LAB — COMPREHENSIVE METABOLIC PANEL
ALT: 63 U/L — ABNORMAL HIGH (ref 0–53)
AST: 52 U/L — ABNORMAL HIGH (ref 0–37)
Albumin: 3.3 g/dL — ABNORMAL LOW (ref 3.5–5.2)
Alkaline Phosphatase: 127 U/L — ABNORMAL HIGH (ref 39–117)
BUN: 11 mg/dL (ref 6–23)
CO2: 23 mEq/L (ref 19–32)
Calcium: 9.6 mg/dL (ref 8.4–10.5)
Chloride: 98 mEq/L (ref 96–112)
Creatinine, Ser: 0.73 mg/dL (ref 0.50–1.35)
GFR calc Af Amer: >90 mL/min (ref >90)
GFR calc non Af Amer: >90 mL/min (ref >90)
Glucose, Bld: 108 mg/dL — ABNORMAL HIGH (ref 70–99)
Potassium: 5.2 mEq/L — ABNORMAL HIGH (ref 3.5–5.1)
Sodium: 131 mEq/L — ABNORMAL LOW (ref 135–145)
Total Bilirubin: 0.4 mg/dL (ref 0.3–1.2)
Total Protein: 7.3 g/dL (ref 6.0–8.3)

## 2012-07-10 LAB — CBC
HCT: 38.4 % — ABNORMAL LOW (ref 39.0–52.0)
MCHC: 33.1 g/dL (ref 30.0–36.0)
MCV: 91.9 fL (ref 78.0–100.0)
RDW: 13.9 % (ref 11.5–15.5)

## 2012-07-10 LAB — BASIC METABOLIC PANEL
BUN: 14 mg/dL (ref 6–23)
CO2: 24 mEq/L (ref 19–32)
Calcium: 10 mg/dL (ref 8.4–10.5)
Creatinine, Ser: 0.76 mg/dL (ref 0.50–1.35)
GFR calc non Af Amer: >90 mL/min (ref >90)
Glucose, Bld: 87 mg/dL (ref 70–99)

## 2012-07-10 LAB — CULTURE, BLOOD (ROUTINE X 2): Culture: NO GROWTH

## 2012-07-10 MED ORDER — OXYCODONE HCL 5 MG PO TABS
5.0000 mg | ORAL_TABLET | ORAL | Status: DC | PRN
Start: 2012-07-10 — End: 2012-07-10

## 2012-07-10 MED ORDER — HYDROMORPHONE HCL PF 1 MG/ML IJ SOLN
1.0000 mg | INTRAMUSCULAR | Status: DC | PRN
  Administered 2012-07-09: 13:00:00 via INTRAVENOUS
  Administered 2012-07-10 – 2012-07-11 (×7): 1 mg via INTRAVENOUS
  Filled 2012-07-10 (×7): qty 1

## 2012-07-10 MED ORDER — MORPHINE SULFATE ER 15 MG PO TBCR
15.0000 mg | EXTENDED_RELEASE_TABLET | Freq: Two times a day (BID) | ORAL | Status: DC
  Administered 2012-07-10 – 2012-07-11 (×3): 15 mg via ORAL
  Filled 2012-07-10 (×3): qty 1

## 2012-07-10 MED ORDER — HYDROCODONE-ACETAMINOPHEN 10-325 MG PO TABS
1.0000 | ORAL_TABLET | Freq: Four times a day (QID) | ORAL | Status: DC | PRN
  Administered 2012-07-10: 2 via ORAL
  Filled 2012-07-10: qty 2

## 2012-07-10 MED ORDER — METHOCARBAMOL 500 MG PO TABS
500.0000 mg | ORAL_TABLET | Freq: Four times a day (QID) | ORAL | Status: DC | PRN
  Filled 2012-07-10: qty 2

## 2012-07-10 MED ORDER — SODIUM CHLORIDE 0.9 % IV SOLN: INTRAVENOUS | Status: DC

## 2012-07-10 MED ORDER — OXYCODONE HCL 5 MG PO TABS
10.0000 mg | ORAL_TABLET | ORAL | Status: DC | PRN
  Administered 2012-07-10 – 2012-07-12 (×10): 10 mg via ORAL
  Filled 2012-07-10 (×10): qty 2

## 2012-07-10 MED ORDER — CYCLOBENZAPRINE HCL 5 MG PO TABS
7.5000 mg | ORAL_TABLET | Freq: Two times a day (BID) | ORAL | Status: DC
  Administered 2012-07-10 – 2012-07-12 (×5): 7.5 mg via ORAL
  Filled 2012-07-10 (×6): qty 1.5
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
## 2012-07-11 LAB — CBC
MCH: 30.8 pg (ref 26.0–34.0)
MCHC: 33.2 g/dL (ref 30.0–36.0)
MCV: 92.9 fL (ref 78.0–100.0)
Platelets: 646 10*3/uL — ABNORMAL HIGH (ref 150–400)
RDW: 13.9 % (ref 11.5–15.5)

## 2012-07-11 LAB — BASIC METABOLIC PANEL
CO2: 27 mEq/L (ref 19–32)
Calcium: 9.5 mg/dL (ref 8.4–10.5)
Creatinine, Ser: 0.8 mg/dL (ref 0.50–1.35)
GFR calc Af Amer: >90 mL/min (ref >90)
GFR calc non Af Amer: >90 mL/min (ref >90)
Sodium: 136 mEq/L (ref 135–145)

## 2012-07-11 MED ORDER — MORPHINE SULFATE ER 15 MG PO TBCR
30.0000 mg | EXTENDED_RELEASE_TABLET | Freq: Two times a day (BID) | ORAL | Status: DC
  Administered 2012-07-11 – 2012-07-12 (×2): 30 mg via ORAL
  Filled 2012-07-11 (×2): qty 2

## 2012-07-11 NOTE — Evaluation (Signed)
Physical Therapy Evaluation Patient Details Name: [name] MRN: [id number] DOB: [birth year] Today's Date: 07/11/2012 Time: 8295-6213 PT Time Calculation (min): 17 min  PT Assessment / Plan / Recommendation Clinical Impression  Pt. s/p PNA and ORIF clavicle fracture.  Will see a few sessions of PT to perform balance test and practice up and down steps.  Ambulating well and safe without device.      PT Assessment  Patient needs continued PT services    Follow Up Recommendations  No PT follow up    Does the patient have the potential to tolerate intense rehabilitation      Barriers to Discharge Decreased caregiver support      Equipment Recommendations  None recommended by PT    Recommendations for Other Services     Frequency Min 3X/week    Precautions / Restrictions Precautions Precautions: Shoulder Restrictions Weight Bearing Restrictions: No LUE Weight Bearing: Weight bearing as tolerated   Pertinent Vitals/Pain VSS, Some pain      Mobility  Bed Mobility Bed Mobility: Supine to Sit Supine to Sit: 7: Independent Sit to Supine: 7: Independent Transfers Transfers: Sit to Stand;Stand to Sit Sit to Stand: 4: Min guard;With upper extremity assist;From bed Stand to Sit: 4: Min guard;With upper extremity assist;To bed Ambulation/Gait Ambulation/Gait Assistance: 4: Min guard Ambulation Distance (Feet): 150 Feet Assistive device: None Ambulation/Gait Assistance Details: Ambulated with safe gait without LOB in controlled environment. Gait Pattern: Step-through pattern;Decreased stride length Gait velocity: decreased Stairs: No Wheelchair Mobility Wheelchair Mobility: No              PT Diagnosis: Difficulty walking;Generalized weakness  PT Problem List: Decreased balance;Decreased mobility;Pain PT Treatment Interventions: Gait training;Stair training;Functional mobility training;Therapeutic activities;Balance training;Patient/family education   PT  Goals Acute Rehab PT Goals PT Goal Formulation: With patient Time For Goal Achievement: 07/18/12 Potential to Achieve Goals: Good Pt will go Supine/Side to Sit: Independently PT Goal: Supine/Side to Sit - Progress: Goal set today Pt will go Sit to Supine/Side: Independently PT Goal: Sit to Supine/Side - Progress: Goal set today Pt will go Sit to Stand: Independently PT Goal: Sit to Stand - Progress: Goal set today Pt will go Stand to Sit: Independently PT Goal: Stand to Sit - Progress: Goal set today Pt will Ambulate: >150 feet;Independently PT Goal: Ambulate - Progress: Goal set today Pt will Go Up / Down Stairs: Flight;with modified independence;with rail(s) PT Goal: Up/Down Stairs - Progress: Goal set today Additional Goals Additional Goal #1: Pt to perform Berg test with score >48/56.   PT Goal: Additional Goal #1 - Progress: Goal set today  Visit Information  Last PT Received On: 07/11/12 Assistance Needed: +1    Subjective Data  Subjective: "I just need somewhere to go." Patient Stated Goal: To go to [employer] or [facility]   Prior Functioning  Home Living Type of Home:  (? to [facility] homeless [facility]) Home Access: Stairs to [employer] [facility] house homeless shelter) Secretary/administrator of Steps: 20-30 Entrance Stairs-Rails: Right Home Layout: Multi-level    Cognition  Overall Cognitive Status: History of cognitive impairments - at baseline Area of Impairment: Memory Arousal/Alertness: Awake/alert Orientation Level: Appears intact for tasks assessed Behavior During Session: [facility] for tasks performed Memory: Decreased recall of precautions Awareness of Errors: Assistance required to identify errors made    Extremity/Trunk Assessment Right Lower Extremity Assessment RLE ROM/Strength/Tone: [facility] for tasks assessed Left Lower Extremity Assessment LLE ROM/Strength/Tone: [facility] for tasks assessed Trunk Assessment Trunk Assessment: Normal  Balance Static Standing  Balance Static Standing - Balance Support: Right upper extremity supported;During functional activity Static Standing - Level of Assistance: 5: Stand by assistance  End of Session PT - End of Session Equipment Utilized During Treatment: Gait belt Activity Tolerance: Patient tolerated treatment well;Patient limited by pain Patient left: with call bell/phone within reach;in bed Nurse Communication: Mobility status       INGOLD,Stacye Noori 07/11/2012, 10:08 AM  [facility] [phone] (pager)

## 2012-07-11 NOTE — Progress Notes (Signed)
Subjective: 2 Days Post-Op Procedure(s) (LRB): OPEN REDUCTION INTERNAL FIXATION (ORIF) CLAVICULAR FRACTURE (Left) Patient reports pain as moderate.  Does feel better with clavicle fixed. Reports less sensation of the bones moving.  Objective: Vital signs in last 24 hours: Temp:  [98.1 F (36.7 C)-98.4 F (36.9 C)] 98.1 F (36.7 C) (10/10 0428) Pulse Rate:  [60-62] 60  (10/10 0428) Resp:  [18] 18  (10/10 0428) BP: (107-127)/(58-76) 127/76 mmHg (10/10 0428) SpO2:  [93 %-94 %] 93 % (10/10 0428) Weight:  [81.058 kg (178 lb 11.2 oz)] 81.058 kg (178 lb 11.2 oz) (10/10 0428)  Intake/Output from previous day: 10/09 0701 - 10/10 0700 In: 2544 [P.O.:840; I.V.:1700; IV Piggyback:4] Out: 1000 [Urine:1000] Intake/Output this shift: Total I/O In: 1050 [P.O.:750; I.V.:300] Out: 600 [Urine:600]   Basename 07/11/12 0515 07/10/12 0525 07/09/12 0445  HGB 12.1* 12.7* 12.7*    Basename 07/11/12 0515 07/10/12 0525  WBC 13.7* 16.6*  RBC 3.93* 4.18*  HCT 36.5* 38.4*  PLT 646* 562*    Basename 07/11/12 0515 07/10/12 1712  NA 136 133*  K 4.4 4.6  CL 101 100  CO2 27 24  BUN 14 14  CREATININE 0.80 0.76  GLUCOSE 88 87  CALCIUM 9.5 10.0   No results found for this basename: LABPT:2,INR:2 in the last 72 hours  Incision: no drainage  Assessment/Plan: 2 Days Post-Op Procedure(s) (LRB): OPEN REDUCTION INTERNAL FIXATION (ORIF) CLAVICULAR FRACTURE (Left) Plan for discharge tomorrow possibly to [facility]. Continue sling for left shoulder.  Jerris Fleer Y 07/11/2012, 6:09 PM
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
## 2012-07-11 NOTE — Progress Notes (Signed)
Occupational Therapy Treatment Patient Details Name: [name] MRN: [id number] DOB: 10-22-1955 Today's Date: 07/11/2012 Time: 1000-1016 OT Time Calculation (min): 16 min  OT Assessment / Plan / Recommendation Comments on Treatment Session Pt progressing with AROM/AAROM HEP.  Pt unable to reach LUE FF/Abudction to 90 at this time, but is progressing. Provided pt with HEP handout.  Pt verbalized understanding of precautions (not to exceed 90 with FF and abduction and no lifting >5lbs).  At this time, no further acute OT needs.  Pt to continue with HEP while in hospital to increase LUE ROM.    Follow Up Recommendations  No OT follow up    Barriers to Discharge       Equipment Recommendations  None recommended by OT    Recommendations for Other Services    Frequency Min 2X/week   Plan Discharge plan remains appropriate    Precautions / Restrictions Precautions Precautions: Shoulder (FF and abduction to 90. AROM within pain tolerance to that l) Type of Shoulder Precautions: FF and ABD to 90 A/AAROM. WBAT. no lift over 5 lbs Required Braces or Orthoses: Other Brace/Splint Other Brace/Splint: LUE sling for comfort   Pertinent Vitals/Pain See vitals    ADL  Eating/Feeding: Performed;Independent Where Assessed - Eating/Feeding: Bed level Upper Body Bathing: Simulated;Modified independent Where Assessed - Upper Body Bathing: Unsupported sitting Upper Body Dressing: Performed;Modified independent Where Assessed - Upper Body Dressing: Unsupported sitting ADL Comments: Pt able to reach all aspects of right side using LUE within pain and ROM limits.  Pt able to verbalize and demonstrate understanding of UB dressing technique by threading LUE through clothing first.  Encouraged pt to continue using LUE for light ADL tasks and pt verbalized understanding.  Provided pt with exercise handout for FF and Abduction to 90 degrees.  Pt LUE sling for comfort only but pt mod I with  donning/doffing.  Pt hasn't been using sling in room per pt report.   OT Diagnosis:    OT Problem List:   OT Treatment Interventions:     OT Goals ADL Goals Pt Will Perform Upper Body Bathing: with modified independence;Unsupported ADL Goal: Upper Body Bathing - Progress: Met Pt Will Perform Upper Body Dressing: with modified independence;Unsupported ADL Goal: Upper Body Dressing - Progress: Met ADL Goal: Additional Goal #1 - Progress: Met Additional ADL Goal #2: donn/doff sling mod i level Arm Goals Pt Will Perform AROM: 1 set;Left upper extremity;to maintain range of motion;Other (comment) Arm Goal: AROM - Progress: Met  Visit Information  Last OT Received On: 07/11/12 Assistance Needed: +1    Subjective Data      Prior Functioning       Cognition  Overall Cognitive Status: History of cognitive impairments - at baseline Arousal/Alertness: Awake/alert Orientation Level: Appears intact for tasks assessed Behavior During Session: [facility] for tasks performed Cognition - Other Comments: Cognition WFL during session. Pt able to recall weight restriction of nothing over 5 lbs.    Mobility  Shoulder Instructions Bed Mobility Bed Mobility: Supine to Sit;Sit to Supine Supine to Sit: 7: Independent Sit to Supine: 7: Independent Transfers Transfers: Not assessed   Donning/doffing shirt without moving shoulder: Modified independent Method for sponge bathing under operated UE: Modified independent Donning/doffing sling/immobilizer: Modified independent ROM for elbow, wrist and digits of operated UE: Independent   Exercises  Shoulder Exercises Shoulder Flexion: AAROM;Self ROM;Left;10 reps;Seated (to 60) Shoulder ABduction: AAROM;Left;10 reps;Seated (to 30) Elbow Flexion: AROM;Left;10 reps;Seated Elbow Extension: AROM;Left;10 reps;Seated   Balance  End of Session OT - End of Session Equipment Utilized During Treatment:  (LUE sling) Activity Tolerance: Patient tolerated  treatment well Patient left: in bed;with call bell/phone within reach Nurse Communication:  (HEP)  GO    07/11/2012 Cipriano Mile OTR/L Pager [phone] Office [phone]  Cipriano Mile 07/11/2012, 3:18 PM

## 2012-07-12 ENCOUNTER — Encounter (HOSPITAL_COMMUNITY): Payer: Self-pay | Admitting: Orthopedic Surgery

## 2012-07-12 MED ORDER — DSS 100 MG PO CAPS: 100.0000 mg | ORAL_CAPSULE | Freq: Two times a day (BID) | ORAL | Status: DC

## 2012-07-12 MED ORDER — CYCLOBENZAPRINE HCL 7.5 MG PO TABS: 7.5000 mg | ORAL_TABLET | Freq: Two times a day (BID) | ORAL | Status: DC

## 2012-07-12 MED ORDER — MORPHINE SULFATE ER 30 MG PO TBCR: 30.0000 mg | EXTENDED_RELEASE_TABLET | Freq: Two times a day (BID) | ORAL | Status: DC

## 2012-07-12 NOTE — Progress Notes (Signed)
Pt D/C per W/C with  all belongings accompanied by RN - Ex wife driving pt to [employer]. Marisa Cyphers RN
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
## 2012-07-12 NOTE — Progress Notes (Signed)
Physical Therapy Treatment Patient Details Name: [name] MRN: [id number] DOB: 08/13/1956 Today's Date: 07/12/2012 Time: 4098-1191 PT Time Calculation (min): 27 min  PT Assessment / Plan / Recommendation Comments on Treatment Session  Patient scored 24/24 on Dynamic Gait Index balance assessment - no fall risk.  Patient able to negotiate flight of stairs independently.  Patient safe for discharge.    Follow Up Recommendations  No PT follow up     Does the patient have the potential to tolerate intense rehabilitation     Barriers to Discharge        Equipment Recommendations  None recommended by PT    Recommendations for Other Services    Frequency Min 3X/week   Plan Discharge plan remains appropriate;Frequency remains appropriate    Precautions / Restrictions Precautions Precautions: Shoulder Required Braces or Orthoses: Other Brace/Splint Other Brace/Splint: LUE sling for comfort Restrictions Weight Bearing Restrictions: No LUE Weight Bearing: Weight bearing as tolerated   Pertinent Vitals/Pain     Mobility  Transfers Transfers: Sit to Stand;Stand to Sit Sit to Stand: 7: Independent;With upper extremity assist;With armrests;From chair/3-in-1 Stand to Sit: 7: Independent;With upper extremity assist;With armrests;To chair/3-in-1 Details for Transfer Assistance: Proper safe technique used. Ambulation/Gait Ambulation/Gait Assistance: 7: Independent Ambulation Distance (Feet): 300 Feet Assistive device: None Ambulation/Gait Assistance Details: Good balance with gait.  Gait pattern WFL. Gait Pattern: Within Functional Limits Gait velocity: WFL Stairs: Yes Stairs Assistance: 6: Modified independent (Device/Increase time) Stairs Assistance Details (indicate cue type and reason): Instructed patient on safe negotiation of stairs.  Able to complete 4 steps without rails.  Used one rail for remainder of stairs. Stair Management Technique: No rails;One rail  Right;Alternating pattern;Forwards Number of Stairs: 10       PT Goals Acute Rehab PT Goals PT Goal: Sit to Stand - Progress: Met PT Goal: Stand to Sit - Progress: Met PT Goal: Ambulate - Progress: Met PT Goal: Up/Down Stairs - Progress: Met Additional Goals PT Goal: Additional Goal #1 - Progress: Met (Performed DGI - more dynamic assessment.  Scored 100%)  Visit Information  Last PT Received On: 07/12/12 Assistance Needed: +1    Subjective Data  Subjective: "I'm not nauseated any more"   Cognition  Overall Cognitive Status: History of cognitive impairments - at baseline Arousal/Alertness: Awake/alert Orientation Level: Appears intact for tasks assessed Behavior During Session: [facility] for tasks performed    Balance  Balance Balance Assessed: Yes Standardized Balance Assessment Standardized Balance Assessment: Dynamic Gait Index Dynamic Gait Index Level Surface: Normal Change in Gait Speed: Normal Gait with Horizontal Head Turns: Normal Gait with Vertical Head Turns: Normal Gait and Pivot Turn: Normal Step Over Obstacle: Normal Step Around Obstacles: Normal Steps: Normal Total Score: 24   End of Session PT - End of Session Equipment Utilized During Treatment:  (LUE sling) Activity Tolerance: Patient tolerated treatment well Patient left: in chair;with call bell/phone within reach;with family/visitor present;with nursing in room Nurse Communication: Mobility status   GP     Vena Austria 07/12/2012, 11:57 AM Durenda Hurt. Renaldo Fiddler, [facility] Acute Rehab Services Pager [phone]

## 2012-07-12 NOTE — Discharge Summary (Signed)
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    AMPHETMU  NONE DETECTED  07/03/2012 2055    THCU  NONE DETECTED  07/03/2012 2055    LABBARB  NONE DETECTED  07/03/2012 2055    Micro Results:  Recent Results (from the past 240 hour(s))   CULTURE, BLOOD (ROUTINE X 2) Status: Normal    Collection Time    07/03/12 8:23 PM   Component  Value  Range  Status  Comment    Specimen Description  BLOOD RIGHT HAND    Final     Special Requests  BOTTLES DRAWN AEROBIC ONLY 5CC   Final     Culture Setup Time  07/04/2012 02:40   Final     Culture  NO GROWTH 5 DAYS   Final     Report Status  07/10/2012 FINAL   Final    CULTURE, BLOOD (ROUTINE X 2) Status: Normal    Collection Time    07/03/12 8:35 PM   Component  Value  Range  Status  Comment    Specimen Description  BLOOD RIGHT HAND   Final     Special Requests    Final     Value:  BOTTLES DRAWN AEROBIC AND ANAEROBIC BLUE 5CC RED 3CC    Culture Setup Time  07/04/2012 02:40   Final     Culture  NO GROWTH 5 DAYS   Final     Report Status  07/10/2012 FINAL   Final    CULTURE, EXPECTORATED SPUTUM-ASSESSMENT Status: Normal    Collection Time  07/04/12 5:19 AM   Component  Value  Range  Status  Comment    Specimen Description  Expect. Sput   Final     Special Requests  NONE   Final     Sputum evaluation    Final     Value:  THIS SPECIMEN IS ACCEPTABLE. RESPIRATORY CULTURE REPORT TO FOLLOW.    Report Status  07/04/2012 FINAL   Final    CULTURE, RESPIRATORY Status: Normal    Collection Time    07/04/12 5:19 AM   Component  Value  Range  Status  Comment    Specimen Description  SPUTUM   Final     Special Requests  NONE   Final     Gram Stain    Final     Value:  FEW WBC PRESENT,BOTH PMN AND MONONUCLEAR     RARE SQUAMOUS EPITHELIAL CELLS PRESENT     NO ORGANISMS SEEN    Culture  FEW YEAST CONSISTENT WITH CANDIDA SPECIES   Final     Report Status  07/06/2012 FINAL   Final    BODY FLUID CULTURE Status: Normal    Collection Time    07/05/12 2:18 PM   Component  Value  Range  Status  Comment    Specimen Description  FLUID PARACENTESIS   Final     Special Requests  TUBE @ 60CC   Final     Gram Stain    Final     Value:  RARE WBC PRESENT, PREDOMINANTLY MONONUCLEAR     NO SQUAMOUS EPITHELIAL CELLS SEEN     NO ORGANISMS SEEN    Culture  NO GROWTH 3 DAYS   Final     Report Status  07/08/2012 FINAL   Final    AFB CULTURE WITH SMEAR Status: Normal  (Preliminary result)    Collection Time    07/05/12 2:18 PM   Component  Value  Range  Status  Comment    Specimen Description  FLUID PARACENTESIS   Final     Special Requests  TUBE @ 60CC   Final     ACID FAST SMEAR  NO ACID FAST BACILLI SEEN   Final     Culture    Final     Value:  CULTURE WILL BE EXAMINED FOR 6 WEEKS BEFORE ISSUING A FINAL REPORT    Report Status  PENDING   Incomplete    SURGICAL PCR SCREEN Status: Normal    Collection Time    07/08/12 12:03 AM   Component  Value  Range  Status  Comment    MRSA, PCR  NEGATIVE  NEGATIVE  Final     Staphylococcus aureus  NEGATIVE  NEGATIVE  Final       Signed: Ky Barban 07/12/2012, 6:00PM  Time Spent on Discharge: 60 minutes

## 2012-07-12 NOTE — Progress Notes (Signed)
Internal Medicine Teaching Service Attending Dr.Ticia Virgo I have examined the patient at bedside today and discussed the management of the Patient with the resident team.Patient waiting for safe discharge location. Especially given his requirement for modified diet and risk of aspiration pneumonia.

## 2012-07-12 NOTE — Progress Notes (Signed)
Went over all D/C instructions with pt and his ex-wife including home meds, prescriptions, follow up appts and restrictions for Lt shoulder postr op care. Clean dressing Allevyn applied lightly over steristrips on Lt shoulder. Smaller Allevyn dressing applied to Lt elbow. Wife says she will drive pt to [facility] and take him to get his prescriptions filled.
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
## 2012-07-25 ENCOUNTER — Encounter: Payer: Self-pay | Admitting: Internal Medicine

## 2012-07-25 ENCOUNTER — Ambulatory Visit (INDEPENDENT_AMBULATORY_CARE_PROVIDER_SITE_OTHER): Payer: Self-pay | Admitting: Internal Medicine

## 2012-07-25 VITALS — BP 124/72 | HR 62 | Ht 72.0 in | Wt 174.3 lb

## 2012-07-25 DIAGNOSIS — J189 Pneumonia, unspecified organism: Secondary | ICD-10-CM

## 2012-07-25 DIAGNOSIS — Z590 Homelessness: Secondary | ICD-10-CM

## 2012-07-25 DIAGNOSIS — S42002A Fracture of unspecified part of left clavicle, initial encounter for closed fracture: Secondary | ICD-10-CM

## 2012-07-25 DIAGNOSIS — E039 Hypothyroidism, unspecified: Secondary | ICD-10-CM

## 2012-07-25 DIAGNOSIS — R1310 Dysphagia, unspecified: Secondary | ICD-10-CM

## 2012-07-25 DIAGNOSIS — X58XXXA Exposure to other specified factors, initial encounter: Secondary | ICD-10-CM

## 2012-07-25 DIAGNOSIS — E0590 Thyrotoxicosis, unspecified without thyrotoxic crisis or storm: Secondary | ICD-10-CM

## 2012-07-25 DIAGNOSIS — S42009A Fracture of unspecified part of unspecified clavicle, initial encounter for closed fracture: Secondary | ICD-10-CM

## 2012-07-25 DIAGNOSIS — Z598 Other problems related to housing and economic circumstances: Secondary | ICD-10-CM
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
## 2012-07-25 NOTE — Patient Instructions (Signed)
We have given you instructions on applying for the [facility], for discounts on office visits and medications.  We have sent a referral to Social Work to help with your living situation.  Our social worker will call you in the next couple of days.  We will wait to get your labs at your next visit, so that they will be less expensive with the orange card.  Please return for a follow-up visit and chest x-ray in 3-4 weeks.

## 2012-07-26 DIAGNOSIS — E0590 Thyrotoxicosis, unspecified without thyrotoxic crisis or storm: Secondary | ICD-10-CM | POA: Insufficient documentation

## 2012-07-26 DIAGNOSIS — Z598 Other problems related to housing and economic circumstances: Secondary | ICD-10-CM | POA: Insufficient documentation
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
## 2012-07-26 NOTE — Assessment & Plan Note (Signed)
The patient notes that his dysphagia has improved since hospitalization, but has not entirely resolved.  We did not have time to fully address this issue today, will address at future visits.

## 2012-07-26 NOTE — Assessment & Plan Note (Signed)
HCAP appears to have resolved.  Patient still has lingering cough productive of [name] sputum.  Will have patient return for f/u cxr and appointment 6 weeks after completing therapy to ensure resolution. -CXR in about 3-4 weeks

## 2012-07-26 NOTE — Assessment & Plan Note (Signed)
The patient was noted to have subclinical hyperthyroidism during his last hospitalization.  No history of thyroid dysfunction, no FH of thyroid dysfunction.  We plan to repeat thyroid function tests, but patient is currently trying to obtain orange card.  Labs will be cheaper after obtaining orange card.  Since this is not urgent, we'll defer for now. -check TSH, free T3, free T4 at next visit
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
## 2012-08-02 ENCOUNTER — Encounter (HOSPITAL_COMMUNITY): Payer: Self-pay | Admitting: Emergency Medicine

## 2012-08-02 ENCOUNTER — Emergency Department (HOSPITAL_COMMUNITY)
Admission: EM | Admit: 2012-08-02 | Discharge: 2012-08-02 | Disposition: A | Discharge status: Home / Self Care | Payer: Self-pay | Attending: Emergency Medicine | Admitting: Emergency Medicine

## 2012-08-02 DIAGNOSIS — F17200 Nicotine dependence, unspecified, uncomplicated: Secondary | ICD-10-CM | POA: Insufficient documentation

## 2012-08-02 DIAGNOSIS — K219 Gastro-esophageal reflux disease without esophagitis: Secondary | ICD-10-CM | POA: Insufficient documentation

## 2012-08-02 DIAGNOSIS — Z87828 Personal history of other (healed) physical injury and trauma: Secondary | ICD-10-CM | POA: Insufficient documentation

## 2012-08-02 DIAGNOSIS — Z8679 Personal history of other diseases of the circulatory system: Secondary | ICD-10-CM | POA: Insufficient documentation

## 2012-08-02 DIAGNOSIS — G8929 Other chronic pain: Secondary | ICD-10-CM | POA: Insufficient documentation

## 2012-08-02 DIAGNOSIS — Z8739 Personal history of other diseases of the musculoskeletal system and connective tissue: Secondary | ICD-10-CM | POA: Insufficient documentation

## 2012-08-02 DIAGNOSIS — R109 Unspecified abdominal pain: Secondary | ICD-10-CM

## 2012-08-02 DIAGNOSIS — Z8659 Personal history of other mental and behavioral disorders: Secondary | ICD-10-CM | POA: Insufficient documentation

## 2012-08-02 DIAGNOSIS — R112 Nausea with vomiting, unspecified: Secondary | ICD-10-CM | POA: Insufficient documentation

## 2012-08-02 DIAGNOSIS — I739 Peripheral vascular disease, unspecified: Secondary | ICD-10-CM | POA: Insufficient documentation

## 2012-08-02 DIAGNOSIS — R197 Diarrhea, unspecified: Secondary | ICD-10-CM | POA: Insufficient documentation

## 2012-08-02 DIAGNOSIS — R1084 Generalized abdominal pain: Secondary | ICD-10-CM | POA: Insufficient documentation

## 2012-08-02 DIAGNOSIS — Z79899 Other long term (current) drug therapy: Secondary | ICD-10-CM | POA: Insufficient documentation

## 2012-08-02 DIAGNOSIS — Z8719 Personal history of other diseases of the digestive system: Secondary | ICD-10-CM | POA: Insufficient documentation

## 2012-08-02 LAB — CBC WITH DIFFERENTIAL/PLATELET
Basophils Absolute: 0 K/uL (ref 0.0–0.1)
Basophils Relative: 0 % (ref 0–1)
Eosinophils Absolute: 0 K/uL (ref 0.0–0.7)
Eosinophils Relative: 0 % (ref 0–5)
HCT: 44.1 % (ref 39.0–52.0)
Hemoglobin: 15 g/dL (ref 13.0–17.0)
Lymphocytes Relative: 19 % (ref 12–46)
Lymphs Abs: 1.8 K/uL (ref 0.7–4.0)
MCH: 30.6 pg (ref 26.0–34.0)
MCHC: 34 g/dL (ref 30.0–36.0)
MCV: 90 fL (ref 78.0–100.0)
Monocytes Absolute: 0.3 K/uL (ref 0.1–1.0)
Monocytes Relative: 3 % (ref 3–12)
Neutro Abs: 7.6 K/uL (ref 1.7–7.7)
Neutrophils Relative %: 78 % — ABNORMAL HIGH (ref 43–77)
Platelets: 343 K/uL (ref 150–400)
RBC: 4.9 MIL/uL (ref 4.22–5.81)
RDW: 13.3 % (ref 11.5–15.5)
WBC: 9.7 K/uL (ref 4.0–10.5)

## 2012-08-02 LAB — COMPREHENSIVE METABOLIC PANEL
ALT: 104 U/L — ABNORMAL HIGH (ref 0–53)
Alkaline Phosphatase: 152 U/L — ABNORMAL HIGH (ref 39–117)
BUN: 18 mg/dL (ref 6–23)
CO2: 24 mEq/L (ref 19–32)
Calcium: 10.6 mg/dL — ABNORMAL HIGH (ref 8.4–10.5)
GFR calc Af Amer: >90 mL/min (ref >90)
GFR calc non Af Amer: >90 mL/min (ref >90)
Glucose, Bld: 148 mg/dL — ABNORMAL HIGH (ref 70–99)
Potassium: 4.4 mEq/L (ref 3.5–5.1)
Sodium: 138 mEq/L (ref 135–145)

## 2012-08-02 LAB — LIPASE, BLOOD: Lipase: 38 U/L (ref 11–59)

## 2012-08-02 MED ORDER — ONDANSETRON HCL 4 MG/2ML IJ SOLN
4.0000 mg | Freq: Once | INTRAMUSCULAR | Status: AC
  Administered 2012-08-02: 4 mg via INTRAVENOUS
  Filled 2012-08-02: qty 2

## 2012-08-02 MED ORDER — MORPHINE SULFATE 4 MG/ML IJ SOLN
4.0000 mg | Freq: Once | INTRAMUSCULAR | Status: AC
  Administered 2012-08-02: 4 mg via INTRAVENOUS
  Filled 2012-08-02: qty 1

## 2012-08-02 MED ORDER — SODIUM CHLORIDE 0.9 % IV BOLUS (SEPSIS)
1000.0000 mL | Freq: Once | INTRAVENOUS | Status: AC
  Administered 2012-08-02: 1000 mL via INTRAVENOUS

## 2012-08-02 MED ORDER — MORPHINE SULFATE 4 MG/ML IJ SOLN: 4.0000 mg | Freq: Once | INTRAMUSCULAR | Status: DC

## 2012-08-02 NOTE — ED Notes (Signed)
Patient complaining of nausea, notified MD.

## 2012-08-02 NOTE — ED Notes (Addendum)
Pt c/o abdominal pain and nausea. Pt states he has had episodes of chills and has a hx on pancreatitis.
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
## 2012-08-04 ENCOUNTER — Encounter (HOSPITAL_COMMUNITY): Payer: Self-pay | Admitting: Physical Medicine and Rehabilitation

## 2012-08-04 ENCOUNTER — Emergency Department (HOSPITAL_COMMUNITY)
Admission: EM | Admit: 2012-08-04 | Discharge: 2012-08-05 | Disposition: A | Discharge status: Home / Self Care | Payer: Self-pay | Attending: Emergency Medicine | Admitting: Emergency Medicine

## 2012-08-04 DIAGNOSIS — M545 Low back pain, unspecified: Secondary | ICD-10-CM | POA: Insufficient documentation

## 2012-08-04 DIAGNOSIS — Z8719 Personal history of other diseases of the digestive system: Secondary | ICD-10-CM | POA: Insufficient documentation

## 2012-08-04 DIAGNOSIS — Z8669 Personal history of other diseases of the nervous system and sense organs: Secondary | ICD-10-CM | POA: Insufficient documentation

## 2012-08-04 DIAGNOSIS — Z8701 Personal history of pneumonia (recurrent): Secondary | ICD-10-CM | POA: Insufficient documentation

## 2012-08-04 DIAGNOSIS — F1011 Alcohol abuse, in remission: Secondary | ICD-10-CM | POA: Insufficient documentation

## 2012-08-04 DIAGNOSIS — F1410 Cocaine abuse, uncomplicated: Secondary | ICD-10-CM | POA: Insufficient documentation

## 2012-08-04 DIAGNOSIS — F17200 Nicotine dependence, unspecified, uncomplicated: Secondary | ICD-10-CM | POA: Insufficient documentation

## 2012-08-04 DIAGNOSIS — Z8781 Personal history of (healed) traumatic fracture: Secondary | ICD-10-CM | POA: Insufficient documentation

## 2012-08-04 DIAGNOSIS — R1012 Left upper quadrant pain: Secondary | ICD-10-CM | POA: Insufficient documentation

## 2012-08-04 DIAGNOSIS — G8929 Other chronic pain: Secondary | ICD-10-CM | POA: Insufficient documentation

## 2012-08-04 DIAGNOSIS — Z8679 Personal history of other diseases of the circulatory system: Secondary | ICD-10-CM | POA: Insufficient documentation

## 2012-08-04 DIAGNOSIS — M129 Arthropathy, unspecified: Secondary | ICD-10-CM | POA: Insufficient documentation

## 2012-08-04 DIAGNOSIS — Z8709 Personal history of other diseases of the respiratory system: Secondary | ICD-10-CM | POA: Insufficient documentation

## 2012-08-04 DIAGNOSIS — F411 Generalized anxiety disorder: Secondary | ICD-10-CM | POA: Insufficient documentation

## 2012-08-04 DIAGNOSIS — R110 Nausea: Secondary | ICD-10-CM | POA: Insufficient documentation

## 2012-08-04 LAB — URINALYSIS, ROUTINE W REFLEX MICROSCOPIC
Ketones, ur: 15 mg/dL — AB
Leukocytes, UA: NEGATIVE
Nitrite: NEGATIVE
Protein, ur: NEGATIVE mg/dL
Urobilinogen, UA: 1 mg/dL (ref 0.0–1.0)

## 2012-08-04 LAB — CBC WITH DIFFERENTIAL/PLATELET
Eosinophils Relative: 0 % (ref 0–5)
Lymphocytes Relative: 40 % (ref 12–46)
Lymphs Abs: 3 10*3/uL (ref 0.7–4.0)
MCV: 88.4 fL (ref 78.0–100.0)
Neutrophils Relative %: 44 % (ref 43–77)
Platelets: 326 10*3/uL (ref 150–400)
RBC: 4.57 MIL/uL (ref 4.22–5.81)
WBC: 7.6 10*3/uL (ref 4.0–10.5)

## 2012-08-04 LAB — COMPREHENSIVE METABOLIC PANEL
Albumin: 3.8 g/dL (ref 3.5–5.2)
BUN: 20 mg/dL (ref 6–23)
Calcium: 9.4 mg/dL (ref 8.4–10.5)
Chloride: 101 mEq/L (ref 96–112)
Creatinine, Ser: 0.63 mg/dL (ref 0.50–1.35)
Total Bilirubin: 0.5 mg/dL (ref 0.3–1.2)
Total Protein: 7.5 g/dL (ref 6.0–8.3)

## 2012-08-04 MED ORDER — METOCLOPRAMIDE HCL 5 MG/ML IJ SOLN
10.0000 mg | Freq: Once | INTRAMUSCULAR | Status: AC
  Administered 2012-08-04: 10 mg via INTRAVENOUS
  Filled 2012-08-04: qty 2

## 2012-08-04 MED ORDER — HYDROCODONE-ACETAMINOPHEN 5-500 MG PO TABS: 1.0000 | ORAL_TABLET | Freq: Four times a day (QID) | ORAL | Status: DC | PRN

## 2012-08-04 MED ORDER — PANTOPRAZOLE SODIUM 40 MG IV SOLR
40.0000 mg | Freq: Once | INTRAVENOUS | Status: AC
  Administered 2012-08-04: 40 mg via INTRAVENOUS
  Filled 2012-08-04: qty 40

## 2012-08-04 MED ORDER — HYDROMORPHONE HCL PF 1 MG/ML IJ SOLN
1.0000 mg | Freq: Once | INTRAMUSCULAR | Status: AC
  Administered 2012-08-04: 1 mg via INTRAVENOUS
  Filled 2012-08-04: qty 1

## 2012-08-04 MED ORDER — ONDANSETRON HCL 4 MG PO TABS: 4.0000 mg | ORAL_TABLET | Freq: Four times a day (QID) | ORAL | Status: DC

## 2012-08-04 MED ORDER — ONDANSETRON HCL 4 MG/2ML IJ SOLN
4.0000 mg | Freq: Once | INTRAMUSCULAR | Status: AC
  Administered 2012-08-04: 4 mg via INTRAVENOUS
  Filled 2012-08-04: qty 2

## 2012-08-04 MED ORDER — SODIUM CHLORIDE 0.9 % IV BOLUS (SEPSIS)
1000.0000 mL | Freq: Once | INTRAVENOUS | Status: AC
  Administered 2012-08-04: 1000 mL via INTRAVENOUS

## 2012-08-04 NOTE — ED Notes (Signed)
The patient is AOx4 and comfortable with the discharge instructions. 

## 2012-08-04 NOTE — ED Notes (Signed)
The pt is alert urine collected waiting to be seen by the edp

## 2012-08-04 NOTE — ED Notes (Signed)
The pt  Is requesting pain and nausea meds.

## 2012-08-04 NOTE — ED Notes (Signed)
Pt presents to department for evaluation of diffuse abdominal pain. Ongoing since Friday, was seen at [facility] for same. States "I think this is my pancreatitis." 10/10 pain at the time. No relief with Percocet at home. Pt also states nausea. He is conscious alert and oriented x4. NAD.

## 2012-08-04 NOTE — ED Notes (Signed)
I gave the patient a cup of coffee, a cream, and 6 packs of sugar.

## 2012-08-04 NOTE — ED Notes (Signed)
Pt calling and requesting pain and nausea med.  edp aware

## 2012-08-04 NOTE — ED Notes (Signed)
Pt waiting for a disposition.  Nausea intermittently

## 2012-08-04 NOTE — ED Notes (Signed)
The pt has been given med

## 2012-08-04 NOTE — ED Notes (Signed)
Pt vomiting. edp notified 

## 2012-08-04 NOTE — ED Notes (Signed)
The pt is gagging but spitting up not vomiting.  edp notified

## 2012-08-04 NOTE — ED Provider Notes (Signed)
Medical screening examination/treatment/procedure(s) were performed by non-physician practitioner and as supervising physician I was immediately available for consultation/collaboration.  Derwood Kaplan, MD 08/04/12 2349

## 2012-08-04 NOTE — ED Notes (Signed)
Med given 
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
## 2012-08-04 NOTE — ED Notes (Signed)
Patient aware we need a urine sample.  

## 2012-08-05 LAB — OCCULT BLOOD, POC DEVICE: Fecal Occult Bld: NEGATIVE

## 2012-08-08 ENCOUNTER — Encounter: Payer: Self-pay | Admitting: *Deleted

## 2012-08-08 ENCOUNTER — Encounter (HOSPITAL_COMMUNITY): Payer: Self-pay

## 2012-08-08 ENCOUNTER — Emergency Department (HOSPITAL_COMMUNITY)
Admission: EM | Admit: 2012-08-08 | Discharge: 2012-08-08 | Disposition: A | Discharge status: Home / Self Care | Payer: Self-pay | Attending: Emergency Medicine | Admitting: Emergency Medicine

## 2012-08-08 DIAGNOSIS — Z8659 Personal history of other mental and behavioral disorders: Secondary | ICD-10-CM | POA: Insufficient documentation

## 2012-08-08 DIAGNOSIS — Z8679 Personal history of other diseases of the circulatory system: Secondary | ICD-10-CM | POA: Insufficient documentation

## 2012-08-08 DIAGNOSIS — Z87828 Personal history of other (healed) physical injury and trauma: Secondary | ICD-10-CM | POA: Insufficient documentation

## 2012-08-08 DIAGNOSIS — Z5900 Homelessness unspecified: Secondary | ICD-10-CM | POA: Insufficient documentation

## 2012-08-08 DIAGNOSIS — M545 Low back pain, unspecified: Secondary | ICD-10-CM | POA: Insufficient documentation

## 2012-08-08 DIAGNOSIS — M129 Arthropathy, unspecified: Secondary | ICD-10-CM | POA: Insufficient documentation

## 2012-08-08 DIAGNOSIS — R109 Unspecified abdominal pain: Secondary | ICD-10-CM | POA: Insufficient documentation

## 2012-08-08 DIAGNOSIS — G8929 Other chronic pain: Secondary | ICD-10-CM | POA: Insufficient documentation

## 2012-08-08 DIAGNOSIS — F17200 Nicotine dependence, unspecified, uncomplicated: Secondary | ICD-10-CM | POA: Insufficient documentation

## 2012-08-08 DIAGNOSIS — Z79899 Other long term (current) drug therapy: Secondary | ICD-10-CM | POA: Insufficient documentation

## 2012-08-08 DIAGNOSIS — Z8719 Personal history of other diseases of the digestive system: Secondary | ICD-10-CM | POA: Insufficient documentation

## 2012-08-08 DIAGNOSIS — R110 Nausea: Secondary | ICD-10-CM | POA: Insufficient documentation

## 2012-08-08 LAB — URINALYSIS, MICROSCOPIC ONLY
Glucose, UA: NEGATIVE mg/dL
Ketones, ur: 15 mg/dL — AB
Leukocytes, UA: NEGATIVE
Nitrite: NEGATIVE
Protein, ur: NEGATIVE mg/dL

## 2012-08-08 LAB — LIPASE, BLOOD: Lipase: 45 U/L (ref 11–59)

## 2012-08-08 LAB — COMPREHENSIVE METABOLIC PANEL
ALT: 109 U/L — ABNORMAL HIGH (ref 0–53)
AST: 61 U/L — ABNORMAL HIGH (ref 0–37)
Albumin: 3.8 g/dL (ref 3.5–5.2)
Alkaline Phosphatase: 123 U/L — ABNORMAL HIGH (ref 39–117)
Chloride: 98 mEq/L (ref 96–112)
Potassium: 3.4 mEq/L — ABNORMAL LOW (ref 3.5–5.1)
Total Bilirubin: 0.6 mg/dL (ref 0.3–1.2)

## 2012-08-08 LAB — CBC WITH DIFFERENTIAL/PLATELET
Basophils Absolute: 0 10*3/uL (ref 0.0–0.1)
Eosinophils Absolute: 0 10*3/uL (ref 0.0–0.7)
Eosinophils Relative: 0 % (ref 0–5)
MCH: 30.4 pg (ref 26.0–34.0)
MCV: 87.2 fL (ref 78.0–100.0)
Platelets: 391 10*3/uL (ref 150–400)
RDW: 12.6 % (ref 11.5–15.5)

## 2012-08-08 MED ORDER — ONDANSETRON HCL 4 MG/2ML IJ SOLN
4.0000 mg | Freq: Once | INTRAMUSCULAR | Status: AC
  Administered 2012-08-08: 4 mg via INTRAVENOUS
  Filled 2012-08-08: qty 2

## 2012-08-08 MED ORDER — MORPHINE SULFATE 4 MG/ML IJ SOLN
4.0000 mg | Freq: Once | INTRAMUSCULAR | Status: AC
  Administered 2012-08-08: 4 mg via INTRAVENOUS
  Filled 2012-08-08: qty 1

## 2012-08-08 MED ORDER — METOCLOPRAMIDE HCL 5 MG/ML IJ SOLN
10.0000 mg | Freq: Once | INTRAMUSCULAR | Status: AC
  Administered 2012-08-08: 10 mg via INTRAVENOUS
  Filled 2012-08-08: qty 2
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
## 2012-08-08 NOTE — Progress Notes (Unsigned)
Pt presented to clinic c/o abd pain, N&V since appr sat, he has been seen at [facility] and [facility], he is curled up in recliner in triage nurse office and stating he has not eaten nor drank po fluids in 4 days. Hard to gain info from him and his companion. i spoke w/ dr Aundria Rud and it is decided that pt will be assisted to ED due to lack of appropriate appt slots this pm in clinic, pt is agreeable. He is taken via wh/ch w/ trash can( spitting up clear liquid) to ED and report given to nurse along with snap shot.

## 2012-08-08 NOTE — ED Notes (Signed)
Pt compalisn of abd apin and nausea, unalbe to take oral intake, pt sts seen recently for same and did not have pancreatitis even thought he has a hx of pancreatitis.

## 2012-08-09 ENCOUNTER — Encounter: Payer: Self-pay | Admitting: Licensed Clinical Social Worker

## 2012-08-09 ENCOUNTER — Ambulatory Visit (INDEPENDENT_AMBULATORY_CARE_PROVIDER_SITE_OTHER): Payer: Self-pay | Admitting: Internal Medicine

## 2012-08-09 ENCOUNTER — Encounter: Payer: Self-pay | Admitting: Internal Medicine

## 2012-08-09 VITALS — BP 124/71 | HR 69 | Temp 97.9°F | Ht 72.0 in | Wt 161.4 lb

## 2012-08-09 DIAGNOSIS — K861 Other chronic pancreatitis: Secondary | ICD-10-CM

## 2012-08-09 DIAGNOSIS — S42009A Fracture of unspecified part of unspecified clavicle, initial encounter for closed fracture: Secondary | ICD-10-CM

## 2012-08-09 DIAGNOSIS — S42002A Fracture of unspecified part of left clavicle, initial encounter for closed fracture: Secondary | ICD-10-CM

## 2012-08-09 DIAGNOSIS — X58XXXA Exposure to other specified factors, initial encounter: Secondary | ICD-10-CM

## 2012-08-09 DIAGNOSIS — S42102A Fracture of unspecified part of scapula, left shoulder, initial encounter for closed fracture: Secondary | ICD-10-CM

## 2012-08-09 DIAGNOSIS — Z598 Other problems related to housing and economic circumstances: Secondary | ICD-10-CM

## 2012-08-09 MED ORDER — OXYCODONE HCL 5 MG PO TABS: 5.0000 mg | ORAL_TABLET | ORAL | Status: DC | PRN

## 2012-08-09 MED ORDER — PROMETHAZINE HCL 25 MG PO TABS: 25.0000 mg | ORAL_TABLET | Freq: Four times a day (QID) | ORAL | Status: DC | PRN

## 2012-08-09 NOTE — Assessment & Plan Note (Signed)
Patient talked with our social worker this morning. He has all the information about community housing to contact.

## 2012-08-09 NOTE — Progress Notes (Signed)
  Subjective:    Patient ID: [name], male    DOB: [birth year], 56 y.o.   MRN: [id number]  HPI patient is a pleasant 56 year old man with past history of chronic pancreatitis, multiple rib fractures and left clavicle fracture recently status post surgery and homelessness who comes the clinic for followup visit after recent ED visit yesterday. Patient was seen in ED yesterday for nausea and vomiting with abdominal pain and was sent home with Percocet prescription with 6 pills. Patient was not diagnosed with pancreatitis this time and was also given Zofran. Patient says he cannot tolerate Tylenol due to his liver abnormalities and wants to take oxycodone instead of Percocet. Also Zofran helped him this morning and he did not have any nausea vomiting after taking it. He feels much better today and is going to try to eat something. He is accompanied by his ex-wife and they talked with [name] our social worker about getting accommodation as he is living at a community placed which will expire tomorrow. He has the information and place is to contact and he will do that.  He denies any fever, chills, headache, palpitations, chest pain, short of breath.  Review of Systems    as per history of present illness, all other systems reviewed and negative. Objective:   Physical Exam  General: NAD HEENT: PERRL, EOMI, no scleral icterus Cardiac: S1, S2, RRR, no rubs, murmurs or gallops Pulm: clear to auscultation bilaterally, moving normal volumes of air Abd: soft, nontender, nondistended, BS present Ext: warm and well perfused, no pedal edema Neuro: alert and oriented X3, cranial nerves II-XII grossly intact       Assessment & Plan:
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
## 2012-08-09 NOTE — Assessment & Plan Note (Addendum)
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 He has had a cholecystectomy and he is off alcohol for last 3 years. He was ruled out for acute pancreatitis in ED yesterday. Last CT abdomen pelvis in 2012 shows normal pancreas without calcification. Patient reports having one flare up every year for past 3 years since he had stopped drinking. - Considering recent stress and flareup of his nausea vomiting after his aunt died last week- I. will give him 30 pills of oxycodone 5 mg one pill every 6 hours as needed. I will also give him Phenergan 25 mg 30 tablets which he says works better than Zofran while he was in hospital. - He has an appointment with his orthopedic surgeon on 08/14/2012.  - I do not see any reason for chronic opioids at this point of time and so will not need any pain contract now. - Did discuss the same with him and told him that it would be highly unlikely for him to be on chronic opioids for his pancreas now because he has very few flareups every year. He verbalized understanding.

## 2012-08-09 NOTE — Patient Instructions (Signed)
Please make a followup appointment with Dr. Earlene Plater in one to 2 weeks or after getting orange card. I have given a prescription for oxycodone 5 mg 30 tablets and her again laboratory tablets Followup with orthopedics on the 13th of this month.

## 2012-08-09 NOTE — ED Provider Notes (Signed)
History/physical exam/procedure(s) were performed by non-physician practitioner and as supervising physician I was immediately available for consultation/collaboration. I have reviewed all notes and am in agreement with care and plan.   Hayven Fatima S Shefali Ng, MD 08/09/12 1110 

## 2012-08-09 NOTE — Progress Notes (Signed)
[name] presents with his ex-wife as a walk-in to see CSW.  Pt initially wanted to complete application for [employer] but Artist was not available today.  [name] is currently living at [facility] [employer] shelter with one night left.  Pt will need to find alternate housing for Saturday.  Pt ex-wife has been assisting pt with completing necessary documentation and paying for prescription medications.  CSW placed call to [employer], currently full but encouraged pt to complete application during intake day/time.  CSW placed call to [facility] in [place], left message regarding availability.  [name] is a parolee and needs to maintain an address.  His parole officer initially took pt to [employer].   Pt states his physician does not want him to work and he needs to apply for disability.  CSW provided [name] with information on applying for disability.  Provided pt with information on MAP and encouraged pt/ex-wife to schedule appt with financial counselor.  Encouraged pt to go to [employer] as they may have additional resources as the specifically work with the homeless population.   Ex-wife states ER d/c pt because he did not fill his Rx from [facility] appt: Vicodin and Zofran.  CSW provided ex-wife with goodrx.com coupon to obtain Rx for at total of $12 for both at [facility].  Pt continues to complain of pain and took a percocet while meeting with CSW.  Pt asking to see a physician.  CSW took pt and ex-wife to see triage RN for possible appt and questions regarding pancreatitis and acetomenophen intake with pain medications.  Unfortunately, with lack of time and limited resources community services are limited.

## 2012-08-15 ENCOUNTER — Encounter: Payer: Self-pay | Admitting: Internal Medicine

## 2012-08-15 ENCOUNTER — Ambulatory Visit (INDEPENDENT_AMBULATORY_CARE_PROVIDER_SITE_OTHER): Payer: Self-pay | Admitting: Internal Medicine

## 2012-08-15 VITALS — BP 131/82 | HR 97 | Temp 97.0°F

## 2012-08-15 DIAGNOSIS — F419 Anxiety disorder, unspecified: Secondary | ICD-10-CM

## 2012-08-15 DIAGNOSIS — B1920 Unspecified viral hepatitis C without hepatic coma: Secondary | ICD-10-CM

## 2012-08-15 DIAGNOSIS — S42009A Fracture of unspecified part of unspecified clavicle, initial encounter for closed fracture: Secondary | ICD-10-CM

## 2012-08-15 DIAGNOSIS — K861 Other chronic pancreatitis: Secondary | ICD-10-CM

## 2012-08-15 DIAGNOSIS — X58XXXA Exposure to other specified factors, initial encounter: Secondary | ICD-10-CM

## 2012-08-15 DIAGNOSIS — S42002A Fracture of unspecified part of left clavicle, initial encounter for closed fracture: Secondary | ICD-10-CM

## 2012-08-15 DIAGNOSIS — E0590 Thyrotoxicosis, unspecified without thyrotoxic crisis or storm: Secondary | ICD-10-CM

## 2012-08-15 DIAGNOSIS — R109 Unspecified abdominal pain: Secondary | ICD-10-CM

## 2012-08-15 DIAGNOSIS — G8929 Other chronic pain: Secondary | ICD-10-CM

## 2012-08-15 DIAGNOSIS — Z23 Encounter for immunization: Secondary | ICD-10-CM

## 2012-08-15 LAB — LIPASE: Lipase: 26 U/L (ref 0–75)

## 2012-08-15 MED ORDER — AMITRIPTYLINE HCL 50 MG PO TABS: 50.0000 mg | ORAL_TABLET | Freq: Every day | ORAL | Status: DC

## 2012-08-15 MED ORDER — TRAMADOL HCL 50 MG PO TABS: 50.0000 mg | ORAL_TABLET | Freq: Three times a day (TID) | ORAL | Status: DC | PRN

## 2012-08-15 NOTE — Assessment & Plan Note (Signed)
Followed up with surgery yesterday.  They are happy with how he is healing.  They deferred pain management to [place].  I will prescribe him 1 month of tramadol TID.

## 2012-08-15 NOTE — Patient Instructions (Addendum)
Start taking AMITRIPTYLINE 50 mg at bedtime every evening.  This is for pain and anxiety.  Start taking TRAMADOL 50 mg every eight hours as needed for pain.  Fill your prescriptions as soon as you can.  You can fill them at the [facility] pharmacy. [address] [place], [place] [phone]  Follow up with [place] in 2 weeks.  For housing assistance, contact the [employer]. [facility] [address]. [address], [address] [phone]
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
## 2012-08-15 NOTE — Assessment & Plan Note (Addendum)
No mention of abnormal pancreatic architecture in abdominal computed tomography from September, and a CT from 2012 mentions a normal appearing pancreas.  Lipase has been normal every time it has been checked; I do not see an abnormal lipase in his chart at all.  I question whether he has chronic pancreatitis at all without pancreatic calcifications or an elevated lipase somewhere in his history.  If his lipase is normal today, I will discuss my concerns with the patient and consider removing this diagnosis from his problem list. - lipase today  ADDENDUM: lipase returned normal at 26

## 2012-08-15 NOTE — Assessment & Plan Note (Signed)
His abdominal pain is clearly a chronic problem for him.  With his report of hematemesis, hematochezia, and melena; I am concerned about gastritis, peptic ulceration, and neoplasms of the entire gastrointestinal system.  I will refer him to gastroenterology, as there is little I can do until these are ruled out.  His chronic abdominal pain is not coming from chronic pancreatitis.  He has previously had his gall bladder removed and an ERCP in 1997 was normal.  He certainly has hepatitis as demonstrated by the elevations in AST, ALT, and alkaline phosphatase; this may be secondary from prior alcohol abuse or from hepatitis C, but I do not believe this is the cause of his pain.  I am left with irritable bowel syndrome.  His pain seems to have some relationship to anxiety, which fits with IBS, but there is no history of constipation or diarrhea, which is necessary to make that diagnosis.  For this pain, while we wait on gastroenterology, I will prescribed tramadol (enough for 1 month) and amitriptyline in hopes of targeting anxiety or depressive symptoms as well as chronic pain. - tramadol 50mg  q8hrs PRN #90 - amitriptyline 50mg  qHS

## 2012-08-15 NOTE — Assessment & Plan Note (Addendum)
Hepatitis C antibody was reactive on 06/04/2008.  I do not see where this has ever been confirmed.  At his next visit, we will confirm infection with quantitative measure of hepatitis C RNA.  He received his first doses of HAV and HBV vaccinations today.  He was HBV surface antigen negative in 2009. - HCV RNA quantitative at next visit - second HAV vaccine in 6 months (May 2014) - second HBV vaccine in 1 month and third in 6 months (May 2014)

## 2012-08-15 NOTE — Assessment & Plan Note (Addendum)
Noted during hospitalization in September 2013.  TSH was low at 0.017, free T4 was normal at 0.9, and total T3 was normal at 127.4.  I will repeat TSH and free T4 today.  ADDENDUM: TSH low at < 0.008 and free T4 high at 2.19. Updated diagnosis to hyperthyroidism.  This is likely contributing to anxiety and possibly abdominal pain.  I have asked that he come back in to see me next week to discuss these findings and further workup.

## 2012-08-16 ENCOUNTER — Telehealth: Payer: Self-pay | Admitting: *Deleted

## 2012-08-16 NOTE — Telephone Encounter (Signed)
Message copied by Hassan Buckler on Fri Aug 16, 2012  1:49 PM ------      Message from: Gwynn Burly B      Created: Fri Aug 16, 2012  8:15 AM      Regarding: Schedule appt       Please call [name] and see if he could come in to see me next week sometime to discuss his recent lab results and plan further work-up.  You can certainly give him the results of his recent TSH and free T4.  If he cannot come in next week, have him keep his appointment with me in December.            Dr. Earlene Plater

## 2012-08-16 NOTE — Telephone Encounter (Signed)
Pt was called - no answer; message left to call the clinic. 

## 2012-08-16 NOTE — Telephone Encounter (Signed)
Message copied by Hassan Buckler on Fri Aug 16, 2012 11:35 AM ------      Message from: Gwynn Burly B      Created: Fri Aug 16, 2012  8:15 AM      Regarding: Schedule appt       Please call Mr. Rotan and see if he could come in to see me next week sometime to discuss his recent lab results and plan further work-up.  You can certainly give him the results of his recent TSH and free T4.  If he cannot come in next week, have him keep his appointment with me in December.            Dr. Earlene Plater

## 2012-08-16 NOTE — Telephone Encounter (Signed)
Return call from pt.  Pt states he will not be able to return for f/u appt next week. But will keep December appt. Also TSH/T4 results given to pt per Dr Earlene Plater.

## 2012-08-17 ENCOUNTER — Emergency Department (HOSPITAL_COMMUNITY)
Admission: EM | Admit: 2012-08-17 | Discharge: 2012-08-17 | Disposition: A | Discharge status: Home / Self Care | Payer: No Typology Code available for payment source | Attending: Emergency Medicine | Admitting: Emergency Medicine

## 2012-08-17 ENCOUNTER — Emergency Department (HOSPITAL_COMMUNITY): Payer: No Typology Code available for payment source

## 2012-08-17 ENCOUNTER — Encounter (HOSPITAL_COMMUNITY): Payer: Self-pay

## 2012-08-17 DIAGNOSIS — Z8719 Personal history of other diseases of the digestive system: Secondary | ICD-10-CM | POA: Insufficient documentation

## 2012-08-17 DIAGNOSIS — F1011 Alcohol abuse, in remission: Secondary | ICD-10-CM | POA: Insufficient documentation

## 2012-08-17 DIAGNOSIS — R110 Nausea: Secondary | ICD-10-CM | POA: Insufficient documentation

## 2012-08-17 DIAGNOSIS — K861 Other chronic pancreatitis: Secondary | ICD-10-CM | POA: Insufficient documentation

## 2012-08-17 DIAGNOSIS — R1032 Left lower quadrant pain: Secondary | ICD-10-CM | POA: Insufficient documentation

## 2012-08-17 DIAGNOSIS — Z79899 Other long term (current) drug therapy: Secondary | ICD-10-CM | POA: Insufficient documentation

## 2012-08-17 DIAGNOSIS — F411 Generalized anxiety disorder: Secondary | ICD-10-CM | POA: Insufficient documentation

## 2012-08-17 DIAGNOSIS — Z8739 Personal history of other diseases of the musculoskeletal system and connective tissue: Secondary | ICD-10-CM | POA: Insufficient documentation

## 2012-08-17 DIAGNOSIS — R1031 Right lower quadrant pain: Secondary | ICD-10-CM | POA: Insufficient documentation

## 2012-08-17 DIAGNOSIS — R3989 Other symptoms and signs involving the genitourinary system: Secondary | ICD-10-CM | POA: Insufficient documentation

## 2012-08-17 DIAGNOSIS — B150 Hepatitis A with hepatic coma: Secondary | ICD-10-CM | POA: Insufficient documentation

## 2012-08-17 DIAGNOSIS — R109 Unspecified abdominal pain: Secondary | ICD-10-CM

## 2012-08-17 DIAGNOSIS — F17200 Nicotine dependence, unspecified, uncomplicated: Secondary | ICD-10-CM | POA: Insufficient documentation

## 2012-08-17 DIAGNOSIS — I739 Peripheral vascular disease, unspecified: Secondary | ICD-10-CM | POA: Insufficient documentation

## 2012-08-17 DIAGNOSIS — Z9889 Other specified postprocedural states: Secondary | ICD-10-CM | POA: Insufficient documentation

## 2012-08-17 LAB — CBC WITH DIFFERENTIAL/PLATELET
HCT: 43.5 % (ref 39.0–52.0)
Hemoglobin: 15.1 g/dL (ref 13.0–17.0)
Lymphocytes Relative: 30 % (ref 12–46)
Monocytes Absolute: 1.1 10*3/uL — ABNORMAL HIGH (ref 0.1–1.0)
Monocytes Relative: 13 % — ABNORMAL HIGH (ref 3–12)
Neutro Abs: 5.1 10*3/uL (ref 1.7–7.7)
WBC: 9 10*3/uL (ref 4.0–10.5)

## 2012-08-17 LAB — COMPREHENSIVE METABOLIC PANEL
AST: 66 U/L — ABNORMAL HIGH (ref 0–37)
BUN: 11 mg/dL (ref 6–23)
CO2: 27 mEq/L (ref 19–32)
Chloride: 98 mEq/L (ref 96–112)
Creatinine, Ser: 0.67 mg/dL (ref 0.50–1.35)
GFR calc non Af Amer: >90 mL/min (ref >90)
Total Bilirubin: 0.4 mg/dL (ref 0.3–1.2)

## 2012-08-17 LAB — URINALYSIS, ROUTINE W REFLEX MICROSCOPIC
Bilirubin Urine: NEGATIVE
Glucose, UA: NEGATIVE mg/dL
Ketones, ur: NEGATIVE mg/dL
Leukocytes, UA: NEGATIVE
Protein, ur: NEGATIVE mg/dL

## 2012-08-17 MED ORDER — SODIUM CHLORIDE 0.9 % IV BOLUS (SEPSIS)
1000.0000 mL | Freq: Once | INTRAVENOUS | Status: AC
  Administered 2012-08-17: 1000 mL via INTRAVENOUS

## 2012-08-17 MED ORDER — IOHEXOL 300 MG/ML  SOLN
100.0000 mL | Freq: Once | INTRAMUSCULAR | Status: AC | PRN
  Administered 2012-08-17: 100 mL via INTRAVENOUS

## 2012-08-17 MED ORDER — OXYCODONE-ACETAMINOPHEN 5-325 MG PO TABS: 1.0000 | ORAL_TABLET | Freq: Four times a day (QID) | ORAL | Status: DC | PRN

## 2012-08-17 MED ORDER — PROMETHAZINE HCL 25 MG PO TABS: 25.0000 mg | ORAL_TABLET | Freq: Four times a day (QID) | ORAL | Status: DC | PRN

## 2012-08-17 MED ORDER — ONDANSETRON HCL 4 MG/2ML IJ SOLN
4.0000 mg | Freq: Once | INTRAMUSCULAR | Status: AC
  Administered 2012-08-17: 4 mg via INTRAVENOUS
  Filled 2012-08-17: qty 2

## 2012-08-17 MED ORDER — HYDROMORPHONE HCL PF 1 MG/ML IJ SOLN
1.0000 mg | Freq: Once | INTRAMUSCULAR | Status: AC
  Administered 2012-08-17: 1 mg via INTRAVENOUS
  Filled 2012-08-17: qty 1
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
## 2012-08-17 NOTE — ED Notes (Signed)
Pt reports nausea and ab pain since nov. 1st, has been to er x3 for treatment, has chronic pancreatitis.  Does not have a pmd.

## 2012-08-17 NOTE — ED Notes (Signed)
Pt c/o abdominal pain and NV. Pt states pain has been severe and intermittent x1 week and describes vomiting as "dry heaving" intermittently. Pt has hx of pancreatitis but states "this doesn't feel the same". Pt denies chest pain, SOB.

## 2012-08-18 LAB — AFB CULTURE WITH SMEAR (NOT AT ARMC)

## 2012-08-19 ENCOUNTER — Encounter (HOSPITAL_COMMUNITY): Payer: Self-pay

## 2012-08-19 ENCOUNTER — Emergency Department (HOSPITAL_COMMUNITY)
Admission: EM | Admit: 2012-08-19 | Discharge: 2012-08-19 | Disposition: A | Discharge status: Home / Self Care | Payer: No Typology Code available for payment source | Attending: Emergency Medicine | Admitting: Emergency Medicine

## 2012-08-19 DIAGNOSIS — Z8679 Personal history of other diseases of the circulatory system: Secondary | ICD-10-CM | POA: Insufficient documentation

## 2012-08-19 DIAGNOSIS — F1011 Alcohol abuse, in remission: Secondary | ICD-10-CM | POA: Insufficient documentation

## 2012-08-19 DIAGNOSIS — Z8659 Personal history of other mental and behavioral disorders: Secondary | ICD-10-CM | POA: Insufficient documentation

## 2012-08-19 DIAGNOSIS — R1084 Generalized abdominal pain: Secondary | ICD-10-CM | POA: Insufficient documentation

## 2012-08-19 DIAGNOSIS — Z79899 Other long term (current) drug therapy: Secondary | ICD-10-CM | POA: Insufficient documentation

## 2012-08-19 DIAGNOSIS — Z9889 Other specified postprocedural states: Secondary | ICD-10-CM | POA: Insufficient documentation

## 2012-08-19 DIAGNOSIS — R112 Nausea with vomiting, unspecified: Secondary | ICD-10-CM | POA: Insufficient documentation

## 2012-08-19 DIAGNOSIS — Z8739 Personal history of other diseases of the musculoskeletal system and connective tissue: Secondary | ICD-10-CM | POA: Insufficient documentation

## 2012-08-19 DIAGNOSIS — R509 Fever, unspecified: Secondary | ICD-10-CM | POA: Insufficient documentation

## 2012-08-19 DIAGNOSIS — F17200 Nicotine dependence, unspecified, uncomplicated: Secondary | ICD-10-CM | POA: Insufficient documentation

## 2012-08-19 DIAGNOSIS — R109 Unspecified abdominal pain: Secondary | ICD-10-CM

## 2012-08-19 DIAGNOSIS — Z8719 Personal history of other diseases of the digestive system: Secondary | ICD-10-CM | POA: Insufficient documentation

## 2012-08-19 LAB — CBC WITH DIFFERENTIAL/PLATELET
Basophils Absolute: 0 10*3/uL (ref 0.0–0.1)
Basophils Relative: 0 % (ref 0–1)
Eosinophils Absolute: 0 10*3/uL (ref 0.0–0.7)
Eosinophils Relative: 0 % (ref 0–5)
HCT: 43 % (ref 39.0–52.0)
MCH: 30.7 pg (ref 26.0–34.0)
MCHC: 34.9 g/dL (ref 30.0–36.0)
MCV: 87.9 fL (ref 78.0–100.0)
Monocytes Absolute: 0.9 10*3/uL (ref 0.1–1.0)
Neutro Abs: 3.7 10*3/uL (ref 1.7–7.7)
RDW: 12.7 % (ref 11.5–15.5)

## 2012-08-19 LAB — COMPREHENSIVE METABOLIC PANEL
AST: 60 U/L — ABNORMAL HIGH (ref 0–37)
Albumin: 3.9 g/dL (ref 3.5–5.2)
Calcium: 9.8 mg/dL (ref 8.4–10.5)
Creatinine, Ser: 0.68 mg/dL (ref 0.50–1.35)
Total Protein: 7.3 g/dL (ref 6.0–8.3)

## 2012-08-19 LAB — LIPASE, BLOOD: Lipase: 35 U/L (ref 11–59)

## 2012-08-19 MED ORDER — OXYCODONE-ACETAMINOPHEN 5-325 MG PO TABS: 1.0000 | ORAL_TABLET | Freq: Four times a day (QID) | ORAL | Status: DC | PRN

## 2012-08-19 MED ORDER — HYDROMORPHONE HCL PF 2 MG/ML IJ SOLN
2.0000 mg | Freq: Once | INTRAMUSCULAR | Status: AC
  Administered 2012-08-19: 2 mg via INTRAMUSCULAR
  Filled 2012-08-19: qty 1

## 2012-08-19 MED ORDER — PROMETHAZINE HCL 25 MG/ML IJ SOLN
25.0000 mg | Freq: Once | INTRAMUSCULAR | Status: AC
  Administered 2012-08-19: 25 mg via INTRAMUSCULAR
  Filled 2012-08-19: qty 1

## 2012-08-19 MED ORDER — PROMETHAZINE HCL 25 MG PO TABS: 25.0000 mg | ORAL_TABLET | Freq: Four times a day (QID) | ORAL | Status: DC | PRN

## 2012-08-19 NOTE — ED Notes (Signed)
Pt c/o generalized abd pain with n/v x 1 week.  Denies diarrhea, lbm was yesterday.
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
## 2012-08-23 ENCOUNTER — Encounter: Payer: Self-pay | Admitting: Internal Medicine

## 2012-08-23 ENCOUNTER — Ambulatory Visit (HOSPITAL_COMMUNITY)
Admission: RE | Admit: 2012-08-23 | Discharge: 2012-08-23 | Disposition: A | Discharge status: Home / Self Care | Payer: Self-pay | Source: Ambulatory Visit | Attending: Internal Medicine | Admitting: Internal Medicine

## 2012-08-23 ENCOUNTER — Ambulatory Visit (INDEPENDENT_AMBULATORY_CARE_PROVIDER_SITE_OTHER): Payer: Self-pay | Admitting: Internal Medicine

## 2012-08-23 VITALS — BP 132/80 | HR 106 | Temp 96.4°F | Ht 72.0 in | Wt 157.7 lb

## 2012-08-23 DIAGNOSIS — R55 Syncope and collapse: Secondary | ICD-10-CM

## 2012-08-23 DIAGNOSIS — G8929 Other chronic pain: Secondary | ICD-10-CM

## 2012-08-23 DIAGNOSIS — B1920 Unspecified viral hepatitis C without hepatic coma: Secondary | ICD-10-CM

## 2012-08-23 DIAGNOSIS — E0590 Thyrotoxicosis, unspecified without thyrotoxic crisis or storm: Secondary | ICD-10-CM

## 2012-08-23 DIAGNOSIS — R109 Unspecified abdominal pain: Secondary | ICD-10-CM

## 2012-08-23 HISTORY — DX: Syncope and collapse: R55

## 2012-08-23 MED ORDER — ATENOLOL 25 MG PO TABS: 25.0000 mg | ORAL_TABLET | Freq: Every day | ORAL | Status: DC

## 2012-08-23 MED ORDER — OXYCODONE HCL 5 MG PO TABS: 5.0000 mg | ORAL_TABLET | Freq: Three times a day (TID) | ORAL | Status: DC | PRN

## 2012-08-23 NOTE — Progress Notes (Signed)
Subjective:    Patient ID: [name], male    DOB: 1956-03-12, 56 y.o.   MRN: [id number]  CC: chronic abdominal pain, syncope, anxiety, weight loss  HPI:  This is a 56 year old man with chronic abdominal pain, a positive HCV antibody test in the past, and now hyperthyroidism diagnosed at his last visit with me, following up on these many issues.  Since his last visit with me, his abdominal pain has persisted, leading to two emergency department visits.  He has also been suffering from pain over his left scapula, left trapezius, and left clavicle from where he had surgical repair of a fractured clavicle in early October.  He has also experienced a syncope.  The syncope was several days ago.  It only happened once, though he has experienced a similar episode several weeks ago.  He was sitting down and went to stand up.  When he stood up, he became dizzy and lightheaded, he then started to feel as though he was going to pass out.  Then his vision started to go gray and then he blacked out.  When he came to, people were helping him up.  No seizure like activity, no loss of bladder or bowel control, no epileptiform activity.  Secondary to his abdominal pain and nausea, he has not been able to eat much and keep fluids down.  He has had significant weight loss over the past several months.  The amitriptyline that we started last visit did not help his anxiety or his pain.  The tramadol barely touched the pain.  He continues to have some dysphagia with solids.  He denies nausea and vomiting.  He does report feeling hot and flushed and he reports diaphoresis and tremors.  He denies dyspnea and chest pain.   Review of Systems  Constitutional: Positive for chills, diaphoresis, fatigue and unexpected weight change.  HENT: Positive for trouble swallowing.   Respiratory: Negative for shortness of breath.   Cardiovascular: Negative for chest pain.  Gastrointestinal: Positive for nausea and abdominal pain.  Negative for vomiting, diarrhea and constipation.  Neurological: Positive for dizziness, tremors, syncope and light-headedness. Negative for seizures.  Psychiatric/Behavioral: Negative for dysphoric mood. The patient is nervous/anxious.        Objective:   Physical Exam GENERAL: thin; no acute distress HEAD: atraumatic, normocephalic EYES: pupils equal, round and reactive; sclera anicteric; normal conjunctiva NECK: supple, thyroid normal in size and without palpable nodules LYMPH: no cervical or supraclavicular lymphadenopathy LUNGS: right lower lobe wheezing, clear elsewhere, normal work of breathing HEART: normal rate and regular rhythm; normal S1 and S2 without S3 or S4; no murmurs, rubs, or clicks PULSES: radial 2+ and symmetric ABDOMEN: normal appearance, normal bowel sounds, tenderness with palpation of all quadrants, no peritoneal signs REFLEXES: 0 biceps, 2+ patellar (bilateral) SKIN: warm, dry, intact, normal turgor, no rashes PSYCH: patient is alert and oriented, mood and affect are normal and congruent, thought content is normal without delusions, thought process is linear, speech is normal and non-pressured, behavior is normal, judgement and insight are normal   Filed Vitals:   08/23/12 1343  BP: 132/80  Pulse: 106  Temp: 96.4 F (35.8 C)    Orthostatic vital signs: 08/23/2012   Blood Pressure Heart Rate  Lying 105/73 70  Sitting  112/72 70  Standing  106/70 70    EKG Results:  08/23/2012 Rate:  69 PR:  146 QRS:  82 QTc:  402 EKG: normal EKG, normal sinus rhythm.  Assessment & Plan:

## 2012-08-23 NOTE — Assessment & Plan Note (Signed)
Based on history, this is most certainly orthostatic hypotension.  This syncope occurred following an abrupt change in position (sitting to standing), he had pre-syncope dizziness and lightheadedness, and there was no reported incontinence, epileptiform movement, or postictal confusion.  Cardiogenic and neurogenic etiologies are unlikely.  EKG is reassuring today.  He is not currently orthostatic, so I think he will tolerate the atenolol I am starting for hyperthyroidism.   Hopefully adequate pain control with oxycodone will help his nausea, so he can keep fluid and food down.

## 2012-08-23 NOTE — Patient Instructions (Addendum)
Please keep you appointment with GASTROENTEROLOGY on December 17.  We will schedule you to have a THYROID SCAN soon.  Start taking ATENOLOL 25mg  daily.  STOP taking AMITRIPTYLINE.

## 2012-08-23 NOTE — Assessment & Plan Note (Addendum)
Confirmatory testing today with quantitative HCV.  He will get his next HBV vaccine at his follow up with me on December 20.  His final HBV and HAV vaccines will be due in May 2014.  ADDENDUM: Quant HCV was 1.96 million

## 2012-08-23 NOTE — Assessment & Plan Note (Signed)
TSH < 0.008 and free T4 2.19.  Symptomatic with tremors, heat intolerance, diaphoresis, occasional tachycardia documented, anxiety, fatigue, and weight loss.  Thyroid scan ordered.  This may be contributing to the abdominal pain.  It is certainly the source of his anxiety and is also, at least in part, to blame for his weight loss.  We will start treatment with atenolol 25mg  daily.  Thyroid scan ordered. - atenolol 25mg  daily - nuclear thyroid scan and 24-hour radioiodine uptake scan

## 2012-08-23 NOTE — Assessment & Plan Note (Signed)
I am at a loss as to why this man has abdominal pain.  A number of possible etiologies remain possible, including irritable bowel, inflammatory bowel, peptic ulcer, and gastritis.  His hyperthyroidism may be contributing as well, though he does not report diarrhea or constipation.  Based on prior imaging and historical lipase levels, I do not believe he has chronic pancreatitis.  I have removed this from the active problem list, but I have kept it in his history.  He has an appointment with gastroenterology on December 17th; I stressed the importance of this visit with him.  I look forward to hearing what gastroenterology thinks.  In the meantime, while we address his hyperthyroidism and wait for gastroenterology, I will try to treat his pain with oxycodone.  He has signed a pain contract and I have stressed to him the importance of being up front with me and other providers regarding this pain contract and where he gets controlled substances from.  He agreed. - gastroenterology appointment scheduled - oxycodone 5mg  TID PRN #90 prescribed

## 2012-08-27 LAB — HEPATITIS C RNA QUANTITATIVE
HCV Quantitative Log: 6.29 {Log} — ABNORMAL HIGH (ref <1.18)
HCV Quantitative: 1959925 IU/mL — ABNORMAL HIGH (ref <15)

## 2012-09-06 ENCOUNTER — Encounter: Payer: Self-pay | Admitting: Internal Medicine

## 2012-09-17 ENCOUNTER — Ambulatory Visit: Payer: Self-pay | Admitting: Gastroenterology

## 2012-09-20 ENCOUNTER — Encounter: Payer: Self-pay | Admitting: Gastroenterology

## 2012-09-20 ENCOUNTER — Encounter: Payer: Self-pay | Admitting: Internal Medicine

## 2012-09-20 ENCOUNTER — Ambulatory Visit (INDEPENDENT_AMBULATORY_CARE_PROVIDER_SITE_OTHER): Payer: No Typology Code available for payment source | Admitting: Internal Medicine

## 2012-09-20 VITALS — BP 120/76 | HR 98 | Temp 97.4°F | Resp 20 | Ht 70.0 in | Wt 157.8 lb

## 2012-09-20 DIAGNOSIS — B1920 Unspecified viral hepatitis C without hepatic coma: Secondary | ICD-10-CM

## 2012-09-20 DIAGNOSIS — G8929 Other chronic pain: Secondary | ICD-10-CM

## 2012-09-20 DIAGNOSIS — Z23 Encounter for immunization: Secondary | ICD-10-CM

## 2012-09-20 DIAGNOSIS — R109 Unspecified abdominal pain: Secondary | ICD-10-CM

## 2012-09-20 DIAGNOSIS — E0590 Thyrotoxicosis, unspecified without thyrotoxic crisis or storm: Secondary | ICD-10-CM

## 2012-09-20 MED ORDER — METHIMAZOLE 5 MG PO TABS: 5.0000 mg | ORAL_TABLET | Freq: Three times a day (TID) | ORAL | Status: DC

## 2012-09-20 MED ORDER — OXYCODONE HCL 5 MG PO TABS: 5.0000 mg | ORAL_TABLET | Freq: Three times a day (TID) | ORAL | Status: DC | PRN

## 2012-09-20 MED ORDER — PROPRANOLOL HCL 40 MG PO TABS: 40.0000 mg | ORAL_TABLET | Freq: Two times a day (BID) | ORAL | Status: DC

## 2012-09-20 NOTE — Progress Notes (Signed)
  Subjective:    Patient ID: [name], male    DOB: [birth year], 56 y.o.   MRN: [id number]  HPI:  This is a 56 year old man with chronic abdominal pain, confirmed hepatitis C infection, and hypothyroidism; presenting for follow-up. Hepatitis C serologies were positive several years ago, unbeknownst to the patient. I've now confirmed active hepatitis C infection with a viral load, and I have begun prophylactic vaccinations. He received his first round of hepatitis A and hepatitis B vaccines in early November. During a hospital visit in October, abnormalities and thyroid function testing were discovered and he was presumed to have subclinical hyperthyroidism. Symptomatology at follow-up and repeat free T4 and TSH levels confirmed true, active hyperthyroidism. He is scheduled for thyroid uptake imaging on January 7, and he was started on atenolol.  He continues to suffer from anxiety and fatigue, which are the dominant symptoms associated with his hyperthyroidism.  He previously had no GI symptoms but now reports some mild diarrhea over the past few days.  Interestingly, the abdominal pain that has been plaquing him is temporarily relieved by episodes of diarrhea these past few days.  His shoulder continues to bother him and he may have reinjured it with a fall 2 days ago.  He complains of pain primarily over the posterolateral aspect of his left shoulder.  His fall 2 days ago was like previously described syncope with prodrome of presyncopal lightheadedness.  On review of systems, he endorses occasional chest pain. It is substernal in location a very transient. It has only happened a handful of times over the past month. It is nonexertional and it is sharp in quality.    Review of Systems  Constitutional: Positive for activity change (Decreased), fatigue and unexpected weight change (Weight loss). Negative for fever and chills.  HENT: Positive for trouble swallowing (dysphagia of solids, no  odynophagia).   Eyes: Negative.   Respiratory: Negative.   Cardiovascular: Positive for chest pain.  Gastrointestinal: Positive for abdominal pain and diarrhea. Negative for constipation.       Objective:   Physical Exam: GENERAL: thin; no acute distress HEAD: atraumatic, normocephalic EYES: pupils equal, round and reactive; sclera anicteric; normal conjunctiva NOSE/THROAT: oropharynx clear, moist mucous membranes, pink gums, carious dentition NECK: supple, thyroid gland is perhaps slightly enlarged but without palpable nodules LYMPH: no cervical or supraclavicular lymphadenopathy LUNGS: clear to auscultation bilaterally, normal work of breathing HEART: normal rate and regular rhythm; normal S1 and S2 without S3 or S4; no murmurs, rubs, or clicks; precordium is hyperactive; normal point of maximal impulse PULSES: radial 2+ and symmetric ABDOMEN: soft; diffuse, mild tenderness; normal bowel sounds; no masses or organomegaly REFLEXES: 2+ patellar reflexes bilaterally SKIN: warm, dry, intact, normal turgor, no rashes EXTREMITIES: no peripheral edema, no clubbing, no cyanosis PSYCH: patient is alert and oriented, mood and affect are normal and congruent, thought content is normal without delusions, thought process is linear, speech is normal and non-pressured, behavior is normal, judgement and insight are normal  Filed Vitals:   09/20/12 1316  BP: 120/76  Pulse: 98  Temp: 97.4 F (36.3 C)  Resp: 20    BP Readings from Last 3 Encounters:  09/20/12 120/76  08/23/12 132/80  08/19/12 115/68       Assessment & Plan:

## 2012-09-20 NOTE — Patient Instructions (Signed)
General Instructions:  STOP taking ATENOLOL and START taking PROPRANOLOL  START taking METHIMAZOLE.  Take this three times a day.  DO NOT TAKE AFTER December 30.  Please keep your appointment for your thyroid scan on January 6.    Progress Toward Treatment Goals:  Treatment Goal 09/20/2012  Stop smoking smoking the same amount  Other  unable to assess    Self Care Goals & Plans:  Self Care Goal 09/20/2012  Manage my medications take my medicines as prescribed; bring my medications to every visit; refill my medications on time  Stop smoking go to the [employer] ([email or web address])       Care Management & Community Referrals:  Referral 09/20/2012  Referrals made for care management support none needed

## 2012-09-22 ENCOUNTER — Encounter: Payer: Self-pay | Admitting: Internal Medicine

## 2012-09-22 NOTE — Assessment & Plan Note (Addendum)
Symptoms not affected by atenolol.  Switch to more non-specific beta-blockade with propranolol 40mg  BID. I will also start him on methimazole 5mg  TID.  He has been instructed to stop taking methimazole on December 30 in preparation for his thyroid uptake scan on January 6.  After his thyroid scan, he will need to be seen back in clinic for follow up and for a new methimazole rx.  His anxiety, fatigue, and weight loss can almost certainly be attributed to hyperthyroidism. His abdominal pain may be a manifestation as well, or perhaps just worsened by the hyperthyroid state.

## 2012-09-22 NOTE — Assessment & Plan Note (Signed)
He did not keep his GI appt because the address we gave him was wrong.  We have fixed this now and he has the correct directions and will reschedule his visit with GI.  In the meantime, I have refilled his opiod rx and reiterated the points of his pain contract with him. I reviewed the narcotic rx database and reassured he is keeping his end of the bargain.

## 2012-09-22 NOTE — Assessment & Plan Note (Signed)
Second dose of HBV vaccine given today.  Final dose of HAV and HBV due in May 2014.  Once hyperthyroidism is better addressed, we may try to address this issue more definitively with gastroenterology or with a hepatologist.

## 2012-10-04 ENCOUNTER — Emergency Department (HOSPITAL_COMMUNITY)
Admission: EM | Admit: 2012-10-04 | Discharge: 2012-10-04 | Disposition: A | Discharge status: Home / Self Care | Payer: No Typology Code available for payment source | Attending: Emergency Medicine | Admitting: Emergency Medicine

## 2012-10-04 ENCOUNTER — Encounter (HOSPITAL_COMMUNITY): Payer: Self-pay | Admitting: Emergency Medicine

## 2012-10-04 DIAGNOSIS — M545 Low back pain, unspecified: Secondary | ICD-10-CM | POA: Insufficient documentation

## 2012-10-04 DIAGNOSIS — R109 Unspecified abdominal pain: Secondary | ICD-10-CM

## 2012-10-04 DIAGNOSIS — R1033 Periumbilical pain: Secondary | ICD-10-CM | POA: Insufficient documentation

## 2012-10-04 DIAGNOSIS — F17200 Nicotine dependence, unspecified, uncomplicated: Secondary | ICD-10-CM | POA: Insufficient documentation

## 2012-10-04 DIAGNOSIS — G8929 Other chronic pain: Secondary | ICD-10-CM | POA: Insufficient documentation

## 2012-10-04 DIAGNOSIS — Z8619 Personal history of other infectious and parasitic diseases: Secondary | ICD-10-CM | POA: Insufficient documentation

## 2012-10-04 DIAGNOSIS — Z87898 Personal history of other specified conditions: Secondary | ICD-10-CM | POA: Insufficient documentation

## 2012-10-04 DIAGNOSIS — Z9089 Acquired absence of other organs: Secondary | ICD-10-CM | POA: Insufficient documentation

## 2012-10-04 DIAGNOSIS — I739 Peripheral vascular disease, unspecified: Secondary | ICD-10-CM | POA: Insufficient documentation

## 2012-10-04 DIAGNOSIS — F1410 Cocaine abuse, uncomplicated: Secondary | ICD-10-CM | POA: Insufficient documentation

## 2012-10-04 DIAGNOSIS — Z8679 Personal history of other diseases of the circulatory system: Secondary | ICD-10-CM | POA: Insufficient documentation

## 2012-10-04 DIAGNOSIS — Z8781 Personal history of (healed) traumatic fracture: Secondary | ICD-10-CM | POA: Insufficient documentation

## 2012-10-04 DIAGNOSIS — Z8719 Personal history of other diseases of the digestive system: Secondary | ICD-10-CM | POA: Insufficient documentation

## 2012-10-04 DIAGNOSIS — Z87828 Personal history of other (healed) physical injury and trauma: Secondary | ICD-10-CM | POA: Insufficient documentation

## 2012-10-04 DIAGNOSIS — F1010 Alcohol abuse, uncomplicated: Secondary | ICD-10-CM | POA: Insufficient documentation

## 2012-10-04 LAB — URINALYSIS, ROUTINE W REFLEX MICROSCOPIC
Bilirubin Urine: NEGATIVE
Glucose, UA: NEGATIVE mg/dL
Hgb urine dipstick: NEGATIVE
Ketones, ur: NEGATIVE mg/dL
Leukocytes, UA: NEGATIVE
Nitrite: NEGATIVE
Protein, ur: NEGATIVE mg/dL
Specific Gravity, Urine: 1.017 (ref 1.005–1.030)
Urobilinogen, UA: 1 mg/dL (ref 0.0–1.0)
pH: 7.5 (ref 5.0–8.0)

## 2012-10-04 LAB — COMPREHENSIVE METABOLIC PANEL
ALT: 39 U/L (ref 0–53)
AST: 25 U/L (ref 0–37)
Albumin: 3.6 g/dL (ref 3.5–5.2)
Alkaline Phosphatase: 90 U/L (ref 39–117)
BUN: 6 mg/dL (ref 6–23)
CO2: 25 mEq/L (ref 19–32)
Calcium: 9.8 mg/dL (ref 8.4–10.5)
Chloride: 99 mEq/L (ref 96–112)
Creatinine, Ser: 0.6 mg/dL (ref 0.50–1.35)
GFR calc Af Amer: >90 mL/min (ref >90)
GFR calc non Af Amer: >90 mL/min (ref >90)
Glucose, Bld: 135 mg/dL — ABNORMAL HIGH (ref 70–99)
Potassium: 3.9 mEq/L (ref 3.5–5.1)
Sodium: 134 mEq/L — ABNORMAL LOW (ref 135–145)
Total Bilirubin: 0.4 mg/dL (ref 0.3–1.2)
Total Protein: 7.4 g/dL (ref 6.0–8.3)

## 2012-10-04 LAB — CBC
HCT: 42.8 % (ref 39.0–52.0)
Hemoglobin: 14.9 g/dL (ref 13.0–17.0)
MCH: 30.5 pg (ref 26.0–34.0)
MCHC: 34.8 g/dL (ref 30.0–36.0)
MCV: 87.7 fL (ref 78.0–100.0)
Platelets: 362 10*3/uL (ref 150–400)
RBC: 4.88 MIL/uL (ref 4.22–5.81)
RDW: 13.4 % (ref 11.5–15.5)
WBC: 10.3 10*3/uL (ref 4.0–10.5)

## 2012-10-04 LAB — LIPASE, BLOOD: Lipase: 24 U/L (ref 11–59)

## 2012-10-04 MED ORDER — TRAMADOL HCL 50 MG PO TABS: 50.0000 mg | ORAL_TABLET | Freq: Four times a day (QID) | ORAL | Status: DC | PRN

## 2012-10-04 MED ORDER — HYDROMORPHONE HCL PF 1 MG/ML IJ SOLN
1.0000 mg | Freq: Once | INTRAMUSCULAR | Status: AC
  Administered 2012-10-04: 1 mg via INTRAVENOUS
  Filled 2012-10-04: qty 1

## 2012-10-04 MED ORDER — ONDANSETRON HCL 4 MG/2ML IJ SOLN
4.0000 mg | Freq: Once | INTRAMUSCULAR | Status: AC
  Administered 2012-10-04: 4 mg via INTRAVENOUS
  Filled 2012-10-04: qty 2

## 2012-10-04 MED ORDER — SODIUM CHLORIDE 0.9 % IV BOLUS (SEPSIS)
1000.0000 mL | Freq: Once | INTRAVENOUS | Status: AC
  Administered 2012-10-04: 1000 mL via INTRAVENOUS

## 2012-10-04 MED ORDER — ONDANSETRON 4 MG PO TBDP: 4.0000 mg | ORAL_TABLET | Freq: Three times a day (TID) | ORAL | Status: DC | PRN

## 2012-10-04 NOTE — ED Notes (Signed)
Per EMS pt has had lower ab pain x1 month. No change over the last 2 days. Pain is on and off. Pt became nauseated upon arrival but no vomiting noted. Being seen by PCP for Hyperthyroidism and ongoing abdominal pain.
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
## 2012-10-04 NOTE — ED Notes (Signed)
[id number]<AV> Expected date:10/04/12<BR> Expected time: 8:19 AM<BR> Means of arrival:Ambulance<BR> Comments:<BR> 50yoM/lower abd pain

## 2012-10-04 NOTE — ED Notes (Signed)
Pt calling ride to come pick up and ready to d/c.

## 2012-10-04 NOTE — ED Notes (Signed)
Pt refusing to leave and wanting more pain meds. Pt given 2 prescriptions for pain and nausea.

## 2012-10-07 ENCOUNTER — Encounter (HOSPITAL_COMMUNITY): Payer: No Typology Code available for payment source

## 2012-10-08 ENCOUNTER — Encounter (HOSPITAL_COMMUNITY): Payer: No Typology Code available for payment source

## 2012-10-11 ENCOUNTER — Encounter: Payer: Self-pay | Admitting: Internal Medicine

## 2012-10-11 ENCOUNTER — Ambulatory Visit (INDEPENDENT_AMBULATORY_CARE_PROVIDER_SITE_OTHER): Payer: No Typology Code available for payment source | Admitting: Internal Medicine

## 2012-10-11 VITALS — BP 117/73 | HR 78 | Temp 97.0°F | Ht 70.0 in | Wt 154.7 lb

## 2012-10-11 DIAGNOSIS — E0590 Thyrotoxicosis, unspecified without thyrotoxic crisis or storm: Secondary | ICD-10-CM

## 2012-10-11 DIAGNOSIS — X58XXXA Exposure to other specified factors, initial encounter: Secondary | ICD-10-CM

## 2012-10-11 DIAGNOSIS — S42002A Fracture of unspecified part of left clavicle, initial encounter for closed fracture: Secondary | ICD-10-CM

## 2012-10-11 DIAGNOSIS — R109 Unspecified abdominal pain: Secondary | ICD-10-CM

## 2012-10-11 DIAGNOSIS — Z0000 Encounter for general adult medical examination without abnormal findings: Secondary | ICD-10-CM | POA: Insufficient documentation

## 2012-10-11 DIAGNOSIS — G8929 Other chronic pain: Secondary | ICD-10-CM

## 2012-10-11 DIAGNOSIS — S42009A Fracture of unspecified part of unspecified clavicle, initial encounter for closed fracture: Secondary | ICD-10-CM

## 2012-10-11 MED ORDER — OXYCODONE HCL 5 MG PO TABS: 5.0000 mg | ORAL_TABLET | Freq: Three times a day (TID) | ORAL | Status: DC | PRN
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
## 2012-10-11 NOTE — Patient Instructions (Addendum)
1. Please re-start taking your methimazole from now, but stop taking it at 10/21/12 in order to prepare for your thyroid gland scanning test.   2. Please take all medications as prescribed.  3. If you have worsening of your symptoms or new symptoms arise, please call the clinic ([phone]), or go to the ER immediately if symptoms are severe.

## 2012-10-11 NOTE — Assessment & Plan Note (Signed)
Patient still has some symptoms including sweating a lot, hot intolerance, hand shaking. Her recent TSH was less than 0.008 and free T4 is elevated at 2.19 on 08/15/12.  Patient was scheduled for a thyroid scan test on October 07, 2012, but due to misunderstanding, patient did not keep his appointment. will reschedule patient for the thyroid gland scan test on October 29, 2012. Patient is instructed to start his methimazole from now, but stop taking it on 10/21/12 in ordered to prepare for thyroid gland scan test. After he completed thyroid gland scan test, he may need to restart his methimazole.

## 2012-10-11 NOTE — Assessment & Plan Note (Signed)
-  Patient refused flu shot. Will postpone.
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
## 2012-10-11 NOTE — Assessment & Plan Note (Signed)
Patient has chronic abdominal pain. He has extensive workup in the past,  including 5 times of negative lipase tests and abdominal CT scan. Etiology is not clear currently. He has an appointment with a GI on 10/29/11. Will continue Oxycodon to bridge him to see GI.

## 2012-10-28 ENCOUNTER — Encounter: Payer: Self-pay | Admitting: Gastroenterology

## 2012-10-28 ENCOUNTER — Ambulatory Visit (INDEPENDENT_AMBULATORY_CARE_PROVIDER_SITE_OTHER): Payer: No Typology Code available for payment source | Admitting: Gastroenterology

## 2012-10-28 ENCOUNTER — Other Ambulatory Visit (INDEPENDENT_AMBULATORY_CARE_PROVIDER_SITE_OTHER): Payer: No Typology Code available for payment source

## 2012-10-28 VITALS — BP 120/60 | HR 66 | Ht 70.0 in | Wt 155.0 lb

## 2012-10-28 DIAGNOSIS — K861 Other chronic pancreatitis: Secondary | ICD-10-CM

## 2012-10-28 DIAGNOSIS — R1310 Dysphagia, unspecified: Secondary | ICD-10-CM

## 2012-10-28 DIAGNOSIS — R109 Unspecified abdominal pain: Secondary | ICD-10-CM

## 2012-10-28 DIAGNOSIS — B182 Chronic viral hepatitis C: Secondary | ICD-10-CM

## 2012-10-28 LAB — CBC WITH DIFFERENTIAL/PLATELET
Basophils Relative: 0.5 % (ref 0.0–3.0)
Eosinophils Absolute: 0.1 10*3/uL (ref 0.0–0.7)
Eosinophils Relative: 1.2 % (ref 0.0–5.0)
Lymphocytes Relative: 53.6 % — ABNORMAL HIGH (ref 12.0–46.0)
Neutrophils Relative %: 33 % — ABNORMAL LOW (ref 43.0–77.0)
Platelets: 330 10*3/uL (ref 150.0–400.0)
RBC: 5.06 Mil/uL (ref 4.22–5.81)
WBC: 11.6 10*3/uL — ABNORMAL HIGH (ref 4.5–10.5)

## 2012-10-28 LAB — COMPREHENSIVE METABOLIC PANEL
AST: 79 U/L — ABNORMAL HIGH (ref 0–37)
Albumin: 3.7 g/dL (ref 3.5–5.2)
BUN: 11 mg/dL (ref 6–23)
Calcium: 9.5 mg/dL (ref 8.4–10.5)
Chloride: 105 mEq/L (ref 96–112)
Glucose, Bld: 60 mg/dL — ABNORMAL LOW (ref 70–99)
Potassium: 4.1 mEq/L (ref 3.5–5.1)
Sodium: 139 mEq/L (ref 135–145)
Total Protein: 7.6 g/dL (ref 6.0–8.3)

## 2012-10-28 LAB — PROTIME-INR: Prothrombin Time: 9.2 s — ABNORMAL LOW (ref 10.2–12.4)
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
## 2012-10-28 NOTE — Patient Instructions (Addendum)
One of your biggest health concerns is your smoking.  This increases your risk for most cancers and serious cardiovascular diseases such as strokes, heart attacks.  You should try your best to stop.  If you need assistance, please contact your PCP or Smoking Cessation Class at [facility] [phone]) or [facility] Quit-Line (1-800-QUIT-NOW). You will have labs checked today in the basement lab.  Please head down after you check out with the front desk  (hepatitis C genotype, cbc, cmet, AFP, INR).   MRI abd with MRCP images for hepatoma screening, check for chronic pancreatitis, also check for bile duct stones : intermittent abdominal pains.  You have been scheduled for an MRI at [facility]  on 11/01/12. Your appointment time is 9 am. Please arrive 15 minutes prior to your appointment time for registration purposes. Nothing to eat or drink 4 hours prior to the test. However, if you have any metal in your body, have a pacemaker or defibrillator, please be sure to let your ordering physician know. This test typically takes 45 minutes to 1 hour to complete. We will get previous records from Dr. Bosie Clos (colonoscopy and EGD results). You will be set up for an upper endoscopy for dysphagia (at [facility] with [facility]).

## 2012-10-29 ENCOUNTER — Encounter (HOSPITAL_COMMUNITY)
Admission: RE | Admit: 2012-10-29 | Discharge: 2012-10-29 | Disposition: A | Discharge status: Home / Self Care | Payer: No Typology Code available for payment source | Source: Ambulatory Visit | Attending: Internal Medicine | Admitting: Internal Medicine

## 2012-10-29 DIAGNOSIS — E0590 Thyrotoxicosis, unspecified without thyrotoxic crisis or storm: Secondary | ICD-10-CM | POA: Insufficient documentation

## 2012-10-30 ENCOUNTER — Encounter (HOSPITAL_COMMUNITY)
Admission: RE | Admit: 2012-10-30 | Discharge: 2012-10-30 | Disposition: A | Discharge status: Home / Self Care | Payer: No Typology Code available for payment source | Source: Ambulatory Visit | Attending: Internal Medicine | Admitting: Internal Medicine

## 2012-10-30 MED ORDER — SODIUM IODIDE I 131 CAPSULE
15.3000 | Freq: Once | INTRAVENOUS | Status: AC | PRN
  Administered 2012-10-30: 15.3 via ORAL

## 2012-10-30 MED ORDER — SODIUM PERTECHNETATE TC 99M INJECTION
10.1000 | Freq: Once | INTRAVENOUS | Status: AC | PRN
  Administered 2012-10-30: 10.1 via INTRAVENOUS

## 2012-10-31 ENCOUNTER — Other Ambulatory Visit: Payer: Self-pay | Admitting: *Deleted

## 2012-10-31 NOTE — Telephone Encounter (Signed)
Pt needs new Rx for this  He has an appointment on 11/06/12 with Dr Loistine Chance ( if you want to contact her)

## 2012-11-01 ENCOUNTER — Ambulatory Visit (HOSPITAL_COMMUNITY)
Admission: RE | Admit: 2012-11-01 | Discharge: 2012-11-01 | Disposition: A | Discharge status: Home / Self Care | Payer: No Typology Code available for payment source | Source: Ambulatory Visit | Attending: Gastroenterology | Admitting: Gastroenterology

## 2012-11-01 ENCOUNTER — Other Ambulatory Visit: Payer: Self-pay | Admitting: Gastroenterology

## 2012-11-01 DIAGNOSIS — R109 Unspecified abdominal pain: Secondary | ICD-10-CM

## 2012-11-01 DIAGNOSIS — B182 Chronic viral hepatitis C: Secondary | ICD-10-CM

## 2012-11-01 DIAGNOSIS — Z9089 Acquired absence of other organs: Secondary | ICD-10-CM | POA: Insufficient documentation

## 2012-11-01 DIAGNOSIS — K861 Other chronic pancreatitis: Secondary | ICD-10-CM | POA: Insufficient documentation

## 2012-11-01 DIAGNOSIS — R1310 Dysphagia, unspecified: Secondary | ICD-10-CM

## 2012-11-01 DIAGNOSIS — N281 Cyst of kidney, acquired: Secondary | ICD-10-CM | POA: Insufficient documentation

## 2012-11-01 DIAGNOSIS — D1803 Hemangioma of intra-abdominal structures: Secondary | ICD-10-CM | POA: Insufficient documentation

## 2012-11-01 MED ORDER — GADOBENATE DIMEGLUMINE 529 MG/ML IV SOLN
15.0000 mL | Freq: Once | INTRAVENOUS | Status: AC | PRN
  Administered 2012-11-01: 15 mL via INTRAVENOUS

## 2012-11-04 ENCOUNTER — Other Ambulatory Visit: Payer: Self-pay

## 2012-11-04 DIAGNOSIS — B182 Chronic viral hepatitis C: Secondary | ICD-10-CM

## 2012-11-06 ENCOUNTER — Ambulatory Visit (INDEPENDENT_AMBULATORY_CARE_PROVIDER_SITE_OTHER): Payer: No Typology Code available for payment source | Admitting: Internal Medicine

## 2012-11-06 ENCOUNTER — Encounter: Payer: Self-pay | Admitting: Internal Medicine

## 2012-11-06 VITALS — BP 125/77 | HR 92 | Temp 96.7°F | Resp 20 | Ht 70.5 in | Wt 151.5 lb

## 2012-11-06 DIAGNOSIS — S42009A Fracture of unspecified part of unspecified clavicle, initial encounter for closed fracture: Secondary | ICD-10-CM

## 2012-11-06 DIAGNOSIS — X58XXXA Exposure to other specified factors, initial encounter: Secondary | ICD-10-CM

## 2012-11-06 DIAGNOSIS — G8929 Other chronic pain: Secondary | ICD-10-CM

## 2012-11-06 DIAGNOSIS — F17200 Nicotine dependence, unspecified, uncomplicated: Secondary | ICD-10-CM

## 2012-11-06 DIAGNOSIS — E0590 Thyrotoxicosis, unspecified without thyrotoxic crisis or storm: Secondary | ICD-10-CM

## 2012-11-06 DIAGNOSIS — S42002A Fracture of unspecified part of left clavicle, initial encounter for closed fracture: Secondary | ICD-10-CM

## 2012-11-06 MED ORDER — METHIMAZOLE 10 MG PO TABS: 10.0000 mg | ORAL_TABLET | Freq: Every day | ORAL | Status: DC

## 2012-11-06 MED ORDER — PROMETHAZINE HCL 25 MG PO TABS: 25.0000 mg | ORAL_TABLET | Freq: Four times a day (QID) | ORAL | Status: DC | PRN

## 2012-11-06 MED ORDER — PROPRANOLOL HCL 40 MG PO TABS: 40.0000 mg | ORAL_TABLET | Freq: Every day | ORAL | Status: DC

## 2012-11-06 MED ORDER — OXYCODONE HCL 5 MG PO TABS: 5.0000 mg | ORAL_TABLET | Freq: Three times a day (TID) | ORAL | Status: DC | PRN
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
## 2012-11-06 NOTE — Assessment & Plan Note (Signed)
Continues to smoke. Consult on smoking cessation

## 2012-11-06 NOTE — Assessment & Plan Note (Signed)
Patient's symptoms are consistent with hyperparathyroidism. NM Thyroid scan is  suggestive of Graves' disease. I'll obtain thyroid receptor antibody level. I will restart methimazole to 10 mg daily which is recommended dosage for Graves' disease. I'll continue propranolol 40 mg daily constipation we be evaluated by PCP. At that time consider to discontinue propanolol. Up to date recommends if patient is euthyroid to consider radio iodine ablation.  Consider to refer patient to endocrinology for further evaluation and management
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
## 2012-11-13 LAB — THYROTROPIN RECEPTOR AUTOABS: Thyrotropin Receptor Ab: 10.8 % (ref <=16.0)

## 2012-11-15 ENCOUNTER — Encounter: Payer: No Typology Code available for payment source | Admitting: Gastroenterology

## 2012-11-15 ENCOUNTER — Encounter: Payer: Self-pay | Admitting: Internal Medicine

## 2012-11-15 ENCOUNTER — Encounter: Payer: No Typology Code available for payment source | Admitting: Internal Medicine

## 2012-12-02 ENCOUNTER — Ambulatory Visit: Payer: No Typology Code available for payment source | Admitting: Internal Medicine

## 2012-12-02 ENCOUNTER — Encounter: Payer: Self-pay | Admitting: Internal Medicine

## 2012-12-02 ENCOUNTER — Ambulatory Visit (INDEPENDENT_AMBULATORY_CARE_PROVIDER_SITE_OTHER): Payer: No Typology Code available for payment source | Admitting: Internal Medicine

## 2012-12-02 VITALS — BP 132/74 | HR 80 | Temp 97.0°F | Ht 71.0 in | Wt 154.0 lb

## 2012-12-02 DIAGNOSIS — H9313 Tinnitus, bilateral: Secondary | ICD-10-CM

## 2012-12-02 DIAGNOSIS — S42002S Fracture of unspecified part of left clavicle, sequela: Secondary | ICD-10-CM

## 2012-12-02 DIAGNOSIS — R61 Generalized hyperhidrosis: Secondary | ICD-10-CM

## 2012-12-02 DIAGNOSIS — Z0289 Encounter for other administrative examinations: Secondary | ICD-10-CM

## 2012-12-02 DIAGNOSIS — F17200 Nicotine dependence, unspecified, uncomplicated: Secondary | ICD-10-CM

## 2012-12-02 DIAGNOSIS — G8929 Other chronic pain: Secondary | ICD-10-CM

## 2012-12-02 DIAGNOSIS — F411 Generalized anxiety disorder: Secondary | ICD-10-CM

## 2012-12-02 DIAGNOSIS — E0590 Thyrotoxicosis, unspecified without thyrotoxic crisis or storm: Secondary | ICD-10-CM

## 2012-12-02 DIAGNOSIS — R1310 Dysphagia, unspecified: Secondary | ICD-10-CM

## 2012-12-02 HISTORY — DX: Generalized anxiety disorder: F41.1

## 2012-12-02 LAB — T4, FREE: Free T4: 1.17 ng/dL (ref 0.80–1.80)

## 2012-12-02 MED ORDER — CITALOPRAM HYDROBROMIDE 20 MG PO TABS: 20.0000 mg | ORAL_TABLET | Freq: Every day | ORAL | Status: DC

## 2012-12-02 MED ORDER — OXYCODONE HCL 5 MG PO TABS: 5.0000 mg | ORAL_TABLET | Freq: Three times a day (TID) | ORAL | Status: DC | PRN

## 2012-12-02 NOTE — Assessment & Plan Note (Signed)
With complaint of rhinorrhea and exam finding of right TM retraction, the most likely etiology is a recent viral URI and resultant labyrinthitis. No referral or imaging indicated at this time. If this fails to resolve, we will consider referral to neurology or brain imaging.

## 2012-12-02 NOTE — Assessment & Plan Note (Signed)
Seen by gastroenterology for this. Abdominal MRI and MRCP completed. Patient did not complain of abdominal pain at this visit.  IMPRESSION:  1. Normal pancreaticobiliary tree.  2. Benign appearing hemangioma on the anterior surface of the left  hepatic lobe. No worrisome hepatic lesions or biliary dilatation.  3. 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  4. Small bilateral renal cysts.

## 2012-12-02 NOTE — Assessment & Plan Note (Signed)
Reports a history of an anxiety disorder. Says he was on Xanax in the past. Some anxiety can be attributed to his hyperthyroidism. We will start citalopram at this visit. - Start citalopram 20 mg daily

## 2012-12-02 NOTE — Assessment & Plan Note (Addendum)
Scheduled for screening colonoscopy in 2 days with gastroenterology. Recent abdominal MRI was unremarkable. No indication for chest imaging at this time. We will screen for tuberculosis given his homelessness. - TB quantiferon assay  ADDENDUM: TB quantiferon negative

## 2012-12-02 NOTE — Assessment & Plan Note (Signed)
Scheduled for an EGD and colonoscopy in 2 days with gastroenterology. We will follow their lead on this issue.

## 2012-12-02 NOTE — Assessment & Plan Note (Signed)
Urine drug screen obtained during visit. Refilled oxycodone. Referred to physical therapy. Consider repeat plain radiography at next visit to rule out malunion.

## 2012-12-02 NOTE — Assessment & Plan Note (Signed)
Assessment:  Progress toward smoking cessation:  smoking the same amount  Barriers to progress toward smoking cessation:  withdrawal symptoms  Plan:  Educational resources provided:  [employer] (1-800-QUIT-NOW) brochure  Self management tools provided:  smoking cessation plan Environmental education officer Quit Plan)

## 2012-12-02 NOTE — Assessment & Plan Note (Addendum)
Symptoms improving. Repeat TSH and free T4 today. Continue methimazole 10 mg a day for now. - Continue methimazole 10 mg daily - TSH today - Free T4 today  ADDENDUM: TSH has increased but is still low at 0.012. Free T4 has normalized with a value of 1.17. In Graves' hyperthyroidism, TSH remains suppressed even after a euthyroid state has been achieved. I think this pattern of TSH and free T4 demonstrates successful treatment with methimazole. We will continue this treatment with 10 mg a day and refer him to endocrinology at his next visit.
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
## 2012-12-02 NOTE — Patient Instructions (Signed)
General Instructions: 1) Stop taking propranolol.  2) Start taking citalopram 20mg  every day. 3) Continue taking methimazole 10mg  (2 tablets) every day. 4) Start seeing physical therapy.   Treatment Goals:  Goals (1 Years of Data) as of 12/02/12         As of Today 11/06/12 10/28/12 10/11/12 10/04/12     Blood Pressure    . Blood Pressure < 140/90  132/74 125/77 120/60 117/73 160/88     Lifestyle    . Quit smoking / using tobacco   No        Result Component    . LDL CALC < 190            Progress Toward Treatment Goals:  Treatment Goal 12/02/2012  Stop smoking smoking the same amount  Other  unchanged    Self Care Goals & Plans:  Self Care Goal 12/02/2012  Manage my medications take my medicines as prescribed; bring my medications to every visit; refill my medications on time  Eat healthy foods eat foods that are low in salt  Stop smoking -

## 2012-12-03 LAB — PRESCRIPTION ABUSE MONITORING 15P, URINE
Amphetamine/Meth: NEGATIVE ng/mL
Barbiturate Screen, Urine: NEGATIVE ng/mL
Carisoprodol, Urine: NEGATIVE ng/mL
Creatinine, Urine: 196.06 mg/dL (ref >20.0)
Fentanyl, Ur: NEGATIVE ng/mL
Meperidine, Ur: NEGATIVE ng/mL
Tramadol Scrn, Ur: NEGATIVE ng/mL
Zolpidem, Urine: NEGATIVE ng/mL

## 2012-12-04 ENCOUNTER — Encounter: Payer: Self-pay | Admitting: Internal Medicine

## 2012-12-04 ENCOUNTER — Encounter: Payer: No Typology Code available for payment source | Admitting: Gastroenterology

## 2012-12-04 ENCOUNTER — Telehealth: Payer: Self-pay | Admitting: Gastroenterology

## 2012-12-04 LAB — BENZODIAZEPINES (GC/LC/MS), URINE
Alprazolam (GC/LC/MS), ur confirm: 194 ng/mL
Alprazolam metabolite (GC/LC/MS), ur confirm: 364 ng/mL
Estazolam (GC/LC/MS), ur confirm: NEGATIVE ng/mL
Flurazepam metabolite (GC/LC/MS), ur confirm: NEGATIVE ng/mL
Midazolam (GC/LC/MS), ur confirm: NEGATIVE ng/mL
Nordiazepam (GC/LC/MS), ur confirm: NEGATIVE ng/mL

## 2012-12-04 LAB — OPIATES/OPIOIDS (LC/MS-MS)
Codeine Urine: NEGATIVE ng/mL
Hydromorphone: 245 ng/mL
Morphine Urine: 4753 ng/mL
Noroxycodone, Ur: 3606 ng/mL
Oxymorphone: 4991 ng/mL

## 2012-12-04 LAB — OXYCODONE, URINE (LC/MS-MS): Oxymorphone: 4991 ng/mL

## 2012-12-04 NOTE — Telephone Encounter (Signed)
Yes. He did not show up for appt today and he never called to cancel.  We called his number, then emergency contact only to find out he was not coming.

## 2012-12-05 ENCOUNTER — Telehealth: Payer: Self-pay | Admitting: Internal Medicine

## 2012-12-05 LAB — QUANTIFERON TB GOLD ASSAY (BLOOD)
Interferon Gamma Release Assay: NEGATIVE
Mitogen value: 7.34 IU/mL
Quantiferon Nil Value: 0.04 IU/mL
Quantiferon Tb Ag Minus Nil Value: 0 IU/mL
TB Ag value: 0.04 IU/mL

## 2012-12-05 NOTE — Telephone Encounter (Signed)
I called patient and spoke with him regarding the urine drug screen results from his last visit. He said it was an isolated incident. I told him that I would give him the benefit of doubt this time; but I told him that if it continues, and if his urine is positive for cocaine again, we will stop giving him opioid pain medicine.

## 2012-12-10 ENCOUNTER — Ambulatory Visit: Payer: No Typology Code available for payment source | Attending: Internal Medicine | Admitting: Physical Therapy

## 2012-12-10 DIAGNOSIS — M25619 Stiffness of unspecified shoulder, not elsewhere classified: Secondary | ICD-10-CM | POA: Insufficient documentation

## 2012-12-10 DIAGNOSIS — M25519 Pain in unspecified shoulder: Secondary | ICD-10-CM | POA: Insufficient documentation

## 2012-12-10 DIAGNOSIS — IMO0001 Reserved for inherently not codable concepts without codable children: Secondary | ICD-10-CM | POA: Insufficient documentation

## 2012-12-11 ENCOUNTER — Encounter: Payer: Self-pay | Admitting: Internal Medicine

## 2012-12-13 ENCOUNTER — Telehealth: Payer: Self-pay | Admitting: *Deleted

## 2012-12-13 NOTE — Telephone Encounter (Signed)
Pt calls and c/o starting celexa last week, since then he states his anxiety is worse, can't sleep, can't rest, please call him at [phone] and discuss this asap

## 2012-12-16 ENCOUNTER — Telehealth: Payer: Self-pay | Admitting: Internal Medicine

## 2012-12-16 NOTE — Telephone Encounter (Signed)
Patient reports the citalopram we started at his most recent visit has caused an increase in anxiety. I have asked to stop this medicine. We will reassess at his next visit.  He also complains of nausea. It began yesterday. It has been refractory to promethazine which he has at home. He has not actually vomited anything up. He denies fevers, diarrhea, and worsened abdominal pain. He is experiencing a chronic abdominal pain that is usual for him. I advised him to come to the clinic or seek immediate medical attention if his nausea does not improve in the next day or 2. I advised him to come to the clinic or seek immediate medical care if he begins experiencing fevers, hematemesis, diarrhea, or worsening abdominal pain.

## 2012-12-19 ENCOUNTER — Ambulatory Visit: Payer: No Typology Code available for payment source | Admitting: Physical Therapy

## 2012-12-23 ENCOUNTER — Ambulatory Visit: Payer: No Typology Code available for payment source | Admitting: Physical Therapy

## 2012-12-23 ENCOUNTER — Ambulatory Visit (AMBULATORY_SURGERY_CENTER): Payer: No Typology Code available for payment source | Admitting: *Deleted

## 2012-12-23 VITALS — Ht 71.0 in | Wt 155.0 lb

## 2012-12-23 DIAGNOSIS — R1310 Dysphagia, unspecified: Secondary | ICD-10-CM

## 2012-12-26 ENCOUNTER — Ambulatory Visit: Payer: No Typology Code available for payment source | Admitting: Physical Therapy

## 2012-12-30 ENCOUNTER — Encounter: Payer: Self-pay | Admitting: Internal Medicine

## 2012-12-30 ENCOUNTER — Ambulatory Visit (INDEPENDENT_AMBULATORY_CARE_PROVIDER_SITE_OTHER): Payer: No Typology Code available for payment source | Admitting: Internal Medicine

## 2012-12-30 VITALS — BP 143/86 | HR 80 | Temp 98.5°F | Ht 71.0 in | Wt 154.5 lb

## 2012-12-30 DIAGNOSIS — G8929 Other chronic pain: Secondary | ICD-10-CM

## 2012-12-30 DIAGNOSIS — Z598 Other problems related to housing and economic circumstances: Secondary | ICD-10-CM

## 2012-12-30 DIAGNOSIS — F411 Generalized anxiety disorder: Secondary | ICD-10-CM

## 2012-12-30 DIAGNOSIS — N400 Enlarged prostate without lower urinary tract symptoms: Secondary | ICD-10-CM | POA: Insufficient documentation

## 2012-12-30 DIAGNOSIS — K089 Disorder of teeth and supporting structures, unspecified: Secondary | ICD-10-CM

## 2012-12-30 DIAGNOSIS — R109 Unspecified abdominal pain: Secondary | ICD-10-CM

## 2012-12-30 DIAGNOSIS — E0590 Thyrotoxicosis, unspecified without thyrotoxic crisis or storm: Secondary | ICD-10-CM

## 2012-12-30 DIAGNOSIS — K921 Melena: Secondary | ICD-10-CM | POA: Insufficient documentation

## 2012-12-30 DIAGNOSIS — R110 Nausea: Secondary | ICD-10-CM

## 2012-12-30 DIAGNOSIS — K0889 Other specified disorders of teeth and supporting structures: Secondary | ICD-10-CM

## 2012-12-30 DIAGNOSIS — R1310 Dysphagia, unspecified: Secondary | ICD-10-CM

## 2012-12-30 DIAGNOSIS — S42002S Fracture of unspecified part of left clavicle, sequela: Secondary | ICD-10-CM

## 2012-12-30 DIAGNOSIS — S42309S Unspecified fracture of shaft of humerus, unspecified arm, sequela: Secondary | ICD-10-CM

## 2012-12-30 HISTORY — DX: Benign prostatic hyperplasia without lower urinary tract symptoms: N40.0

## 2012-12-30 MED ORDER — FINASTERIDE 5 MG PO TABS: 5.0000 mg | ORAL_TABLET | Freq: Every day | ORAL | Status: DC

## 2012-12-30 MED ORDER — NAPROXEN 500 MG PO TABS: 500.0000 mg | ORAL_TABLET | Freq: Two times a day (BID) | ORAL | Status: DC

## 2012-12-30 MED ORDER — METHIMAZOLE 10 MG PO TABS: 10.0000 mg | ORAL_TABLET | Freq: Every day | ORAL | Status: DC

## 2012-12-30 MED ORDER — KETOROLAC TROMETHAMINE 60 MG/2ML IM SOLN
60.0000 mg | Freq: Once | INTRAMUSCULAR | Status: DC
  Administered 2012-12-30: 60 mg via INTRAMUSCULAR

## 2012-12-30 MED ORDER — BUSPIRONE HCL 7.5 MG PO TABS: 7.5000 mg | ORAL_TABLET | Freq: Two times a day (BID) | ORAL | Status: DC

## 2012-12-30 MED ORDER — PROMETHAZINE HCL 25 MG PO TABS: 25.0000 mg | ORAL_TABLET | Freq: Four times a day (QID) | ORAL | Status: DC | PRN

## 2012-12-30 NOTE — Patient Instructions (Addendum)
We are attempting to make referrals to the following: 1. Dentistry 2. Endocrinology 3. Orthopedic surgery  Continue taking the following: 1. Methimazole 10 mg daily 2. Promethazine 25 mg every 6 hours as needed for nausea 3. Oxycodone 5 mg every 8 hours as needed for pain  Start taking these new medicines: 1. Buspirone 7.5 mg twice a day for anxiety 2. Naproxen 500 mg twice a day with a meal for pain 3. Finasteride 5 mg daily for your prostate  We will need to begin a transfer off of oxycodone for pain management. We will do this in conjunction with physical therapy and orthopedic surgery.

## 2012-12-30 NOTE — Assessment & Plan Note (Signed)
Symptoms consistent with BPH. Exam reveals diffusely enlarged but smooth prostate. Will start finasteride 5 mg daily. He is unable to afford tamsulosin. Creatinine was normal in January. We will watch this closely going forward. - Start finasteride 5 mg daily - Repeat kidney function testing soon

## 2012-12-30 NOTE — Assessment & Plan Note (Signed)
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 He will undergo EGD on Friday.

## 2012-12-30 NOTE — Assessment & Plan Note (Addendum)
We will add naproxen 500 mg twice a day to supplement the opioid analgesic. Continue physical therapy. Referral made to orthopedic surgery. I agree, I think this plate needs to come out. I again reiterated to the patient his need to abstain from cocaine. If he is found to have used cocaine again, we will terminate opioid prescriptions. I also told him to abstain from marijuana and his sons alprazolam. We are repeating a urine drug panel today. - Oxycodone 5 mg every 8 hours when necessary - Naproxen 500 mg twice a day - Continue physical therapy - Refer to orthopedic surgery

## 2012-12-30 NOTE — Progress Notes (Signed)
Subjective:    Patient ID: [name], male    DOB: [birth year], 57 y.o.   MRN: [id number]  HPI:  57 year old man with numerous medical problems here for followup.  Left Clavicular Fracture and Pain Patient did followup with physical therapy and a treatment regimen has been established. The physical therapist feels the plate installed on the patient's left clavicle is restricting his motion and his most likely contributing to his pain. The patient says that the physical therapist feels this plate should come out. He continues to have severe and debilitating pain from this issue. It impacts his sleep at night. He continues to take oxycodone 5 mg every 8 hours as needed for pain and he has run of this 10 days before his next refill is available.  Hyperthyroidism Patient continues taking methimazole 10 mg daily. He denies diarrhea or constipation. He does report cold intolerance and anxiety. He denies skin or hair changes.  Dysphagia Trouble swallowing solids and pills. Occasional difficulty with fluid. This has actually improved since I last saw him. No odynophagia.  He had been scheduled for an EGD but missed this appointment. He has rescheduled this for Friday of this week.  Blood in Stool Patient reports occasionally seeing blood in his stool. This is not a constant problem that has been an issue for perhaps a year. He has never had a colonoscopy.  Anxiety Reports feeling anxious and on edge almost all the time. Did not tolerate citalopram; this made it worse. He self medicates with his sons alprazolam.  Substance abuse Ashby Dawes reports the cocaine in his urine came from an isolated incident and promises that this will never happen again. He denies methamphetamine and heroine use. He does report frequent marijuana use. He also reports taking his son's alprazolam for his own anxiety.  Tooth Ache Onset less than 7 days ago. No provoking injury. Pain is sharp and shooting. Pain radiates into  the jaw. Pain is severe. Pain is all the time and worse with eating. Oxycodone provide some relief. No associated symptoms such as fevers, chills, or drainage.   Review of Systems  Constitutional: Positive for fatigue. Negative for chills.  HENT: Positive for trouble swallowing and dental problem.   Respiratory: Negative for shortness of breath.   Cardiovascular: Negative for chest pain.  Gastrointestinal: Positive for nausea, abdominal pain and blood in stool. Negative for vomiting, diarrhea and constipation.  Endocrine: Positive for cold intolerance.  Genitourinary: Positive for difficulty urinating.  Musculoskeletal: Positive for arthralgias.  Psychiatric/Behavioral: The patient is nervous/anxious.     Current Medications: 1. Methimazole 10 mg daily 2. Oxycodone 5 mg every 8 hours as needed for pain (he has run out of this) 3. Promethazine 25 mg every 6 hours as needed for nausea     Objective:   Physical Exam  GENERAL: thin; no acute distress HEAD: atraumatic, normocephalic EYES: pupils equal, round and reactive; sclera anicteric; normal conjunctiva MOUTH/THROAT: oropharynx clear, moist mucous membranes, pink gingiva, carious right lower molar, no evidence of periodontal/gingival disease or abscess, no pain with palpation of the gum NECK: supple, thyroid enlarged but without nodules LYMPH: no cervical or supraclavicular lymphadenopathy LUNGS: clear to auscultation bilaterally, normal work of breathing HEART: normal rate and regular rhythm; normal S1 and S2 without S3 or S4; no murmurs, rubs, or clicks PULSES: radial 2+ and symmetric ABDOMEN: soft, diffusely tender with palpation, normal bowel sounds, no masses GU: no perianal skin, fissures, or external hemorrhoids; rectal vault normal with digital exam, no  internal hemorrhoids or masses palpated; prostate diffusely enlarged but smooth and without nodules MSK: pain with any range of motion exercise of the left shoulder, no  tenderness with palpation of the clavicle SKIN: warm, dry, intact, normal turgor, no rashes EXTREMITIES: no peripheral edema, clubbing, or cyanosis   Filed Vitals:   12/30/12 1458  BP: 143/86  Pulse: 80  Temp: 98.5 F (36.9 C)     BP Readings from Last 3 Encounters:  12/30/12 143/86  12/02/12 132/74  11/06/12 125/77    POC Hemoccult Testing NEGATIVE   12/30/2012          Assessment & Plan:

## 2012-12-30 NOTE — Assessment & Plan Note (Signed)
Explained to patient why we cannot use benzodiazepines right now (substance abuse). We will try buspirone 7.5 mg twice a day and increase later this week if necessary. - Start buspirone and 7.5 mg twice a day

## 2012-12-30 NOTE — Assessment & Plan Note (Signed)
Hemoccult negative today reports intermittent blood in stool. Patient needs a colonoscopy. We will make sure this gets set up after his EGD on Friday. Gastroenterology is unable to accommodate a colonoscopy on Friday with his EGD.

## 2012-12-30 NOTE — Assessment & Plan Note (Addendum)
Carious tooth. No evidence of abscess or infection. Referral made for dentistry. I administered a single 60 mg IM injection of ketorolac today.

## 2012-12-30 NOTE — Assessment & Plan Note (Signed)
Continue methimazole 10 mg daily. Referral made for endocrinology today. Recheck TSH at next visit.

## 2012-12-31 LAB — PRESCRIPTION ABUSE MONITORING 15P, URINE
Barbiturate Screen, Urine: NEGATIVE ng/mL
Buprenorphine, Urine: NEGATIVE ng/mL
Carisoprodol, Urine: NEGATIVE ng/mL
Meperidine, Ur: NEGATIVE ng/mL
Methadone Screen, Urine: NEGATIVE ng/mL
Opiate Screen, Urine: NEGATIVE ng/mL
Propoxyphene: NEGATIVE ng/mL

## 2013-01-01 LAB — BENZODIAZEPINES (GC/LC/MS), URINE
Alprazolam (GC/LC/MS), ur confirm: 56 ng/mL
Alprazolam metabolite (GC/LC/MS), ur confirm: NEGATIVE ng/mL
Clonazepam metabolite (GC/LC/MS), ur confirm: NEGATIVE ng/mL
Estazolam (GC/LC/MS), ur confirm: NEGATIVE ng/mL
Flurazepam metabolite (GC/LC/MS), ur confirm: NEGATIVE ng/mL
Lorazepam (GC/LC/MS), ur confirm: NEGATIVE ng/mL
Midazolam (GC/LC/MS), ur confirm: NEGATIVE ng/mL
Oxazepam (GC/LC/MS), ur confirm: NEGATIVE ng/mL
Temazepam (GC/LC/MS), ur confirm: NEGATIVE ng/mL
Triazolam metabolite (GC/LC/MS), ur confirm: NEGATIVE ng/mL

## 2013-01-01 LAB — OXYCODONE, URINE (LC/MS-MS)
Noroxycodone, Ur: 885 ng/mL
Oxycodone, ur: 444 ng/mL

## 2013-01-02 ENCOUNTER — Encounter: Payer: Self-pay | Admitting: Licensed Clinical Social Worker

## 2013-01-02 ENCOUNTER — Telehealth: Payer: Self-pay | Admitting: *Deleted

## 2013-01-02 DIAGNOSIS — S42002S Fracture of unspecified part of left clavicle, sequela: Secondary | ICD-10-CM

## 2013-01-02 DIAGNOSIS — R109 Unspecified abdominal pain: Secondary | ICD-10-CM

## 2013-01-02 MED ORDER — OXYCODONE HCL 5 MG PO TABS: 5.0000 mg | ORAL_TABLET | Freq: Three times a day (TID) | ORAL | Status: DC | PRN

## 2013-01-02 NOTE — Telephone Encounter (Addendum)
Pt called asking to speak with Dr Earlene Plater.  Pt seen in clinic on 3/31 for same complaint.   Pt states he is having a lot of pain, dental and shoulder, and is out of pain meds.  He is asking, again, for an early refill of Oxycodone.  Refill due 4/10.  Pt is asking for you to call him.  # [phone]   Called and spoke with patient.  Commended patient for his honesty regarding drug use and his recent clean drug screen.  Will give him a 1 week rx for oxycodone and then he can resume his monthly rx on 01/10/2013.  I did warn him that early refills will not be the norm going forward.
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
## 2013-01-03 ENCOUNTER — Encounter: Payer: Self-pay | Admitting: Gastroenterology

## 2013-01-03 ENCOUNTER — Ambulatory Visit (AMBULATORY_SURGERY_CENTER): Payer: No Typology Code available for payment source | Admitting: Gastroenterology

## 2013-01-03 ENCOUNTER — Telehealth: Payer: Self-pay

## 2013-01-03 VITALS — BP 122/75 | HR 80 | Temp 99.2°F | Resp 23 | Ht 71.0 in | Wt 155.0 lb

## 2013-01-03 DIAGNOSIS — R1310 Dysphagia, unspecified: Secondary | ICD-10-CM

## 2013-01-03 DIAGNOSIS — K2990 Gastroduodenitis, unspecified, without bleeding: Secondary | ICD-10-CM

## 2013-01-03 DIAGNOSIS — K2970 Gastritis, unspecified, without bleeding: Secondary | ICD-10-CM

## 2013-01-03 DIAGNOSIS — K209 Esophagitis, unspecified without bleeding: Secondary | ICD-10-CM

## 2013-01-03 DIAGNOSIS — K319 Disease of stomach and duodenum, unspecified: Secondary | ICD-10-CM

## 2013-01-03 HISTORY — DX: Esophagitis, unspecified without bleeding: K20.90

## 2013-01-03 HISTORY — DX: Gastritis, unspecified, without bleeding: K29.70

## 2013-01-03 MED ORDER — SODIUM CHLORIDE 0.9 % IV SOLN: 500.0000 mL | INTRAVENOUS | Status: DC

## 2013-01-03 NOTE — Progress Notes (Signed)
Stable  Coughing denied smoking this am To RR awake

## 2013-01-03 NOTE — Progress Notes (Signed)
Patient did not have preoperative order for IV antibiotic SSI prophylaxis. (G8918)  Patient did not experience any of the following events: a burn prior to discharge; a fall within the facility; wrong site/side/patient/procedure/implant event; or a hospital transfer or hospital admission upon discharge from the facility. (G8907)  

## 2013-01-03 NOTE — Telephone Encounter (Signed)
My office will contact you about follow up appt in 4-5  weeks in the office.

## 2013-01-03 NOTE — Patient Instructions (Addendum)

## 2013-01-03 NOTE — Progress Notes (Signed)
Called to room to assist during endoscopic procedure.  Patient ID and intended procedure confirmed with present staff. Received instructions for my participation in the procedure from the performing physician. ewm 

## 2013-01-03 NOTE — Telephone Encounter (Signed)
appt made letter mailed

## 2013-01-03 NOTE — Op Note (Signed)
[facility] [address] [place], [address]   ENDOSCOPY PROCEDURE REPORT  PATIENT: [name], [name]  MR#: [id number] BIRTHDATE: 03-Apr-1956 , 56  yrs. old GENDER: Male ENDOSCOPIST: Rachael Fee, MD PROCEDURE DATE:  01/03/2013 PROCEDURE:  EGD w/ biopsy ASA CLASS:     Class III INDICATIONS:  intermittent dysphagia, abnormal bariums esophagram. +street drug use (recent cocaine +), Hep C +. MEDICATIONS: MAC sedation, administered by CRNA and propofol (Diprivan) 200mg  IV TOPICAL ANESTHETIC: none  DESCRIPTION OF PROCEDURE: After the risks benefits and alternatives of the procedure were thoroughly explained, informed consent was obtained.  The [id number]-GIF Q180 [id number] endoscope was introduced through the mouth and advanced to the second portion of the duodenum. Without limitations.  The instrument was slowly withdrawn as the mucosa was fully examined.      There was moderate pan-gastritis and at pylorus the mucosa was somewhat more abnormal, perhaps neoplastic without clear mass lesion.  This was biopsied extensively.  There was mild esophagitis.  The examination was otherwise normal.  Retroflexed views revealed no abnormalities.     The scope was then withdrawn from the patient and the procedure completed.  COMPLICATIONS: There were no complications. ENDOSCOPIC IMPRESSION: There was moderate pan-gastritis and at pylorus the mucosa was somewhat more abnormal, perhaps neoplastic without clear mass lesion.  This was biopsied extensively.  There was mild esophagitis.  The examination was otherwise normal.  RECOMMENDATIONS: Await final biopsies. Please start once daily OTC antiacid medicine such as omerpazole 20mg  one pill once daily. Avoid excessive NSAID type otc pain medicines.  My office will contact you about follow up appt in 4-5 weeks in the office.   eSigned:  Rachael Fee, MD 01/03/2013 11:38 AM

## 2013-01-06 ENCOUNTER — Telehealth: Payer: Self-pay | Admitting: *Deleted

## 2013-01-06 NOTE — Telephone Encounter (Signed)
  Follow up Call-  Call back number 01/03/2013  Post procedure Call Back phone  # [phone]  Permission to leave phone message Yes     Patient questions:  Do you have a fever, pain , or abdominal swelling? no Pain Score  0 *  Have you tolerated food without any problems? no Patient stated that he will call Dr. Earlene Plater about this.  He had it before he came to see [place].  Have you been able to return to your normal activities? yes  Do you have any questions about your discharge instructions: Diet   no Medications  no Follow up visit  no  Do you have questions or concerns about your Care? no  Actions: * If pain score is 4 or above: No action needed, pain <4.

## 2013-01-07 ENCOUNTER — Ambulatory Visit: Payer: No Typology Code available for payment source | Attending: Internal Medicine | Admitting: Physical Therapy

## 2013-01-07 ENCOUNTER — Encounter: Payer: Self-pay | Admitting: Gastroenterology

## 2013-01-07 DIAGNOSIS — M25619 Stiffness of unspecified shoulder, not elsewhere classified: Secondary | ICD-10-CM | POA: Insufficient documentation

## 2013-01-07 DIAGNOSIS — M25519 Pain in unspecified shoulder: Secondary | ICD-10-CM | POA: Insufficient documentation

## 2013-01-07 DIAGNOSIS — IMO0001 Reserved for inherently not codable concepts without codable children: Secondary | ICD-10-CM | POA: Insufficient documentation

## 2013-01-10 ENCOUNTER — Encounter: Payer: Self-pay | Admitting: Internal Medicine

## 2013-01-10 NOTE — Telephone Encounter (Signed)
Talked with pt and he states he has not voided in 2 days, onset of vomiting yesterday.  Not able to keep anything down. He states he is dizzy and very weak. Pt advised to go to ED for evaluation as we are not able to see him in the clinic today. Pt voices understanding.

## 2013-01-12 ENCOUNTER — Emergency Department (HOSPITAL_COMMUNITY): Payer: No Typology Code available for payment source

## 2013-01-12 ENCOUNTER — Emergency Department (HOSPITAL_COMMUNITY)
Admission: EM | Admit: 2013-01-12 | Discharge: 2013-01-12 | Disposition: A | Discharge status: Home / Self Care | Payer: No Typology Code available for payment source | Attending: Emergency Medicine | Admitting: Emergency Medicine

## 2013-01-12 ENCOUNTER — Encounter (HOSPITAL_COMMUNITY): Payer: Self-pay | Admitting: Family Medicine

## 2013-01-12 DIAGNOSIS — Z8639 Personal history of other endocrine, nutritional and metabolic disease: Secondary | ICD-10-CM | POA: Insufficient documentation

## 2013-01-12 DIAGNOSIS — Z8719 Personal history of other diseases of the digestive system: Secondary | ICD-10-CM | POA: Insufficient documentation

## 2013-01-12 DIAGNOSIS — F1411 Cocaine abuse, in remission: Secondary | ICD-10-CM | POA: Insufficient documentation

## 2013-01-12 DIAGNOSIS — F1011 Alcohol abuse, in remission: Secondary | ICD-10-CM | POA: Insufficient documentation

## 2013-01-12 DIAGNOSIS — R109 Unspecified abdominal pain: Secondary | ICD-10-CM

## 2013-01-12 DIAGNOSIS — Z8701 Personal history of pneumonia (recurrent): Secondary | ICD-10-CM | POA: Insufficient documentation

## 2013-01-12 DIAGNOSIS — G8929 Other chronic pain: Secondary | ICD-10-CM | POA: Insufficient documentation

## 2013-01-12 DIAGNOSIS — Z8711 Personal history of peptic ulcer disease: Secondary | ICD-10-CM | POA: Insufficient documentation

## 2013-01-12 DIAGNOSIS — Z79899 Other long term (current) drug therapy: Secondary | ICD-10-CM | POA: Insufficient documentation

## 2013-01-12 DIAGNOSIS — Z862 Personal history of diseases of the blood and blood-forming organs and certain disorders involving the immune mechanism: Secondary | ICD-10-CM | POA: Insufficient documentation

## 2013-01-12 DIAGNOSIS — Z8619 Personal history of other infectious and parasitic diseases: Secondary | ICD-10-CM | POA: Insufficient documentation

## 2013-01-12 DIAGNOSIS — B1920 Unspecified viral hepatitis C without hepatic coma: Secondary | ICD-10-CM | POA: Insufficient documentation

## 2013-01-12 DIAGNOSIS — Z8739 Personal history of other diseases of the musculoskeletal system and connective tissue: Secondary | ICD-10-CM | POA: Insufficient documentation

## 2013-01-12 DIAGNOSIS — F17200 Nicotine dependence, unspecified, uncomplicated: Secondary | ICD-10-CM | POA: Insufficient documentation

## 2013-01-12 DIAGNOSIS — Z8781 Personal history of (healed) traumatic fracture: Secondary | ICD-10-CM | POA: Insufficient documentation

## 2013-01-12 LAB — LIPASE, BLOOD: Lipase: 29 U/L (ref 11–59)

## 2013-01-12 LAB — URINALYSIS, ROUTINE W REFLEX MICROSCOPIC
Bilirubin Urine: NEGATIVE
Ketones, ur: NEGATIVE mg/dL
Nitrite: NEGATIVE
Protein, ur: NEGATIVE mg/dL
Specific Gravity, Urine: 1.025 (ref 1.005–1.030)
Urobilinogen, UA: 0.2 mg/dL (ref 0.0–1.0)

## 2013-01-12 LAB — CBC WITH DIFFERENTIAL/PLATELET
Eosinophils Absolute: 0 10*3/uL (ref 0.0–0.7)
Eosinophils Relative: 0 % (ref 0–5)
Hemoglobin: 16.5 g/dL (ref 13.0–17.0)
Lymphs Abs: 2.7 10*3/uL (ref 0.7–4.0)
MCH: 32.7 pg (ref 26.0–34.0)
MCV: 90.3 fL (ref 78.0–100.0)
Monocytes Relative: 9 % (ref 3–12)
RBC: 5.04 MIL/uL (ref 4.22–5.81)

## 2013-01-12 LAB — POCT I-STAT, CHEM 8
BUN: 14 mg/dL (ref 6–23)
Calcium, Ion: 1.18 mmol/L (ref 1.12–1.23)
Glucose, Bld: 104 mg/dL — ABNORMAL HIGH (ref 70–99)
HCT: 44 % (ref 39.0–52.0)
TCO2: 23 mmol/L (ref 0–100)

## 2013-01-12 LAB — COMPREHENSIVE METABOLIC PANEL
AST: 104 U/L — ABNORMAL HIGH (ref 0–37)
Alkaline Phosphatase: 128 U/L — ABNORMAL HIGH (ref 39–117)
BUN: 16 mg/dL (ref 6–23)
CO2: 25 mEq/L (ref 19–32)
GFR calc Af Amer: >90 mL/min (ref >90)
GFR calc non Af Amer: >90 mL/min (ref >90)
Glucose, Bld: 167 mg/dL — ABNORMAL HIGH (ref 70–99)
Potassium: 4.1 mEq/L (ref 3.5–5.1)
Total Bilirubin: 0.4 mg/dL (ref 0.3–1.2)
Total Protein: 8.3 g/dL (ref 6.0–8.3)

## 2013-01-12 MED ORDER — PROMETHAZINE HCL 25 MG PO TABS: 25.0000 mg | ORAL_TABLET | Freq: Four times a day (QID) | ORAL | Status: DC | PRN

## 2013-01-12 MED ORDER — ONDANSETRON HCL 4 MG/2ML IJ SOLN
INTRAMUSCULAR | Status: AC
  Administered 2013-01-12: 4 mg via INTRAVENOUS
  Filled 2013-01-12: qty 2

## 2013-01-12 MED ORDER — HYDROMORPHONE HCL PF 1 MG/ML IJ SOLN
1.0000 mg | Freq: Once | INTRAMUSCULAR | Status: AC
  Administered 2013-01-12: 1 mg via INTRAVENOUS
  Filled 2013-01-12: qty 1

## 2013-01-12 MED ORDER — ONDANSETRON HCL 4 MG/2ML IJ SOLN: 4.0000 mg | Freq: Once | INTRAMUSCULAR | Status: AC

## 2013-01-12 MED ORDER — OXYCODONE-ACETAMINOPHEN 5-325 MG PO TABS: 2.0000 | ORAL_TABLET | ORAL | Status: DC | PRN

## 2013-01-12 MED ORDER — ONDANSETRON HCL 4 MG/2ML IJ SOLN
4.0000 mg | Freq: Once | INTRAMUSCULAR | Status: AC
  Administered 2013-01-12: 4 mg via INTRAVENOUS
  Filled 2013-01-12: qty 2
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
## 2013-01-12 NOTE — ED Notes (Signed)
Per pt sts a few days of lower abdominal pain associated with N,V. sts recent endoscopy. Pt hx of pancreatitis.

## 2013-01-12 NOTE — ED Notes (Signed)
Patient asked for urine sample. States that he has unable to give sample at this time.
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
## 2013-01-14 NOTE — ED Provider Notes (Signed)
Medical screening examination/treatment/procedure(s) were performed by non-physician practitioner and as supervising physician I was immediately available for consultation/collaboration.    Braylan Faul W. Vlasta Baskin, MD 01/14/13 1302 

## 2013-01-15 ENCOUNTER — Encounter: Payer: Self-pay | Admitting: Internal Medicine

## 2013-01-15 ENCOUNTER — Encounter: Payer: Self-pay | Admitting: *Deleted

## 2013-01-15 ENCOUNTER — Encounter: Payer: Self-pay | Admitting: Gastroenterology

## 2013-01-15 ENCOUNTER — Ambulatory Visit: Payer: No Typology Code available for payment source | Admitting: Physical Therapy

## 2013-01-15 NOTE — Progress Notes (Unsigned)
Pt has sent 4 emails this am, very anxious, spoke w/ dr Earlene Plater, he will call pt

## 2013-01-16 ENCOUNTER — Encounter: Payer: Self-pay | Admitting: Internal Medicine

## 2013-01-16 ENCOUNTER — Ambulatory Visit (INDEPENDENT_AMBULATORY_CARE_PROVIDER_SITE_OTHER): Payer: No Typology Code available for payment source | Admitting: Internal Medicine

## 2013-01-16 VITALS — BP 134/80 | HR 100 | Temp 97.8°F | Wt 147.6 lb

## 2013-01-16 DIAGNOSIS — E0590 Thyrotoxicosis, unspecified without thyrotoxic crisis or storm: Secondary | ICD-10-CM

## 2013-01-16 DIAGNOSIS — R1310 Dysphagia, unspecified: Secondary | ICD-10-CM

## 2013-01-16 DIAGNOSIS — N400 Enlarged prostate without lower urinary tract symptoms: Secondary | ICD-10-CM

## 2013-01-16 DIAGNOSIS — S42309S Unspecified fracture of shaft of humerus, unspecified arm, sequela: Secondary | ICD-10-CM

## 2013-01-16 DIAGNOSIS — K921 Melena: Secondary | ICD-10-CM

## 2013-01-16 DIAGNOSIS — S42002S Fracture of unspecified part of left clavicle, sequela: Secondary | ICD-10-CM

## 2013-01-16 DIAGNOSIS — K0889 Other specified disorders of teeth and supporting structures: Secondary | ICD-10-CM

## 2013-01-16 DIAGNOSIS — K089 Disorder of teeth and supporting structures, unspecified: Secondary | ICD-10-CM

## 2013-01-16 DIAGNOSIS — F411 Generalized anxiety disorder: Secondary | ICD-10-CM

## 2013-01-16 LAB — TSH: TSH: 2.148 u[IU]/mL (ref 0.350–4.500)

## 2013-01-16 MED ORDER — TERAZOSIN HCL 1 MG PO CAPS: 1.0000 mg | ORAL_CAPSULE | Freq: Every day | ORAL | Status: DC

## 2013-01-16 MED ORDER — METHIMAZOLE 10 MG PO TABS: 10.0000 mg | ORAL_TABLET | Freq: Every day | ORAL | Status: DC

## 2013-01-16 MED ORDER — ALPRAZOLAM 0.25 MG PO TABS: 0.2500 mg | ORAL_TABLET | Freq: Three times a day (TID) | ORAL | Status: DC | PRN

## 2013-01-16 MED ORDER — PAROXETINE HCL 20 MG PO TABS: 20.0000 mg | ORAL_TABLET | ORAL | Status: DC

## 2013-01-16 MED ORDER — MORPHINE SULFATE ER 15 MG PO TBCR: 15.0000 mg | EXTENDED_RELEASE_TABLET | Freq: Every day | ORAL | Status: DC

## 2013-01-16 NOTE — Assessment & Plan Note (Addendum)
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 Concerning structural abnormalities, including malignancy, have been excluded with EGD. Consider barium swallow in the future.

## 2013-01-16 NOTE — Patient Instructions (Addendum)
We are on the waiting list for: 1.  ORTHOPEDICS 2.  ENDOCRINOLOGY  You have an appointment with DENTISTRY on May 1st.  You have an appointment with GASTROENTEROLOGY on May 2nd.  Xanax is for one month only.  Start taking PAXIL and go to [facility] and follow their instructions.  [facility]: [facility], [place] [address] [place], ACCESS LINE: [phone] or [phone], [email or web address]

## 2013-01-16 NOTE — Assessment & Plan Note (Signed)
Renal function remains normal. We will start terazosin 1 mg every evening and up titrate as needed. - Start terazosin 1 mg every evening, uptitrate as needed

## 2013-01-16 NOTE — Assessment & Plan Note (Addendum)
While we wait for endocrinology, we will have to maintain him on methimazole as best we can.  TSH and FT4 were both normal.  This is excellent news.  We will keep him on methimazole 10 mg daily.

## 2013-01-16 NOTE — Assessment & Plan Note (Addendum)
Although many of his complaints compete for the most disrupting and concerning symptoms, this is probably the worst. I think it certainly complicates most of his other symptoms. As such, I think it's best to be more aggressive with this symptom. I have stressed the importance of establishing care with [facility] behavioral health services. He has promised me that he will go 2 [facility], followup with them, and anticipate in any therapy they recommend. For pharmacotherapy, we will start paroxetine 20 mg every morning. I am also giving him a one-month supply of alprazolam 0.25 mg 3 times a day when necessary. I stressed to him several times that this is a one-month prescription only. We will not refill this after one month. We will need to control his anxiety with SSRIs and therapy/counseling through [facility]. He agreed to this. I also stressed the importance of abstaining completely from alcohol use. He states that he is not using alcohol currently and does not plan to ever again. - Establish with [facility] - Start paroxetine 20 mg every morning - One-month supply of alprazolam 0.25 mg 3 times a day when necessary

## 2013-01-16 NOTE — Progress Notes (Signed)
Subjective:    Patient ID: [name], male    DOB: Apr 17, 1956, 57 y.o.   MRN: [id number]  HPI:  [name] is a 57 year old man with numerous physical complaints.  He is suffering from pain in his left shoulder, hyperthyroidism, and myriad other complaints: tinnitus, blurred vision, nausea, weakness, difficulty in initiating micturition, anxiety, tooth pain, nausea, vomiting, and stomach pain.  He has been struggling with these symptoms for some time, and it is taking [place] a while to work through each of them as he has limited resources and is on the verge of homelessness. He does have the orange card, and he agrees we are slowly but surely making progress.  He was recently seen by Dr. Rob Bunting with gastroenterology for an EGD. There was no evidence of stricture or other structural abnormality to explain dysphagia. There was abnormal-appearing mucosa in the stomach and pylorus. Biopsies were taken, which showed reactive gastropathy but no malignancy. He is scheduled to see Dr. Christella Hartigan for followup on May 2.  His primary psychiatric complaint is anxiety and restlessness. Previously, he denied having depression but admits today that he does feel depressed at times and attributes this feeling to his many physical ailments. Still, anxiety is a more predominant symptom than depression is. About a month and a half ago, he went on a drinking binge, which is not typical for him. During this episode, he did have suicidal ideations but without a clear plan. He currently reports abstinence from alcohol, cocaine, marijuana, and other substances except for tobacco. He denies suicidal, self-harm, and homicidal ideations.  With the orange card, he is on the waiting list to see endocrinology and orthopedic surgery. They do not have any openings currently.  He is scheduled to see dentistry on May 1 for his dental caries and dental pain.    Review of Systems  Constitutional: Positive for chills, fatigue and  unexpected weight change.  HENT: Positive for trouble swallowing and tinnitus.   Eyes: Positive for visual disturbance.  Respiratory: Negative for shortness of breath.   Cardiovascular: Negative for chest pain.  Gastrointestinal: Positive for nausea, vomiting, abdominal pain, constipation and blood in stool.  Genitourinary: Positive for difficulty urinating. Negative for dysuria and flank pain.  Musculoskeletal: Positive for arthralgias.  Neurological: Positive for dizziness and light-headedness.  Psychiatric/Behavioral: Positive for sleep disturbance and dysphoric mood. Negative for suicidal ideas and self-injury. The patient is nervous/anxious.     Current Medications: 1. Oxycodone 5 mg every 8 hours as needed for pain 2. Omeprazole OTC, recommended by Dr. Christella Hartigan 3. Methimazole 10 mg daily * Patient never filled buspirone or finasteride.     Objective:   Physical Exam:  GENERAL: thin; no acute distress HEAD: atraumatic, normocephalic EYES: pupils equal, round and reactive; sclera anicteric; normal conjunctiva MOUTH/THROAT: oropharynx clear, moist mucous membranes, pink gingiva, very poor dentition with missing teeth and numerous caries NECK: supple, thyroid normal in size and without palpable nodules LUNGS: clear to auscultation bilaterally, normal work of breathing HEART: normal rate and regular rhythm; normal S1 and S2 without S3 or S4; no murmurs, rubs, or clicks PULSES: radial 2+ and symmetric ABDOMEN: soft; tenderness with palpation in all quadrants but most pronounced in the periumbilical area, no urinary bladder distention appreciated; no masses or organomegaly; normal bowel sounds SKIN: warm, dry, intact, normal turgor, no rashes EXTREMITIES: no peripheral edema, clubbing, or cyanosis PSYCH: patient is alert and oriented, mood and affect are depressed and congruent, thought content is normal without delusions,  thought process is linear, speech is normal and non-pressured,  behavior is normal, judgement and insight are normal, no suicidal or homicidal ideations  Filed Vitals:   01/16/13 1334  BP: 134/80  Pulse: 100  Temp: 97.8 F (36.6 C)    BP Readings from Last 3 Encounters:  01/16/13 134/80  01/12/13 105/58  01/03/13 122/75    Thyroid Function Testing Component Value Date/Time   TSH 2.148 01/16/2013 1512   FREE T4 0.89 01/16/2013 1513         Assessment & Plan:

## 2013-01-16 NOTE — Assessment & Plan Note (Signed)
No evidence of infection. He has an appointment with dentistry on May 1.

## 2013-01-16 NOTE — Assessment & Plan Note (Signed)
We are on the waiting list for orthopedic surgery. I think this is the only definitive answer to this problem. In the meantime, I am escalating his opioid analgesic regimen by adding MS Contin 15 mg once daily. - Continue oxycodone 5 mg every 8 hours as needed for pain - Start MS Contin 15 mg daily, 30 pills dispensed on 01/16/2013 - Continue physical therapy

## 2013-01-16 NOTE — Assessment & Plan Note (Addendum)
[name] has followup with Dr. Rob Bunting of gastroenterology on May 2. I have sent a message to Dr. Christella Hartigan via Epic, thanking him for his help with this patient and asking him to consider a screening colonoscopy.

## 2013-01-22 ENCOUNTER — Ambulatory Visit: Payer: No Typology Code available for payment source | Admitting: Physical Therapy

## 2013-01-23 ENCOUNTER — Encounter: Payer: Self-pay | Admitting: Internal Medicine

## 2013-01-23 ENCOUNTER — Ambulatory Visit: Payer: No Typology Code available for payment source | Admitting: Physical Therapy

## 2013-01-24 ENCOUNTER — Encounter: Payer: Self-pay | Admitting: Internal Medicine

## 2013-01-24 ENCOUNTER — Ambulatory Visit (INDEPENDENT_AMBULATORY_CARE_PROVIDER_SITE_OTHER): Payer: No Typology Code available for payment source | Admitting: Internal Medicine

## 2013-01-24 VITALS — BP 137/78 | HR 83 | Temp 100.3°F | Ht 71.0 in | Wt 151.1 lb

## 2013-01-24 DIAGNOSIS — K047 Periapical abscess without sinus: Secondary | ICD-10-CM | POA: Insufficient documentation

## 2013-01-24 MED ORDER — IBUPROFEN 600 MG PO TABS: 600.0000 mg | ORAL_TABLET | Freq: Four times a day (QID) | ORAL | Status: DC | PRN

## 2013-01-24 MED ORDER — CLINDAMYCIN HCL 150 MG PO CAPS: 450.0000 mg | ORAL_CAPSULE | Freq: Three times a day (TID) | ORAL | Status: AC
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
## 2013-01-24 NOTE — Assessment & Plan Note (Signed)
Assessment: The patient has an early developing dental abscess surrounding his decayed right lower molar. No drainage from the involved area. Given progression of infection, I will initiate antibiotics today. He is already scheduled with his dentist on Jan 30, 2013 for extraction.  Plan:      Clindamycin 450mg  TID x 5 days.  Followup with dentist ASAP.  High dose ibuprofen 600mg  q8h PRN WC for pain and inflammation.

## 2013-01-24 NOTE — Patient Instructions (Addendum)
General Instructions:  Please follow-up at the clinic in 3 weeks, at which time we will reevaluate your tooth pain - OR, please follow-up in the clinic sooner if needed.  There have been changes in your medications:  START Clindamycin (antibiotic) for your tooth - read the information below.  START Ibuprofen up to three times a day for your pain and inflammation.   SEE YOUR DENTIST ASAP  If you have been started on new medication(s), and you develop symptoms concerning for allergic reaction, including, but not limited to, throat closing, tongue swelling, rash, please stop the medication immediately and call the clinic at [phone], and go to the ER.  If symptoms worsen, or new symptoms arise, please call the clinic or go to the ER.  PLEASE BRING ALL OF YOUR MEDICATIONS  IN A BAG TO YOUR NEXT APPOINTMENT   Treatment Goals:  Goals (1 Years of Data) as of 01/24/13         As of Today 01/16/13 01/12/13 01/12/13 01/12/13     Blood Pressure    . Blood Pressure < 140/90  137/78 134/80 105/58 108/71 146/71     Lifestyle    . Quit smoking / using tobacco   No        Result Component    . LDL CALC < 160            Progress Toward Treatment Goals:  Treatment Goal 12/02/2012  Stop smoking smoking the same amount    Self Care Goals & Plans:  Self Care Goal 12/02/2012  Manage my medications take my medicines as prescribed; bring my medications to every visit; refill my medications on time  Eat healthy foods eat foods that are low in salt  Stop smoking -       Care Management & Community Referrals:  Referral 01/02/2013  Referrals made for care management support -  Referrals made to community resources other (see comments)        Clindamycin capsules What is this medicine? CLINDAMYCIN (KLIN da MYE sin) is a lincosamide antibiotic. It is used to treat certain kinds of bacterial infections. It will not work for colds, flu, or other viral infections. This medicine may be used for  other purposes; ask your health care provider or pharmacist if you have questions. What should I tell my health care provider before I take this medicine? They need to know if you have any of these conditions: -kidney disease -liver disease -stomach problems like colitis -an unusual or allergic reaction to clindamycin, lincomycin, or other medicines, foods, dyes like tartrazine or preservatives -pregnant or trying to get pregnant -breast-feeding How should I use this medicine? Take this medicine by mouth with a full glass of water. Follow the directions on the prescription label. You can take this medicine with food or on an empty stomach. If the medicine upsets your stomach, take it with food. Take your medicine at regular intervals. Do not take your medicine more often than directed. Take all of your medicine as directed even if you think your are better. Do not skip doses or stop your medicine early. Talk to your pediatrician regarding the use of this medicine in children. Special care may be needed. Overdosage: If you think you have taken too much of this medicine contact a poison control center or emergency room at once. NOTE: This medicine is only for you. Do not share this medicine with others. What if I miss a dose? If you miss a dose, take  it as soon as you can. If it is almost time for your next dose, take only that dose. Do not take double or extra doses. What may interact with this medicine? -chloramphenicol -erythromycin -kaolin products This list may not describe all possible interactions. Give your health care provider a list of all the medicines, herbs, non-prescription drugs, or dietary supplements you use. Also tell them if you smoke, drink alcohol, or use illegal drugs. Some items may interact with your medicine. What should I watch for while using this medicine? Tell your doctor or healthcare professional if your symptoms do not start to get better or if they get worse. Do  not treat diarrhea with over the counter products. Contact your doctor if you have diarrhea that lasts more than 2 days or if it is severe and watery. What side effects may I notice from receiving this medicine? Side effects that you should report to your doctor or health care professional as soon as possible: -allergic reactions like skin rash, itching or hives, swelling of the face, lips, or tongue -dark urine -pain on swallowing -redness, blistering, peeling or loosening of the skin, including inside the mouth -unusual bleeding or bruising -unusually weak or tired -yellowing of eyes or skin Side effects that usually do not require medical attention (report to your doctor or health care professional if they continue or are bothersome): -diarrhea -itching in the rectal or genital area -joint pain -nausea, vomiting -stomach pain This list may not describe all possible side effects. Call your doctor for medical advice about side effects. You may report side effects to FDA at 1-800-FDA-1088. Where should I keep my medicine? Keep out of the reach of children. Store at room temperature between 20 and 25 degrees C (68 and 77 degrees F). Throw away any unused medicine after the expiration date. NOTE: This sheet is a summary. It may not cover all possible information. If you have questions about this medicine, talk to your doctor, pharmacist, or health care provider.  2012, Elsevier/Gold Standard. (02/09/2010 10:12:31 AM)  Dental Abscess A dental abscess is a collection of infected fluid (pus) from a bacterial infection in the inner part of the tooth (pulp). It usually occurs at the end of the tooth's root.  CAUSES   Severe tooth decay.  Trauma to the tooth that allows bacteria to enter into the pulp, such as a broken or chipped tooth. SYMPTOMS   Severe pain in and around the infected tooth.  Swelling and redness around the abscessed tooth or in the mouth or face.  Tenderness.  Pus  drainage.  Bad breath.  Bitter taste in the mouth.  Difficulty swallowing.  Difficulty opening the mouth.  Nausea.  Vomiting.  Chills.  Swollen neck glands. DIAGNOSIS   A medical and dental history will be taken.  An examination will be performed by tapping on the abscessed tooth.  X-rays may be taken of the tooth to identify the abscess. TREATMENT The goal of treatment is to eliminate the infection. You may be prescribed antibiotic medicine to stop the infection from spreading. A root canal may be performed to save the tooth. If the tooth cannot be saved, it may be pulled (extracted) and the abscess may be drained.  HOME CARE INSTRUCTIONS  Only take over-the-counter or prescription medicines for pain, fever, or discomfort as directed by your caregiver.  Rinse your mouth (gargle) often with salt water ( tsp salt in 8 oz of warm water) to relieve pain or swelling.  Do  not drive after taking pain medicine (narcotics).  Do not apply heat to the outside of your face.  Return to your dentist for further treatment as directed. SEEK MEDICAL CARE IF:  Your pain is not helped by medicine.  Your pain is getting worse instead of better. SEEK IMMEDIATE MEDICAL CARE IF:  You have a fever or persistent symptoms for more than 2 3 days.  You have a fever and your symptoms suddenly get worse.  You have chills or a very bad headache.  You have problems breathing or swallowing.  You have trouble opening your mouth.  You have swelling in the neck or around the eye. Document Released: 09/18/2005 Document Revised: 03/19/2012 Document Reviewed: 12/27/2010 [facility] Patient Information 2013 [place], [place].

## 2013-01-27 ENCOUNTER — Other Ambulatory Visit: Payer: Self-pay | Admitting: Internal Medicine

## 2013-01-27 ENCOUNTER — Encounter: Payer: Self-pay | Admitting: Internal Medicine

## 2013-01-27 ENCOUNTER — Telehealth: Payer: Self-pay | Admitting: *Deleted

## 2013-01-27 MED ORDER — OMEPRAZOLE 20 MG PO CPDR: 20.0000 mg | DELAYED_RELEASE_CAPSULE | Freq: Every day | ORAL | Status: DC

## 2013-01-27 NOTE — Telephone Encounter (Signed)
Prescription for Omeprazole 20 mg capsules 1 po daily.  Dispense # 30 with no refills was called to [facility] per order of Dr. Earlene Plater.  Angelina Ok, RN 01/27/2013 11:38 AM.

## 2013-01-28 ENCOUNTER — Encounter: Payer: Self-pay | Admitting: Gastroenterology

## 2013-01-28 ENCOUNTER — Encounter: Payer: Self-pay | Admitting: Internal Medicine

## 2013-01-31 ENCOUNTER — Ambulatory Visit: Payer: Self-pay | Admitting: Gastroenterology

## 2013-02-03 NOTE — Progress Notes (Signed)
Case discussed with Dr. Kalia-Reynolds at the time of the visit, immediately after the resident saw the patient.  I reviewed the resident's history and exam and pertinent patient test results.  I agree with the assessment, diagnosis and plan of care documented in the resident's note.   

## 2013-02-10 ENCOUNTER — Other Ambulatory Visit: Payer: Self-pay | Admitting: *Deleted

## 2013-02-10 DIAGNOSIS — S42002S Fracture of unspecified part of left clavicle, sequela: Secondary | ICD-10-CM

## 2013-02-10 MED ORDER — OXYCODONE HCL 5 MG PO TABA: 1.0000 | ORAL_TABLET | Freq: Three times a day (TID) | ORAL | Status: DC | PRN

## 2013-02-10 MED ORDER — MORPHINE SULFATE ER 15 MG PO TBCR: 15.0000 mg | EXTENDED_RELEASE_TABLET | Freq: Two times a day (BID) | ORAL | Status: DC

## 2013-02-10 NOTE — Telephone Encounter (Signed)
Also needs refill oxycodone 5 mg.

## 2013-02-10 NOTE — Telephone Encounter (Signed)
MS Contin filled at [facility] on 01/16/2013.  I will increase the MS Contin dose to 15 mg PO BID which is the typical minimal dose.

## 2013-02-13 ENCOUNTER — Ambulatory Visit: Payer: Self-pay

## 2013-02-14 ENCOUNTER — Ambulatory Visit (INDEPENDENT_AMBULATORY_CARE_PROVIDER_SITE_OTHER): Payer: No Typology Code available for payment source | Admitting: Gastroenterology

## 2013-02-14 ENCOUNTER — Encounter: Payer: Self-pay | Admitting: Gastroenterology

## 2013-02-14 ENCOUNTER — Encounter: Payer: Self-pay | Admitting: Internal Medicine

## 2013-02-14 VITALS — BP 128/80 | HR 60 | Ht 69.5 in | Wt 152.2 lb

## 2013-02-14 DIAGNOSIS — Z1211 Encounter for screening for malignant neoplasm of colon: Secondary | ICD-10-CM

## 2013-02-14 DIAGNOSIS — K219 Gastro-esophageal reflux disease without esophagitis: Secondary | ICD-10-CM

## 2013-02-14 DIAGNOSIS — B182 Chronic viral hepatitis C: Secondary | ICD-10-CM

## 2013-02-14 MED ORDER — PEG-KCL-NACL-NASULF-NA ASC-C 100 G PO SOLR: 1.0000 | Freq: Once | ORAL | Status: DC
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
## 2013-02-14 NOTE — Patient Instructions (Addendum)
Referral to Hep C clinic to consider eradication (genotype 1). We will contact the Hep C Clinic and they will be contacting you to schedule you with and appointment. You will be set up for a colonoscopy for routine screening (MAC, [facility]).  You have been schedule for an Colonoscopy with Propofol in the [facility]. With instructions given. Your prep kit has been sent to your pharmacy, Please pick up your prep kit within the next 3 days. If you use any type of inhalers please bring them with you the day of your procedure

## 2013-02-17 ENCOUNTER — Encounter: Payer: Self-pay | Admitting: Internal Medicine

## 2013-02-17 ENCOUNTER — Ambulatory Visit (INDEPENDENT_AMBULATORY_CARE_PROVIDER_SITE_OTHER): Payer: No Typology Code available for payment source | Admitting: Internal Medicine

## 2013-02-17 VITALS — BP 132/77 | HR 66 | Temp 97.1°F | Ht 71.0 in | Wt 155.0 lb

## 2013-02-17 DIAGNOSIS — E0590 Thyrotoxicosis, unspecified without thyrotoxic crisis or storm: Secondary | ICD-10-CM

## 2013-02-17 DIAGNOSIS — Z129 Encounter for screening for malignant neoplasm, site unspecified: Secondary | ICD-10-CM

## 2013-02-17 DIAGNOSIS — B1920 Unspecified viral hepatitis C without hepatic coma: Secondary | ICD-10-CM

## 2013-02-17 DIAGNOSIS — Z0289 Encounter for other administrative examinations: Secondary | ICD-10-CM | POA: Insufficient documentation

## 2013-02-17 DIAGNOSIS — K0889 Other specified disorders of teeth and supporting structures: Secondary | ICD-10-CM

## 2013-02-17 DIAGNOSIS — E786 Lipoprotein deficiency: Secondary | ICD-10-CM

## 2013-02-17 DIAGNOSIS — R319 Hematuria, unspecified: Secondary | ICD-10-CM | POA: Insufficient documentation

## 2013-02-17 DIAGNOSIS — H9313 Tinnitus, bilateral: Secondary | ICD-10-CM

## 2013-02-17 DIAGNOSIS — F17200 Nicotine dependence, unspecified, uncomplicated: Secondary | ICD-10-CM

## 2013-02-17 DIAGNOSIS — IMO0001 Reserved for inherently not codable concepts without codable children: Secondary | ICD-10-CM

## 2013-02-17 DIAGNOSIS — S42002D Fracture of unspecified part of left clavicle, subsequent encounter for fracture with routine healing: Secondary | ICD-10-CM

## 2013-02-17 DIAGNOSIS — K921 Melena: Secondary | ICD-10-CM

## 2013-02-17 DIAGNOSIS — F411 Generalized anxiety disorder: Secondary | ICD-10-CM

## 2013-02-17 DIAGNOSIS — Z9189 Other specified personal risk factors, not elsewhere classified: Secondary | ICD-10-CM

## 2013-02-17 DIAGNOSIS — H9319 Tinnitus, unspecified ear: Secondary | ICD-10-CM

## 2013-02-17 DIAGNOSIS — N400 Enlarged prostate without lower urinary tract symptoms: Secondary | ICD-10-CM

## 2013-02-17 DIAGNOSIS — Z23 Encounter for immunization: Secondary | ICD-10-CM

## 2013-02-17 MED ORDER — PAROXETINE HCL 20 MG PO TABS: 30.0000 mg | ORAL_TABLET | ORAL | Status: DC

## 2013-02-17 MED ORDER — MORPHINE SULFATE ER 15 MG PO TBCR: 15.0000 mg | EXTENDED_RELEASE_TABLET | Freq: Every morning | ORAL | Status: DC

## 2013-02-17 MED ORDER — OXYCODONE HCL 5 MG PO TABS: 5.0000 mg | ORAL_TABLET | Freq: Three times a day (TID) | ORAL | Status: DC | PRN

## 2013-02-17 MED ORDER — METHIMAZOLE 10 MG PO TABS: 10.0000 mg | ORAL_TABLET | Freq: Every day | ORAL | Status: DC

## 2013-02-17 MED ORDER — TERAZOSIN HCL 1 MG PO CAPS: 2.0000 mg | ORAL_CAPSULE | Freq: Every day | ORAL | Status: DC

## 2013-02-17 NOTE — Assessment & Plan Note (Addendum)
Urinalysis is normal and without hemoglobin. Most likely transient hematuria from urolithiasis. Patient at risk for urothelial cancer with his tobacco use, but gross hematuria unlikely to be from this. We will continue to monitor this clinically, watching for recurrence. - Urinalysis today-negative

## 2013-02-17 NOTE — Assessment & Plan Note (Signed)
Improved. Patient saw dentistry on May 1. They did several teeth extractions then. They are planning on a full extraction on Thursday with reconstruction to follow.

## 2013-02-17 NOTE — Assessment & Plan Note (Signed)
Patient was recently seen by Dr. Christella Hartigan of gastroenterology. They have scheduled him for a colonoscopy on June 18.

## 2013-02-17 NOTE — Patient Instructions (Addendum)
Increase TERAZOSIN to 2 mg (2 tablets) at bedtime every day.  Increase PAROXETINE (PAXIL) to 30 mg (1-1/2 tablets) every morning.  Referral made to audiology.

## 2013-02-17 NOTE — Assessment & Plan Note (Signed)
Assessment: Progress toward smoking cessation:  smoking less Barriers to progress toward smoking cessation:  withdrawal symptoms  Plan: Instruction/counseling given:  I counseled patient on the dangers of tobacco use and advised patient to stop smoking. Medications to assist with smoking cessation:  None

## 2013-02-17 NOTE — Assessment & Plan Note (Addendum)
Lipid Panel  Component Value Date/Time   CHOL 131 07/04/2012 0615   TRIG 131 07/04/2012 0615   HDL 20* 07/04/2012 0615   LDLCALC 85 07/04/2012 0615   Patient's 10 year CVD risk is 15.2% if he continues to smoke; it is 8.5% if he were to stop smoking. I counseled the patient on the need to quit smoking. With his active liver disease and off his other problems, the patient decided to wait on beginning statin therapy.

## 2013-02-17 NOTE — Assessment & Plan Note (Signed)
-   UDS - Continue oxycodone 5 mg 3 times a day when necessary - Continue MS Contin 15 mg every morning

## 2013-02-17 NOTE — Assessment & Plan Note (Addendum)
Patient's 10 year CVD risk is 15.2% if he continues to smoke; it is 8.5% if he were to stop smoking. I counseled the patient on the need to quit smoking. With his active liver disease and off his other problems, the patient decided to wait on beginning statin therapy. With his gastritis, hematuria, hematochezia; I will wait on starting aspirin as well.

## 2013-02-17 NOTE — Assessment & Plan Note (Signed)
Patient will receive the final doses of the HAV and HBV vaccination series today. At his last appointment with gastroenterology, they referred him to the hepatitis clinic.

## 2013-02-17 NOTE — Assessment & Plan Note (Signed)
I discussed with the patient the risks of prostate cancer screening with PSA and the benefits. After going over in depth the protocol for working up a positive PSA test, the risks and benefits of cancer treatment, the risks of urology workup, and the benefits of cancer detection and mortality improvements; the patient decided to not undergo PSA testing today. He does have a screening colonoscopy scheduled for next month.

## 2013-02-17 NOTE — Assessment & Plan Note (Signed)
Because of the high pitched and nonpulsatile nature, this is most likely a symptom arising from the auditory system rather than vascular. We will begin with audiology evaluation. - Referral to audiology

## 2013-02-17 NOTE — Assessment & Plan Note (Signed)
Symptoms are beginning to improve with terazosin. - Increase terazosin to 2 mg at bedtime

## 2013-02-17 NOTE — Assessment & Plan Note (Signed)
Continue current pain regimen. UDS today. We will try to refer him to orthopedics again. He needs to have that rod taken out. - Ortho referral - UDS - Continue oxycodone 5 mg 3 times a day when necessary - Continue MS Contin 15 mg every morning

## 2013-02-17 NOTE — Assessment & Plan Note (Signed)
Improving. We will increase paroxetine to 30 mg every morning. He can continue taking alprazolam when necessary. We will address refilling this at his next visit. - Increase paroxetine to 30 mg every morning - Continue alprazolam 0.25 mg 3 times a day when necessary

## 2013-02-17 NOTE — Assessment & Plan Note (Signed)
Stable. Recent thyroid function tests were within normal limits. We will continue methimazole 10 mg daily. We will try again to refer him to endocrinology.
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
## 2013-02-18 ENCOUNTER — Encounter: Payer: Self-pay | Admitting: Internal Medicine

## 2013-02-18 LAB — PRESCRIPTION ABUSE MONITORING 15P, URINE
Buprenorphine, Urine: NEGATIVE ng/mL
Cocaine Metabolites: NEGATIVE ng/mL
Creatinine, Urine: 105.52 mg/dL (ref >20.0)
Methadone Screen, Urine: NEGATIVE ng/mL
Tramadol Scrn, Ur: NEGATIVE ng/mL
Zolpidem, Urine: NEGATIVE ng/mL

## 2013-02-18 LAB — URINALYSIS, ROUTINE W REFLEX MICROSCOPIC
Glucose, UA: NEGATIVE mg/dL
Hgb urine dipstick: NEGATIVE
Leukocytes, UA: NEGATIVE
Nitrite: NEGATIVE
Protein, ur: NEGATIVE mg/dL
Urobilinogen, UA: 1 mg/dL (ref 0.0–1.0)

## 2013-02-19 NOTE — Progress Notes (Signed)
Case discussed with Dr. Wallace immediately after the resident saw the patient. We reviewed the resident's history and exam and pertinent patient test results. I agree with the assessment, diagnosis and plan of care documented in the resident's note. 

## 2013-02-21 LAB — BENZODIAZEPINES (GC/LC/MS), URINE
Alprazolam (GC/LC/MS), ur confirm: NEGATIVE ng/mL
Alprazolam metabolite (GC/LC/MS), ur confirm: NEGATIVE ng/mL
Clonazepam metabolite (GC/LC/MS), ur confirm: NEGATIVE ng/mL
Estazolam (GC/LC/MS), ur confirm: NEGATIVE ng/mL
Flunitrazepam metabolite (GC/LC/MS), ur confirm: NEGATIVE ng/mL
Flurazepam metabolite (GC/LC/MS), ur confirm: NEGATIVE ng/mL
Lorazepam (GC/LC/MS), ur confirm: NEGATIVE ng/mL

## 2013-02-21 LAB — OPIATES/OPIOIDS (LC/MS-MS)
Heroin (6-AM), UR: NEGATIVE ng/mL
Morphine Urine: 2897 ng/mL
Oxycodone, ur: 180 ng/mL
Oxymorphone: NEGATIVE ng/mL

## 2013-02-21 LAB — OXYCODONE, URINE (LC/MS-MS): Oxymorphone: NEGATIVE ng/mL

## 2013-02-25 ENCOUNTER — Ambulatory Visit: Payer: No Typology Code available for payment source | Admitting: Physical Therapy

## 2013-02-28 ENCOUNTER — Emergency Department (HOSPITAL_COMMUNITY): Payer: No Typology Code available for payment source

## 2013-02-28 ENCOUNTER — Emergency Department (HOSPITAL_COMMUNITY)
Admission: EM | Admit: 2013-02-28 | Discharge: 2013-03-01 | Disposition: A | Discharge status: Home / Self Care | Payer: No Typology Code available for payment source | Attending: Emergency Medicine | Admitting: Emergency Medicine

## 2013-02-28 ENCOUNTER — Encounter (HOSPITAL_COMMUNITY): Payer: Self-pay | Admitting: Emergency Medicine

## 2013-02-28 DIAGNOSIS — E0590 Thyrotoxicosis, unspecified without thyrotoxic crisis or storm: Secondary | ICD-10-CM | POA: Insufficient documentation

## 2013-02-28 DIAGNOSIS — Z8619 Personal history of other infectious and parasitic diseases: Secondary | ICD-10-CM | POA: Insufficient documentation

## 2013-02-28 DIAGNOSIS — W172XXA Fall into hole, initial encounter: Secondary | ICD-10-CM | POA: Insufficient documentation

## 2013-02-28 DIAGNOSIS — M545 Low back pain, unspecified: Secondary | ICD-10-CM | POA: Insufficient documentation

## 2013-02-28 DIAGNOSIS — G8929 Other chronic pain: Secondary | ICD-10-CM | POA: Insufficient documentation

## 2013-02-28 DIAGNOSIS — F411 Generalized anxiety disorder: Secondary | ICD-10-CM | POA: Insufficient documentation

## 2013-02-28 DIAGNOSIS — Z8709 Personal history of other diseases of the respiratory system: Secondary | ICD-10-CM | POA: Insufficient documentation

## 2013-02-28 DIAGNOSIS — M129 Arthropathy, unspecified: Secondary | ICD-10-CM | POA: Insufficient documentation

## 2013-02-28 DIAGNOSIS — Y92009 Unspecified place in unspecified non-institutional (private) residence as the place of occurrence of the external cause: Secondary | ICD-10-CM | POA: Insufficient documentation

## 2013-02-28 DIAGNOSIS — Z8719 Personal history of other diseases of the digestive system: Secondary | ICD-10-CM | POA: Insufficient documentation

## 2013-02-28 DIAGNOSIS — Z79899 Other long term (current) drug therapy: Secondary | ICD-10-CM | POA: Insufficient documentation

## 2013-02-28 DIAGNOSIS — Z9889 Other specified postprocedural states: Secondary | ICD-10-CM | POA: Insufficient documentation

## 2013-02-28 DIAGNOSIS — S92211A Displaced fracture of cuboid bone of right foot, initial encounter for closed fracture: Secondary | ICD-10-CM

## 2013-02-28 DIAGNOSIS — Z8701 Personal history of pneumonia (recurrent): Secondary | ICD-10-CM | POA: Insufficient documentation

## 2013-02-28 DIAGNOSIS — S92213A Displaced fracture of cuboid bone of unspecified foot, initial encounter for closed fracture: Secondary | ICD-10-CM | POA: Insufficient documentation

## 2013-02-28 DIAGNOSIS — F17200 Nicotine dependence, unspecified, uncomplicated: Secondary | ICD-10-CM | POA: Insufficient documentation

## 2013-02-28 DIAGNOSIS — Y9389 Activity, other specified: Secondary | ICD-10-CM | POA: Insufficient documentation

## 2013-02-28 DIAGNOSIS — Z87448 Personal history of other diseases of urinary system: Secondary | ICD-10-CM | POA: Insufficient documentation

## 2013-02-28 DIAGNOSIS — Z8669 Personal history of other diseases of the nervous system and sense organs: Secondary | ICD-10-CM | POA: Insufficient documentation

## 2013-02-28 DIAGNOSIS — Z8781 Personal history of (healed) traumatic fracture: Secondary | ICD-10-CM | POA: Insufficient documentation

## 2013-02-28 NOTE — ED Notes (Signed)
C/o R foot pain/swelling since stepping in a hole he had dug for flowers around 8pm.

## 2013-02-28 NOTE — ED Notes (Signed)
Patient transported to X-ray 

## 2013-03-01 MED ORDER — HYDROCODONE-ACETAMINOPHEN 5-325 MG PO TABS: 1.0000 | ORAL_TABLET | Freq: Four times a day (QID) | ORAL | Status: DC | PRN

## 2013-03-01 MED ORDER — ONDANSETRON HCL 4 MG PO TABS: 4.0000 mg | ORAL_TABLET | Freq: Four times a day (QID) | ORAL | Status: DC

## 2013-03-01 MED ORDER — MORPHINE SULFATE 4 MG/ML IJ SOLN
4.0000 mg | Freq: Once | INTRAMUSCULAR | Status: AC
  Administered 2013-03-01: 4 mg via INTRAMUSCULAR
  Filled 2013-03-01: qty 1

## 2013-03-01 MED ORDER — ONDANSETRON 4 MG PO TBDP
4.0000 mg | ORAL_TABLET | Freq: Once | ORAL | Status: AC
Start: 2013-03-01 — End: 2013-03-01
  Administered 2013-03-01: 4 mg via ORAL
  Filled 2013-03-01: qty 1
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
## 2013-03-01 NOTE — Progress Notes (Signed)
Orthopedic Tech Progress Note Patient Details:  [name] 03-28-1956 [id number]  Ortho Devices Type of Ortho Device: CAM walker   Haskell Flirt 03/01/2013, 1:15 AM

## 2013-03-01 NOTE — ED Provider Notes (Signed)
Medical screening examination/treatment/procedure(s) were performed by non-physician practitioner and as supervising physician I was immediately available for consultation/collaboration.   Julie Manly, MD 03/01/13 0834 

## 2013-03-01 NOTE — ED Notes (Signed)
Pt reports falling in hole in yard yesterday evening and injuring right foot. Pt reports swelling and pain to the right foot. Reports pain 10/10. Right foot swollen and painful to the touch.

## 2013-03-03 ENCOUNTER — Telehealth: Payer: Self-pay | Admitting: Gastroenterology

## 2013-03-03 ENCOUNTER — Encounter: Payer: Self-pay | Admitting: Internal Medicine

## 2013-03-03 MED ORDER — MOVIPREP 100 G PO SOLR: 1.0000 | Freq: Once | ORAL | Status: DC

## 2013-03-03 NOTE — Telephone Encounter (Signed)
rx was resent electronically and faxed

## 2013-03-04 ENCOUNTER — Other Ambulatory Visit: Payer: Self-pay | Admitting: *Deleted

## 2013-03-04 MED ORDER — OMEPRAZOLE 20 MG PO CPDR: 20.0000 mg | DELAYED_RELEASE_CAPSULE | Freq: Every day | ORAL | Status: DC

## 2013-03-04 NOTE — Telephone Encounter (Signed)
Rx faxed to pharmacy  

## 2013-03-07 ENCOUNTER — Telehealth: Payer: Self-pay | Admitting: Gastroenterology

## 2013-03-07 DIAGNOSIS — B182 Chronic viral hepatitis C: Secondary | ICD-10-CM

## 2013-03-07 NOTE — Telephone Encounter (Signed)
Dr Christella Hartigan they are one in the same.  [facility] may have something but without insurance treatment is very costly.  Please advise.

## 2013-03-07 NOTE — Telephone Encounter (Signed)
He referral to [facility] hepatology clinic was declined due to lack of insurance.  Can you refer him to the [facility] Hep C clinic in [place].  thanks

## 2013-03-10 ENCOUNTER — Encounter: Payer: Self-pay | Admitting: Gastroenterology

## 2013-03-10 ENCOUNTER — Ambulatory Visit (INDEPENDENT_AMBULATORY_CARE_PROVIDER_SITE_OTHER): Payer: No Typology Code available for payment source | Admitting: Internal Medicine

## 2013-03-10 ENCOUNTER — Telehealth: Payer: Self-pay | Admitting: Gastroenterology

## 2013-03-10 ENCOUNTER — Encounter: Payer: Self-pay | Admitting: Internal Medicine

## 2013-03-10 VITALS — BP 139/84 | HR 70 | Temp 97.7°F | Ht 71.0 in | Wt 157.0 lb

## 2013-03-10 DIAGNOSIS — N400 Enlarged prostate without lower urinary tract symptoms: Secondary | ICD-10-CM

## 2013-03-10 DIAGNOSIS — H9313 Tinnitus, bilateral: Secondary | ICD-10-CM

## 2013-03-10 DIAGNOSIS — H9319 Tinnitus, unspecified ear: Secondary | ICD-10-CM

## 2013-03-10 DIAGNOSIS — R109 Unspecified abdominal pain: Secondary | ICD-10-CM

## 2013-03-10 DIAGNOSIS — E0590 Thyrotoxicosis, unspecified without thyrotoxic crisis or storm: Secondary | ICD-10-CM

## 2013-03-10 DIAGNOSIS — Z79899 Other long term (current) drug therapy: Secondary | ICD-10-CM

## 2013-03-10 DIAGNOSIS — Z0289 Encounter for other administrative examinations: Secondary | ICD-10-CM

## 2013-03-10 DIAGNOSIS — F411 Generalized anxiety disorder: Secondary | ICD-10-CM

## 2013-03-10 DIAGNOSIS — IMO0001 Reserved for inherently not codable concepts without codable children: Secondary | ICD-10-CM

## 2013-03-10 DIAGNOSIS — R52 Pain, unspecified: Secondary | ICD-10-CM

## 2013-03-10 DIAGNOSIS — R319 Hematuria, unspecified: Secondary | ICD-10-CM

## 2013-03-10 DIAGNOSIS — S42002D Fracture of unspecified part of left clavicle, subsequent encounter for fracture with routine healing: Secondary | ICD-10-CM

## 2013-03-10 MED ORDER — TERAZOSIN HCL 5 MG PO CAPS: 5.0000 mg | ORAL_CAPSULE | Freq: Every day | ORAL | Status: DC

## 2013-03-10 MED ORDER — OMEPRAZOLE 20 MG PO CPDR: 20.0000 mg | DELAYED_RELEASE_CAPSULE | Freq: Every day | ORAL | Status: DC

## 2013-03-10 MED ORDER — METHIMAZOLE 10 MG PO TABS: 10.0000 mg | ORAL_TABLET | Freq: Every day | ORAL | Status: DC

## 2013-03-10 MED ORDER — OXYCODONE HCL 5 MG PO TABS: 5.0000 mg | ORAL_TABLET | Freq: Three times a day (TID) | ORAL | Status: DC | PRN

## 2013-03-10 MED ORDER — PAROXETINE HCL 40 MG PO TABS: 40.0000 mg | ORAL_TABLET | ORAL | Status: DC

## 2013-03-10 MED ORDER — MORPHINE SULFATE ER 15 MG PO TBCR: 15.0000 mg | EXTENDED_RELEASE_TABLET | Freq: Every morning | ORAL | Status: DC

## 2013-03-10 NOTE — Telephone Encounter (Signed)
i will discuss this with him at his upcoming colonoscopy

## 2013-03-11 ENCOUNTER — Encounter: Payer: Self-pay | Admitting: Internal Medicine

## 2013-03-11 ENCOUNTER — Telehealth: Payer: Self-pay | Admitting: *Deleted

## 2013-03-11 ENCOUNTER — Ambulatory Visit (INDEPENDENT_AMBULATORY_CARE_PROVIDER_SITE_OTHER): Payer: No Typology Code available for payment source | Admitting: Internal Medicine

## 2013-03-11 ENCOUNTER — Ambulatory Visit (HOSPITAL_COMMUNITY)
Admission: RE | Admit: 2013-03-11 | Discharge: 2013-03-11 | Disposition: A | Discharge status: Home / Self Care | Payer: No Typology Code available for payment source | Source: Ambulatory Visit | Attending: Internal Medicine | Admitting: Internal Medicine

## 2013-03-11 VITALS — BP 126/82 | HR 87 | Temp 97.2°F | Ht 69.5 in | Wt 147.4 lb

## 2013-03-11 DIAGNOSIS — R109 Unspecified abdominal pain: Secondary | ICD-10-CM | POA: Insufficient documentation

## 2013-03-11 DIAGNOSIS — R0789 Other chest pain: Secondary | ICD-10-CM | POA: Insufficient documentation

## 2013-03-11 DIAGNOSIS — W1809XA Striking against other object with subsequent fall, initial encounter: Secondary | ICD-10-CM | POA: Insufficient documentation

## 2013-03-11 DIAGNOSIS — R319 Hematuria, unspecified: Secondary | ICD-10-CM | POA: Insufficient documentation

## 2013-03-11 DIAGNOSIS — I700 Atherosclerosis of aorta: Secondary | ICD-10-CM | POA: Insufficient documentation

## 2013-03-11 DIAGNOSIS — R6883 Chills (without fever): Secondary | ICD-10-CM | POA: Insufficient documentation

## 2013-03-11 DIAGNOSIS — R52 Pain, unspecified: Secondary | ICD-10-CM

## 2013-03-11 DIAGNOSIS — S3981XA Other specified injuries of abdomen, initial encounter: Secondary | ICD-10-CM | POA: Insufficient documentation

## 2013-03-11 LAB — URINALYSIS, ROUTINE W REFLEX MICROSCOPIC
Leukocytes, UA: NEGATIVE
Nitrite: NEGATIVE
Protein, ur: NEGATIVE mg/dL
Specific Gravity, Urine: 1.017 (ref 1.005–1.030)
Urobilinogen, UA: 1 mg/dL (ref 0.0–1.0)

## 2013-03-11 NOTE — Assessment & Plan Note (Addendum)
Patient reports acute onset right sided chest/flank pain status post a mechanical fall hitting his right chest on home furniture.  The pain is worse with activities and deep breathing, better with sitting still and resting.  No other associated symptoms. Physical examination reviews tenderness to palpation on the right-sidedchest wall.   Right sided rib x-ray is negative for fracture. Abdominal/pelvic CT is unremarkable for acute abnormalities.  No nephrolithiasis noted  -His clinical manifestations is consistent with musculoskeletal pain associated with his mechanical fall.  Other differential diagnosis is shingles ( given the derma zone area), which is unlikely since his symptoms associated with mechanical fall and no skin rash or vesicles noted on exam.  - Reassurance is given since he is very anxious.   - Will not prescribe any pain medications since he is on chronic pain medications.  I will not  recommend any additional OTC pain medications including NSAID given history of gastritis and esophagitis.  - Ice packs to his pain area a couple of times daily. - Patient is instructed to come back to clinic or go to ED if his symptoms are worsening or he develop new symptoms.  He understands the plan.

## 2013-03-11 NOTE — Patient Instructions (Signed)
1. Please follow up with your PCP as instructed. 2. Please call the clinic or go to the ED if your symptoms are worsening or you develops new symptoms
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
## 2013-03-11 NOTE — Telephone Encounter (Signed)
Pt was messaged per my chart and told he could come by the office and pick up a free movi voucher

## 2013-03-11 NOTE — Telephone Encounter (Signed)
rec'd 2 mychart messages, called pt, he states last night he had trouble breathing and his abd pain is increasing greatly, he denies resp distress at this time and has no trouble conversing. He states he cannot move without being in severe pain, he is place on schedule at 1415 dr Dierdre Searles

## 2013-03-11 NOTE — Telephone Encounter (Signed)
Agree, he needs to be seen in clinic or if not he needs to go to the ED

## 2013-03-12 ENCOUNTER — Other Ambulatory Visit (HOSPITAL_COMMUNITY): Payer: No Typology Code available for payment source

## 2013-03-13 ENCOUNTER — Other Ambulatory Visit: Payer: Self-pay

## 2013-03-13 ENCOUNTER — Ambulatory Visit (HOSPITAL_COMMUNITY): Payer: No Typology Code available for payment source

## 2013-03-13 ENCOUNTER — Encounter: Payer: Self-pay | Admitting: Internal Medicine

## 2013-03-13 ENCOUNTER — Encounter (HOSPITAL_COMMUNITY): Payer: Self-pay | Admitting: Cardiology

## 2013-03-13 ENCOUNTER — Emergency Department (HOSPITAL_COMMUNITY): Payer: No Typology Code available for payment source

## 2013-03-13 ENCOUNTER — Emergency Department (HOSPITAL_COMMUNITY)
Admission: EM | Admit: 2013-03-13 | Discharge: 2013-03-13 | Disposition: A | Discharge status: Home / Self Care | Payer: No Typology Code available for payment source | Attending: Emergency Medicine | Admitting: Emergency Medicine

## 2013-03-13 DIAGNOSIS — G8929 Other chronic pain: Secondary | ICD-10-CM | POA: Insufficient documentation

## 2013-03-13 DIAGNOSIS — K861 Other chronic pancreatitis: Secondary | ICD-10-CM | POA: Insufficient documentation

## 2013-03-13 DIAGNOSIS — Z8619 Personal history of other infectious and parasitic diseases: Secondary | ICD-10-CM | POA: Insufficient documentation

## 2013-03-13 DIAGNOSIS — M545 Low back pain, unspecified: Secondary | ICD-10-CM | POA: Insufficient documentation

## 2013-03-13 DIAGNOSIS — F17200 Nicotine dependence, unspecified, uncomplicated: Secondary | ICD-10-CM | POA: Insufficient documentation

## 2013-03-13 DIAGNOSIS — F1011 Alcohol abuse, in remission: Secondary | ICD-10-CM | POA: Insufficient documentation

## 2013-03-13 DIAGNOSIS — M159 Polyosteoarthritis, unspecified: Secondary | ICD-10-CM | POA: Insufficient documentation

## 2013-03-13 DIAGNOSIS — R109 Unspecified abdominal pain: Secondary | ICD-10-CM

## 2013-03-13 DIAGNOSIS — F1410 Cocaine abuse, uncomplicated: Secondary | ICD-10-CM | POA: Insufficient documentation

## 2013-03-13 DIAGNOSIS — Z8719 Personal history of other diseases of the digestive system: Secondary | ICD-10-CM | POA: Insufficient documentation

## 2013-03-13 DIAGNOSIS — F411 Generalized anxiety disorder: Secondary | ICD-10-CM | POA: Insufficient documentation

## 2013-03-13 DIAGNOSIS — Z8679 Personal history of other diseases of the circulatory system: Secondary | ICD-10-CM | POA: Insufficient documentation

## 2013-03-13 DIAGNOSIS — Z8669 Personal history of other diseases of the nervous system and sense organs: Secondary | ICD-10-CM | POA: Insufficient documentation

## 2013-03-13 DIAGNOSIS — N400 Enlarged prostate without lower urinary tract symptoms: Secondary | ICD-10-CM | POA: Insufficient documentation

## 2013-03-13 DIAGNOSIS — Z8781 Personal history of (healed) traumatic fracture: Secondary | ICD-10-CM | POA: Insufficient documentation

## 2013-03-13 DIAGNOSIS — K219 Gastro-esophageal reflux disease without esophagitis: Secondary | ICD-10-CM | POA: Insufficient documentation

## 2013-03-13 DIAGNOSIS — Z8709 Personal history of other diseases of the respiratory system: Secondary | ICD-10-CM | POA: Insufficient documentation

## 2013-03-13 DIAGNOSIS — Z8701 Personal history of pneumonia (recurrent): Secondary | ICD-10-CM | POA: Insufficient documentation

## 2013-03-13 DIAGNOSIS — Z79899 Other long term (current) drug therapy: Secondary | ICD-10-CM | POA: Insufficient documentation

## 2013-03-13 LAB — URINALYSIS, ROUTINE W REFLEX MICROSCOPIC
Glucose, UA: NEGATIVE mg/dL
Hgb urine dipstick: NEGATIVE
Ketones, ur: NEGATIVE mg/dL
Leukocytes, UA: NEGATIVE
pH: 6 (ref 5.0–8.0)

## 2013-03-13 LAB — CBC
Platelets: 360 10*3/uL (ref 150–400)
RBC: 4.85 MIL/uL (ref 4.22–5.81)
RDW: 13.3 % (ref 11.5–15.5)
WBC: 9.8 10*3/uL (ref 4.0–10.5)

## 2013-03-13 LAB — COMPREHENSIVE METABOLIC PANEL
ALT: 59 U/L — ABNORMAL HIGH (ref 0–53)
AST: 40 U/L — ABNORMAL HIGH (ref 0–37)
Albumin: 3.7 g/dL (ref 3.5–5.2)
Alkaline Phosphatase: 94 U/L (ref 39–117)
CO2: 25 mEq/L (ref 19–32)
Chloride: 100 mEq/L (ref 96–112)
Creatinine, Ser: 0.78 mg/dL (ref 0.50–1.35)
Potassium: 4.1 mEq/L (ref 3.5–5.1)
Sodium: 135 mEq/L (ref 135–145)
Total Bilirubin: 0.5 mg/dL (ref 0.3–1.2)

## 2013-03-13 MED ORDER — SODIUM CHLORIDE 0.9 % IV SOLN
INTRAVENOUS | Status: DC
  Administered 2013-03-13: 16:00:00 via INTRAVENOUS

## 2013-03-13 MED ORDER — ONDANSETRON HCL 4 MG/2ML IJ SOLN
4.0000 mg | Freq: Once | INTRAMUSCULAR | Status: AC
  Administered 2013-03-13: 4 mg via INTRAVENOUS
  Filled 2013-03-13: qty 2

## 2013-03-13 NOTE — ED Notes (Signed)
Pt to department via EMS reports RUQ pain for the past couple of days and n/v. States hx of pancreatitis. States that he has not been able to keep anything down. Bp-119/83 Hr-72 CBG-140 4mg  Zofran given by EMS.
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
## 2013-03-13 NOTE — Assessment & Plan Note (Signed)
Stable. Awaiting endocrinology referral.
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
## 2013-03-13 NOTE — Assessment & Plan Note (Signed)
Symptoms continue. Referral made to urology. Terazosin increased to 5 mg every evening.  New Rx given for 5 mg tablets.

## 2013-03-13 NOTE — Assessment & Plan Note (Signed)
Injured his right rib cage during a fall. No evidence on exam of fracture. Advise patient to rest and ice the site. No need for imaging or referral for this.

## 2013-03-13 NOTE — Assessment & Plan Note (Signed)
Patient previously agreed to a single, one-month supply of alprazolam while we uptitrate paroxetine. This one-month supply was for April and it has run out. Increase paroxetine from 30 mg every morning to 40 mg. - Increase paroxetine to 40 mg every morning, Rx given for 40 mg tablets

## 2013-03-13 NOTE — Assessment & Plan Note (Signed)
Awaiting referral to orthopedics. We called [facility], and he is apparently very close to getting this scheduled. Hopefully within the next month or two.

## 2013-03-13 NOTE — Assessment & Plan Note (Signed)
An appointment with audiology has been scheduled.

## 2013-03-13 NOTE — Assessment & Plan Note (Signed)
Twice now he has reported hematuria and twice now we have found no hemoglobin and his urine. His hemoglobin today does have bilirubin. I wonder if this is not hematuria at all but a different process resulting in darkened urine. At his next appointment, if this problem continues, consider liver function testing to evaluate bilirubin and consider LDH and/or haptoglobin.

## 2013-03-13 NOTE — Progress Notes (Signed)
Subjective:    Patient ID: [name], male    DOB: Sep 08, 1956, 57 y.o.   MRN: [id number]  CC: fall with flank pain  HPI:  57 year old man with complex medical history (see list below) who comes to clinic for routine followup and with acute complaint.  Fall Patient stepped in a hole and was seen in the emergency department on 5/30. Radiographs of the foot were obtained demonstrating a small avulsion fracture of his right cuboid bone. He was placed into a walking boot. 2 days ago, the patient was walking around in his house and lost balance because of this boot and fell. He struck his refrigerator with his right side and now has pain in his right rib cage. Pain is described as an ache. The pain is severe. Pain is located in the mid axillary line on the right over approximately the eighth rib. No radiation. Pain is constant since the injury. Alleviated by his oral opioid analgesics. Aggravated by deep breathing and certain movements. Not associated with any other symptoms.  BPH  Two months ago, we started the patient on terazosin one milligrams at bedtime. Last month we increased this to 2 mg. He notes some improvement in his symptoms but is still suffering from week strain, difficulty initiating a stream, incomplete emptying, and pain with urination. He has had several episodes of hematuria as well. Urinalysis at last visit was negative for hemoglobin.  Anxiety  At his visit in April, we started paroxetine 20 mg daily and alprazolam 0.25 mg 3 times a day. Last month, we increased the paroxetine to 30 mg daily. We did not refill alprazolam. He says the anxiety is "bad".  Symptoms include agitation, rapid heartbeat, and diaphoresis.  Tinnitus  No changed. Onset was several months ago. It has been constant since then. It is all day every day. It is a high pitched constant ringing in both ears. It does not throb or pulsate. There is no ear pain or loss of hearing.   Review of Systems   Constitutional: Positive for fatigue. Negative for fever and chills.  HENT: Positive for tinnitus.   Gastrointestinal: Positive for abdominal pain.  Genitourinary: Positive for hematuria, flank pain and difficulty urinating.  Psychiatric/Behavioral: Positive for agitation.    Current Medications: 1. Methimazole 10 mg daily 2. Morphine 12-hr 15 mg every morning 3. Omeprazole 20 mg daily 4. Ondansetron 4 mg every 6 hours when necessary for nausea 5. Oxycodone 5 mg Q8 hours when necessary for pain 6. Paroxetine 30 mg every morning 7. Promethazine 25 mg every 6 hours when necessary for nausea 8. Terazosin 2 mg every evening      Objective:   Physical Exam: GENERAL: thin; no acute distress HEAD: atraumatic, normocephalic LUNGS: clear to auscultation bilaterally, normal work of breathing HEART: normal rate and regular rhythm; normal S1 and S2 without S3 or S4; no murmurs, rubs, or clicks PULSES: radial 2+ and symmetric ABDOMEN: diffuse tenderness throughout; no guarding, rigidity, or rebound tenderness; normal bowel sounds, no masses or organomegaly; no CVA tenderness MSK: tenderness with palpation of the right lateral rib cage in the midaxillary line, no overlying erythema or ecchymosis, no crepitus, no deformities, no flail chest with inspiration/expiration SKIN: warm, dry, intact, normal turgor, no rashes EXTREMITIES: no peripheral edema, clubbing, or cyanosis PSYCH: patient is alert and oriented, mood and affect are normal and congruent, thought content is normal without delusions, thought process is linear, speech is normal and non-pressured, behavior is normal   Filed Vitals:  03/10/13 1429  BP: 139/84  Pulse: 70  Temp: 97.7 F (36.5 C)    BP Readings from Last 3 Encounters:  03/13/13 119/73  03/11/13 126/82  03/10/13 139/84    Urinalysis Component Value Date/Time   COLORURINE AMBER* 03/13/2013 1440   APPEARANCEUR HAZY* 03/13/2013 1440   LABSPEC 1.023 03/13/2013  1440   PHURINE 6.0 03/13/2013 1440   GLUCOSEU NEGATIVE 03/13/2013 1440   HGBUR NEGATIVE 03/13/2013 1440   BILIRUBINUR SMALL* 03/13/2013 1440   KETONESUR NEGATIVE 03/13/2013 1440   PROTEINUR NEGATIVE 03/13/2013 1440   UROBILINOGEN 1.0 03/13/2013 1440   NITRITE NEGATIVE 03/13/2013 1440   LEUKOCYTESUR NEGATIVE 03/13/2013 1440        Assessment & Plan:

## 2013-03-14 NOTE — Progress Notes (Signed)
Case discussed with Dr. Earlene Plater at the time of visit. We reviewed the resident's history and exam and pertinent patient test results. I agree with the assessment, diagnosis and plan of care documented in the resident's note.

## 2013-03-14 NOTE — ED Provider Notes (Signed)
Medical screening examination/treatment/procedure(s) were performed by non-physician practitioner and as supervising physician I was immediately available for consultation/collaboration.   Charmaine Placido J. Arminta Gamm, MD 03/14/13 1055 

## 2013-03-14 NOTE — ED Provider Notes (Signed)
I saw and evaluated the patient, reviewed the resident's note and I agree with the findings and plan.   Charles B. Bernette Mayers, MD 03/14/13 1610

## 2013-03-17 ENCOUNTER — Encounter: Payer: Self-pay | Admitting: Gastroenterology

## 2013-03-17 ENCOUNTER — Telehealth: Payer: Self-pay | Admitting: Gastroenterology

## 2013-03-17 NOTE — Telephone Encounter (Signed)
Pt will call pharmacy and see if its correct

## 2013-03-18 ENCOUNTER — Encounter: Payer: Self-pay | Admitting: Gastroenterology

## 2013-03-18 NOTE — ED Provider Notes (Signed)
Medical screening examination/treatment/procedure(s) were performed by non-physician practitioner and as supervising physician I was immediately available for consultation/collaboration.   Abubakr Wieman J. Anastacio Bua, MD 03/18/13 0902 

## 2013-03-19 ENCOUNTER — Encounter: Payer: Self-pay | Admitting: Gastroenterology

## 2013-03-19 ENCOUNTER — Ambulatory Visit (AMBULATORY_SURGERY_CENTER): Payer: No Typology Code available for payment source | Admitting: Gastroenterology

## 2013-03-19 VITALS — BP 133/80 | HR 71 | Temp 98.1°F | Resp 37 | Ht 69.5 in | Wt 152.0 lb

## 2013-03-19 DIAGNOSIS — Z1211 Encounter for screening for malignant neoplasm of colon: Secondary | ICD-10-CM

## 2013-03-19 MED ORDER — SODIUM CHLORIDE 0.9 % IV SOLN: 500.0000 mL | INTRAVENOUS | Status: DC
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
## 2013-03-19 NOTE — Patient Instructions (Addendum)

## 2013-03-19 NOTE — Op Note (Signed)
[facility] [address] [place], [address]   COLONOSCOPY PROCEDURE REPORT  PATIENT: [name], [name]  MR#: [id number] BIRTHDATE: 30-Mar-1956 , 56  yrs. old GENDER: Male ENDOSCOPIST: Rachael Fee, MD PROCEDURE DATE:  03/19/2013 PROCEDURE:   Colonoscopy, screening ASA CLASS:   Class III INDICATIONS:average risk screening. MEDICATIONS: MAC sedation, administered by CRNA and propofol (Diprivan) 250mg  IV  DESCRIPTION OF PROCEDURE:   After the risks benefits and alternatives of the procedure were thoroughly explained, informed consent was obtained.  A digital rectal exam revealed no abnormalities of the rectum.   The LB [id number]  endoscope was introduced through the anus and advanced to the cecum, which was identified by both the appendix and ileocecal valve. No adverse events experienced.   The quality of the prep was fair.  The instrument was then slowly withdrawn as the colon was fully examined.   COLON FINDINGS: A normal appearing cecum, ileocecal valve, and appendiceal orifice were identified.  The ascending, hepatic flexure, transverse, splenic flexure, descending, sigmoid colon and rectum appeared unremarkable.  No polyps or cancers were seen. Retroflexed views revealed no abnormalities. The time to cecum=8 minutes 38 seconds.  Withdrawal time=7 minutes 09 seconds.  The scope was withdrawn and the procedure completed. COMPLICATIONS: There were no complications.  ENDOSCOPIC IMPRESSION: Normal colon No polyps or cancers  RECOMMENDATIONS: You should continue to follow colorectal cancer screening guidelines for "routine risk" patients with a repeat colonoscopy in 10 years. There is no need for FOBT (stool) testing for at least 5 years.   eSigned:  Rachael Fee, MD 03/19/2013 11:30 AM

## 2013-03-19 NOTE — Progress Notes (Signed)
Lidocaine-40mg IV prior to Propofol InductionPropofol given over incremental dosages 

## 2013-03-20 ENCOUNTER — Telehealth: Payer: Self-pay | Admitting: *Deleted

## 2013-03-20 NOTE — Telephone Encounter (Signed)
Number identifier, left message, follow-up  

## 2013-03-25 ENCOUNTER — Encounter: Payer: Self-pay | Admitting: Gastroenterology

## 2013-03-25 NOTE — Progress Notes (Signed)
Case discussed with Dr. Li (at time of visit, soon after the resident saw the patient).  We reviewed the resident's history and exam and pertinent patient test results.  I agree with the assessment, diagnosis, and plan of care documented in the resident's note.  

## 2013-03-27 ENCOUNTER — Ambulatory Visit: Payer: No Typology Code available for payment source | Attending: Internal Medicine | Admitting: Audiology

## 2013-03-27 DIAGNOSIS — M25619 Stiffness of unspecified shoulder, not elsewhere classified: Secondary | ICD-10-CM | POA: Insufficient documentation

## 2013-03-27 DIAGNOSIS — H9313 Tinnitus, bilateral: Secondary | ICD-10-CM

## 2013-03-27 DIAGNOSIS — IMO0001 Reserved for inherently not codable concepts without codable children: Secondary | ICD-10-CM | POA: Insufficient documentation

## 2013-03-27 DIAGNOSIS — M25519 Pain in unspecified shoulder: Secondary | ICD-10-CM | POA: Insufficient documentation

## 2013-03-27 DIAGNOSIS — H9193 Unspecified hearing loss, bilateral: Secondary | ICD-10-CM
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
## 2013-03-27 NOTE — Patient Instructions (Signed)
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  The notching at 4000Hz  on the audiogram is indicative of damage due to noise exposure.  His speech understanding in quiet is good however, falls significantly when a background noise is present.  There are no positive retrocochlear signs.  Middle ear function is good.  Recommendations:  1.  Evaluation by an ENT to obtain clearance for a tinnitus masker or high frequency amplification (hearing aids).  2.  To minimize the affects of the tinnitus he should avoid quiet and caffeine, decrease stress and use noise maskers at home such as a sound machine, quiet music, a fan or other background noise at a volume just loud enough to mask the high pitched tinnitus. 3.  [facility] has a wonderful Tinnitus [facility] and may be able to provide tinnitus training/therapy that would be most beneficial.  They are located at: [address], [place], [place] [address]. Phone: [phone]     Allyn Kenner. Larence Penning, Au.DAnnie Main- Audiology 03/27/2013 2:32 PM

## 2013-03-28 ENCOUNTER — Encounter: Payer: Self-pay | Admitting: Internal Medicine

## 2013-03-28 NOTE — Progress Notes (Signed)
Called pt. He has appt on the 9th July and prefers to wait to be referred.

## 2013-04-03 ENCOUNTER — Encounter: Payer: Self-pay | Admitting: Internal Medicine

## 2013-04-03 NOTE — Progress Notes (Signed)
Pt was referred to uro for BPH and hematuria. Uro stated would only see once  1. Contrasted CT to eval upper tract 2. Documentation of hematuria  I cannot find any documentation of hematuria.   I am not comfortable ordering a CT on a pt I have not seen and examined. His BPH meds were increased last visit so will aks that pt keep his appt 7/9 with Dr Shirlee Latch to reval sxs and determine if sxs warrant a CT.

## 2013-04-08 ENCOUNTER — Other Ambulatory Visit: Payer: Self-pay | Admitting: *Deleted

## 2013-04-08 DIAGNOSIS — S42002D Fracture of unspecified part of left clavicle, subsequent encounter for fracture with routine healing: Secondary | ICD-10-CM

## 2013-04-08 MED ORDER — OXYCODONE HCL 5 MG PO TABS: 5.0000 mg | ORAL_TABLET | Freq: Three times a day (TID) | ORAL | Status: DC | PRN

## 2013-04-08 NOTE — Telephone Encounter (Signed)
I called and spoke with Mr. Mabee, confirmed his identity with birth date.    I reviewed the Narcotic database and found 2 refills in the last 6 months filled outside of his pain contract with [place].  I discussed this finding with Mr. Leder and informed him of the nature of the pain contract and requested him to no longer have narcotics filled from other providers.  I told him that if he continues to have Rx's from other providers, we would have to reconsider him getting narcotics from our clinic.  I asked him if he understood this, and he stated that he did.    I have filled his Rx.  He has an appointment with Dr. Shirlee Latch tomorrow.

## 2013-04-09 ENCOUNTER — Ambulatory Visit (INDEPENDENT_AMBULATORY_CARE_PROVIDER_SITE_OTHER): Payer: No Typology Code available for payment source | Admitting: Internal Medicine

## 2013-04-09 VITALS — BP 109/65 | HR 84 | Temp 97.3°F | Ht 70.0 in | Wt 153.1 lb

## 2013-04-09 DIAGNOSIS — H9313 Tinnitus, bilateral: Secondary | ICD-10-CM

## 2013-04-09 DIAGNOSIS — H9319 Tinnitus, unspecified ear: Secondary | ICD-10-CM

## 2013-04-09 DIAGNOSIS — N400 Enlarged prostate without lower urinary tract symptoms: Secondary | ICD-10-CM

## 2013-04-09 DIAGNOSIS — S42002S Fracture of unspecified part of left clavicle, sequela: Secondary | ICD-10-CM

## 2013-04-09 DIAGNOSIS — S42309S Unspecified fracture of shaft of humerus, unspecified arm, sequela: Secondary | ICD-10-CM

## 2013-04-09 DIAGNOSIS — R319 Hematuria, unspecified: Secondary | ICD-10-CM

## 2013-04-09 LAB — BASIC METABOLIC PANEL
CO2: 28 mEq/L (ref 19–32)
Chloride: 103 mEq/L (ref 96–112)
Creat: 0.85 mg/dL (ref 0.50–1.35)
Potassium: 4 mEq/L (ref 3.5–5.3)

## 2013-04-09 MED ORDER — OXYCODONE HCL ER 10 MG PO T12A: 10.0000 mg | EXTENDED_RELEASE_TABLET | Freq: Two times a day (BID) | ORAL | Status: DC

## 2013-04-09 NOTE — Patient Instructions (Addendum)
Please follow up in 10 days Go get xray of Clavicle and shoulder  I will be in touch about your labs  We are working on the orthopedics appointment   Pain Medicine Instructions You have been given a prescription for pain medicines. These medicines may affect your ability to think clearly. They may also affect your ability to perform physical activities. Take these medicines only as needed for pain. You do not need to take them if you are not having pain, unless directed by your caregiver. You can take less than the prescribed dose if you find a smaller amount of medicine controls the pain. It may not be possible to make all of your pain go away, but you should be comfortable enough to move, breathe, and take care of yourself. After you start taking pain medicines, while taking the medicines, and for 8 hours after stopping the medicines:  Do not drive.  Do not operate machinery.  Do not operate power tools.  Do not sign legal documents.  Do not supervise children by yourself.  Do not participate in activities that require climbing or being in high places.  Do not enter a body of water (lake, river, ocean, spa, swimming pool) without an adult nearby who can help you. You may have been prescribed a pain medicine that contains acetaminophen (paracetamol). If so, take only the amount directed by your caregiver. Do not take any other acetaminophen while taking this medicine. An overdose of acetaminophen can result in severe liver damage. If you are taking other medicines, check the active ingredients for acetaminophen. Acetaminophen is found in hundreds of over-the-counter and prescription medicines. These include cold relief products, menstrual cramp relief medicines, fever-reducing medicines, acid indigestion relief products, and pain relief products. HOME CARE INSTRUCTIONS   Do not drink alcohol, take sleeping pills, or take other medicines until at least 8 hours after your last dose of pain  medicine, or as directed by your caregiver.  Use a bulk stool softener if you become constipated from your pain medicines. Increasing your intake of fruits and vegetables will also help.  Write down the times when you take your medicines. Look at the times before taking your next dose of medicine. It is easy to become confused while on pain medicines. Recording the times helps you to avoid an overdose. SEEK MEDICAL CARE IF:  Your medicine is not helping the pain go away.  You vomit or have diarrhea shortly after taking the medicine.  You develop new pain in areas that did not hurt before. SEEK IMMEDIATE MEDICAL CARE IF:  You feel dizzy or faint.  You feel there are other problems that might be caused by your medicine. MAKE SURE YOU:   Understand these instructions.  Will watch your condition.  Will get help right away if you are not doing well or get worse. Document Released: 12/25/2000 Document Revised: 12/11/2011 Document Reviewed: 09/02/2010 [facility] Patient Information 2014 [place], [place].

## 2013-04-10 ENCOUNTER — Encounter: Payer: Self-pay | Admitting: Internal Medicine

## 2013-04-10 LAB — URINALYSIS, ROUTINE W REFLEX MICROSCOPIC
Glucose, UA: NEGATIVE mg/dL
Hgb urine dipstick: NEGATIVE
Ketones, ur: NEGATIVE mg/dL
Leukocytes, UA: NEGATIVE
Specific Gravity, Urine: 1.02 (ref 1.005–1.030)
pH: 6 (ref 5.0–8.0)

## 2013-04-10 NOTE — Assessment & Plan Note (Addendum)
Needs referral to Urology ultimately hytrin not working and with titration of dose has SE of orthostatic hypotension Consider other agents i.e Flomax  DRE prostate felt smooth w/o any nodules  PSA normal  Scrotal exam normal

## 2013-04-10 NOTE — Assessment & Plan Note (Signed)
Needs referral to ENT in the future

## 2013-04-10 NOTE — Assessment & Plan Note (Signed)
Repeated UA today and was negative.  No evidence of blood in any UA so far.  Ultimately will need referral to Urology again who at this time rec.  CT of upper GU tract Will reassess in the future but will benefit from Urology referral for BPH sx's
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
## 2013-04-10 NOTE — Progress Notes (Signed)
Case discussed with Dr. McLean at the time of the visit.  We reviewed the resident's history and exam and pertinent patient test results.  I agree with the assessment, diagnosis, and plan of care documented in the resident's note.     
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
## 2013-04-16 ENCOUNTER — Ambulatory Visit (HOSPITAL_COMMUNITY)
Admission: RE | Admit: 2013-04-16 | Discharge: 2013-04-16 | Disposition: A | Discharge status: Home / Self Care | Payer: No Typology Code available for payment source | Source: Ambulatory Visit | Attending: Internal Medicine | Admitting: Internal Medicine

## 2013-04-16 DIAGNOSIS — R319 Hematuria, unspecified: Secondary | ICD-10-CM

## 2013-04-16 DIAGNOSIS — M25519 Pain in unspecified shoulder: Secondary | ICD-10-CM | POA: Insufficient documentation

## 2013-04-18 ENCOUNTER — Ambulatory Visit (INDEPENDENT_AMBULATORY_CARE_PROVIDER_SITE_OTHER): Payer: No Typology Code available for payment source | Admitting: Internal Medicine

## 2013-04-18 ENCOUNTER — Encounter: Payer: Self-pay | Admitting: Internal Medicine

## 2013-04-18 VITALS — BP 135/80 | HR 83 | Temp 97.7°F | Ht 70.0 in | Wt 149.1 lb

## 2013-04-18 DIAGNOSIS — Z598 Other problems related to housing and economic circumstances: Secondary | ICD-10-CM

## 2013-04-18 DIAGNOSIS — M25512 Pain in left shoulder: Secondary | ICD-10-CM

## 2013-04-18 DIAGNOSIS — H9319 Tinnitus, unspecified ear: Secondary | ICD-10-CM

## 2013-04-18 DIAGNOSIS — R319 Hematuria, unspecified: Secondary | ICD-10-CM

## 2013-04-18 DIAGNOSIS — M25519 Pain in unspecified shoulder: Secondary | ICD-10-CM

## 2013-04-18 DIAGNOSIS — Z0289 Encounter for other administrative examinations: Secondary | ICD-10-CM

## 2013-04-18 DIAGNOSIS — IMO0001 Reserved for inherently not codable concepts without codable children: Secondary | ICD-10-CM

## 2013-04-18 DIAGNOSIS — Z79899 Other long term (current) drug therapy: Secondary | ICD-10-CM

## 2013-04-18 DIAGNOSIS — N400 Enlarged prostate without lower urinary tract symptoms: Secondary | ICD-10-CM

## 2013-04-18 DIAGNOSIS — S42002D Fracture of unspecified part of left clavicle, subsequent encounter for fracture with routine healing: Secondary | ICD-10-CM

## 2013-04-18 DIAGNOSIS — H9313 Tinnitus, bilateral: Secondary | ICD-10-CM

## 2013-04-18 MED ORDER — OXYCODONE HCL ER 10 MG PO T12A: 10.0000 mg | EXTENDED_RELEASE_TABLET | Freq: Two times a day (BID) | ORAL | Status: DC

## 2013-04-18 MED ORDER — FINASTERIDE 5 MG PO TABS: 5.0000 mg | ORAL_TABLET | Freq: Every day | ORAL | Status: DC

## 2013-04-18 MED ORDER — OXYCODONE HCL 5 MG PO TABS: 5.0000 mg | ORAL_TABLET | Freq: Three times a day (TID) | ORAL | Status: DC | PRN

## 2013-04-18 MED ORDER — DOXAZOSIN MESYLATE 4 MG PO TABS: 4.0000 mg | ORAL_TABLET | Freq: Every day | ORAL | Status: DC

## 2013-04-18 NOTE — Patient Instructions (Addendum)
General Instructions: Follow up with [facility] 04/30/13 with Ike Bene. You need to bring $150  Follow up in 1 month in clinic.  Start taking Doxazodin 4 mg and Finesteride 5 mg daily for your prostate  Remember we have the right to deny pain medication refills if your urine is dirty for drugs     Treatment Goals:  Goals (1 Years of Data) as of 04/18/13         04/18/13 04/09/13 03/19/13 03/19/13 03/19/13     Blood Pressure    . Blood Pressure < 140/90  135/80 109/65 133/80 128/78 128/78     Lifestyle    . Quit smoking / using tobacco           Result Component    . LDL CALC < 130            Progress Toward Treatment Goals:  Treatment Goal 04/18/2013  Stop smoking unable to assess  Prevent falls unable to assess    Self Care Goals & Plans:  Self Care Goal 04/18/2013  Manage my medications take my medicines as prescribed; bring my medications to every visit; refill my medications on time  Eat healthy foods drink diet soda or water instead of juice or soda; eat more vegetables; eat foods that are low in salt; eat baked foods instead of fried foods  Be physically active find an activity I enjoy  Stop smoking call [facility] (1-800-QUIT-NOW)  Meeting treatment goals maintain the current self-care plan       Care Management & Community Referrals:  Referral 04/18/2013  Referrals made for care management support social worker  Referrals made to community resources none       Shoulder Pain The shoulder is the joint that connects your arms to your body. The bones that form the shoulder joint include the upper arm bone (humerus), the shoulder blade (scapula), and the collarbone (clavicle). The top of the humerus is shaped like a ball and fits into a rather flat socket on the scapula (glenoid cavity). A combination of muscles and strong, fibrous tissues that connect muscles to bones (tendons) support your shoulder joint and hold the ball in the socket.  Small, fluid-filled sacs (bursae) are located in different areas of the joint. They act as cushions between the bones and the overlying soft tissues and help reduce friction between the gliding tendons and the bone as you move your arm. Your shoulder joint allows a wide range of motion in your arm. This range of motion allows you to do things like scratch your back or throw a ball. However, this range of motion also makes your shoulder more prone to pain from overuse and injury. Causes of shoulder pain can originate from both injury and overuse and usually can be grouped in the following four categories:  Redness, swelling, and pain (inflammation) of the tendon (tendinitis) or the bursae (bursitis).  Instability, such as a dislocation of the joint.  Inflammation of the joint (arthritis).  Broken bone (fracture). HOME CARE INSTRUCTIONS   Apply ice to the sore area.  Put ice in a plastic bag.  Place a towel between your skin and the bag.  Leave the ice on for 15-20 minutes, 3-4 times per day for the first 2 days.  Stop using cold packs if they do not help with the pain.  If you have a shoulder sling or immobilizer, wear it as long as your caregiver instructs. Only remove it to shower or  bathe. Move your arm as little as possible, but keep your hand moving to prevent swelling.  Squeeze a soft ball or foam pad as much as possible to help prevent swelling.  Only take over-the-counter or prescription medicines for pain, discomfort, or fever as directed by your caregiver. SEEK MEDICAL CARE IF:   Your shoulder pain increases, or new pain develops in your arm, hand, or fingers.  Your hand or fingers become cold and numb.  Your pain is not relieved with medicines. SEEK IMMEDIATE MEDICAL CARE IF:   Your arm, hand, or fingers are numb or tingling.  Your arm, hand, or fingers are significantly swollen or turn white or blue. MAKE SURE YOU:   Understand these instructions.  Will watch  your condition.  Will get help right away if you are not doing well or get worse. Document Released: 06/28/2005 Document Revised: 06/12/2012 Document Reviewed: 09/02/2011 [facility] Patient Information 2014 [place], [place].

## 2013-04-19 LAB — PRESCRIPTION ABUSE MONITORING 15P, URINE
Amphetamine/Meth: NEGATIVE ng/mL
Barbiturate Screen, Urine: NEGATIVE ng/mL
Cannabinoid Scrn, Ur: NEGATIVE ng/mL
Carisoprodol, Urine: NEGATIVE ng/mL
Cocaine Metabolites: NEGATIVE ng/mL
Creatinine, Urine: 285.44 mg/dL (ref >20.0)
Fentanyl, Ur: NEGATIVE ng/mL
Meperidine, Ur: NEGATIVE ng/mL
Zolpidem, Urine: NEGATIVE ng/mL

## 2013-04-20 ENCOUNTER — Encounter: Payer: Self-pay | Admitting: Internal Medicine

## 2013-04-20 NOTE — Assessment & Plan Note (Addendum)
No hematuria on any UA ever done though patient reported at 04/09/13 visit
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
## 2013-04-20 NOTE — Addendum Note (Signed)
Addended by: Annett Gula on: 04/20/2013 11:13 PM   Modules accepted: Orders

## 2013-04-20 NOTE — Assessment & Plan Note (Signed)
Patient referred to ENT 7/18.  No appt as of yet

## 2013-04-20 NOTE — Assessment & Plan Note (Signed)
Will refer to [name] for resources

## 2013-04-20 NOTE — Assessment & Plan Note (Signed)
Called orthopedist Dr. Carola Frost who will not see the patient any longer.  Referred to [facility] 04/30/13. Patient to bring $150  Patient still reports he is in pain.  Resigned pain contract 7/18 and did UDS. Patient has h/o substance abuse and informed again about pain contract stipulations/rules.

## 2013-04-20 NOTE — Assessment & Plan Note (Addendum)
Sx's not controlled.  D/c Hytrin and switch to Cardura 4 mg qd and Finesteride 5 mg qd.  Per up to date the combination improves sx's and prevents progression of disease.    Given patient good Rx coupons  If not improving will need referral to urology in the future

## 2013-04-21 ENCOUNTER — Encounter: Payer: Self-pay | Admitting: Internal Medicine

## 2013-04-21 ENCOUNTER — Telehealth: Payer: Self-pay | Admitting: Internal Medicine

## 2013-04-21 ENCOUNTER — Telehealth: Payer: Self-pay | Admitting: Licensed Clinical Social Worker

## 2013-04-21 NOTE — Telephone Encounter (Signed)
[name] was referred to CSW for community financial resources.  Pt also place a Designer, industrial/product.  CSW responded to [employer].  Please see note/email sent: 04/21/2013, Referred to [facility].  CSW will sign off at this time. Pt has CSW contact information and notify when CSW is available.

## 2013-04-21 NOTE — Progress Notes (Signed)
Case discussed with Dr. McLean soon after the resident saw the patient.  We reviewed the resident's history and exam and pertinent patient test results.  I agree with the assessment, diagnosis, and plan of care documented in the resident's note. 

## 2013-04-21 NOTE — Telephone Encounter (Signed)
Called and left message that I do not want the patient to drink any alcohol since I believe he has a history of alcohol abuse.  Advised patient 04/30/13 is appt at [facility] for left shoulder and he will not have surgery this is initial assessment and then they will go from there.  He also asked about Hepatitis GI referral and told him that we usu. Do 2 referrals at a time.  I think Dr. Earlene Plater was discussing GI referral with patient we will do this in the future as patient was just referred to ENT and ortho.  Desma Maxim MD

## 2013-04-22 LAB — BENZODIAZEPINES (GC/LC/MS), URINE
Alprazolam (GC/LC/MS), ur confirm: NEGATIVE ng/mL
Estazolam (GC/LC/MS), ur confirm: NEGATIVE ng/mL
Flunitrazepam metabolite (GC/LC/MS), ur confirm: NEGATIVE ng/mL
Flurazepam metabolite (GC/LC/MS), ur confirm: NEGATIVE ng/mL
Midazolam (GC/LC/MS), ur confirm: NEGATIVE ng/mL

## 2013-04-22 LAB — OPIATES/OPIOIDS (LC/MS-MS)
Heroin (6-AM), UR: NEGATIVE ng/mL
Hydrocodone: NEGATIVE ng/mL
Hydromorphone: NEGATIVE ng/mL
Morphine Urine: NEGATIVE ng/mL
Noroxycodone, Ur: 4827 ng/mL
Oxymorphone: 1653 ng/mL

## 2013-04-22 LAB — OXYCODONE, URINE (LC/MS-MS): Noroxycodone, Ur: 4827 ng/mL

## 2013-05-15 ENCOUNTER — Ambulatory Visit (INDEPENDENT_AMBULATORY_CARE_PROVIDER_SITE_OTHER): Payer: No Typology Code available for payment source | Admitting: Internal Medicine

## 2013-05-15 ENCOUNTER — Encounter: Payer: Self-pay | Admitting: Internal Medicine

## 2013-05-15 VITALS — BP 112/69 | HR 67 | Temp 97.6°F | Ht 70.0 in | Wt 146.4 lb

## 2013-05-15 DIAGNOSIS — F411 Generalized anxiety disorder: Secondary | ICD-10-CM

## 2013-05-15 DIAGNOSIS — H9319 Tinnitus, unspecified ear: Secondary | ICD-10-CM

## 2013-05-15 DIAGNOSIS — M25512 Pain in left shoulder: Secondary | ICD-10-CM

## 2013-05-15 DIAGNOSIS — G4700 Insomnia, unspecified: Secondary | ICD-10-CM

## 2013-05-15 DIAGNOSIS — N400 Enlarged prostate without lower urinary tract symptoms: Secondary | ICD-10-CM

## 2013-05-15 DIAGNOSIS — H9313 Tinnitus, bilateral: Secondary | ICD-10-CM

## 2013-05-15 DIAGNOSIS — B1920 Unspecified viral hepatitis C without hepatic coma: Secondary | ICD-10-CM

## 2013-05-15 DIAGNOSIS — S42002D Fracture of unspecified part of left clavicle, subsequent encounter for fracture with routine healing: Secondary | ICD-10-CM

## 2013-05-15 DIAGNOSIS — IMO0001 Reserved for inherently not codable concepts without codable children: Secondary | ICD-10-CM

## 2013-05-15 DIAGNOSIS — M25519 Pain in unspecified shoulder: Secondary | ICD-10-CM

## 2013-05-15 MED ORDER — OXYCODONE HCL ER 10 MG PO T12A: 10.0000 mg | EXTENDED_RELEASE_TABLET | Freq: Two times a day (BID) | ORAL | Status: DC

## 2013-05-15 MED ORDER — OXYCODONE HCL 5 MG PO TABS: 5.0000 mg | ORAL_TABLET | Freq: Three times a day (TID) | ORAL | Status: DC | PRN

## 2013-05-15 NOTE — Patient Instructions (Addendum)
Follow up in 3-6 months sooner if needed.   Chronic Pain Chronic pain can be defined as pain that is lasting, off and on, and lasts for 3 to 6 months or longer. Many things cause chronic pain, which can make it difficult to make a discrete diagnosis. There are many treatment options available for chronic pain. However, finding a treatment that works well for you may require trying various approaches until a suitable one is found. CAUSES  In some types of chronic medical conditions, the pain is caused by a normal pain response within the body. A normal pain response helps the body identify illness or injury and prevent further damage from being done. In these cases, the cause of the pain may be identified and treated, even if it may not be cured completely. Examples of chronic conditions which can cause chronic pain include:  Inflammation of the joints (arthritis).  Back pain or neck pain (including bulging or herniated disks).  Migraine headaches.  Cancer. In some other types of chronic pain syndromes, the pain is caused by an abnormal pain response within the body. An abnormal pain response is present when there is no ongoing cause (or stimulus) for the pain, or when the cause of the pain is arising from the nerves or nervous system itself. Examples of conditions which can cause chronic pain due to an abnormal pain response include:  Fibromyalgia.  Reflex sympathetic dystrophy (RSD).  Neuropathy (when the nerves themselves are damaged, and may cause pain). DIAGNOSIS  Your caregiver will help diagnose your condition over time. In many cases, the initial focus will be on excluding conditions that could be causing the pain. Depending on your symptoms, your caregiver may order some tests to diagnose your condition. Some of these tests include:  Blood tests.  Computerized X-ray scans (CT scan).  Computerized magnetic scans (MRI).  X-rays.  Ultrasounds.  Nerve conduction  studies.  Consultation with other physicians or specialists. TREATMENT  There are many treatment options for people suffering from chronic pain. Finding a treatment that works well may take time.   You may be referred to a pain management specialist.  You may be put on medication to help with the pain. Unfortunately, some medications (such as opiate medications) may not be very effective in cases where chronic pain is due to abnormal pain responses. Finding the right medications can take some time.  Adjunctive therapies may be used to provide additional relief and improve a patient's quality of life. These therapies include:  Mindfulness meditation.  Acupuncture.  Biofeedback.  Cognitive-behavioral therapy.  In certain cases, surgical interventions may be attempted. HOME CARE INSTRUCTIONS   Make sure you understand these instructions prior to discharge.  Ask any questions and share any further concerns you have with your caregiver prior to discharge.  Take all medications as directed by your caregiver.  Keep all follow-up appointments. SEEK MEDICAL CARE IF:   Your pain gets worse.  You develop a new pain that was not present before.  You cannot tolerate any medications prescribed by your caregiver.  You develop new symptoms since your last visit with your caregiver. SEEK IMMEDIATE MEDICAL CARE IF:   You develop muscular weakness.  You have decreased sensation or numbness.  You lose control of bowel or bladder function.  Your pain suddenly gets much worse.  You have an oral temperature above 102 F (38.9 C), not controlled by medication.  You develop shaking chills, confusion, chest pain, or shortness of breath. Document Released:  06/10/2002 Document Revised: 12/11/2011 Document Reviewed: 09/16/2008 [employer] Patient Information 2014 [place], [place].  Insomnia Insomnia is frequent trouble falling and/or staying asleep. Insomnia can be a long term problem or a  short term problem. Both are common. Insomnia can be a short term problem when the wakefulness is related to a certain stress or worry. Long term insomnia is often related to ongoing stress during waking hours and/or poor sleeping habits. Overtime, sleep deprivation itself can make the problem worse. Every little thing feels more severe because you are overtired and your ability to cope is decreased. CAUSES   Stress, anxiety, and depression.  Poor sleeping habits.  Distractions such as TV in the bedroom.  Naps close to bedtime.  Engaging in emotionally charged conversations before bed.  Technical reading before sleep.  Alcohol and other sedatives. They may make the problem worse. They can hurt normal sleep patterns and normal dream activity.  Stimulants such as caffeine for several hours prior to bedtime.  Pain syndromes and shortness of breath can cause insomnia.  Exercise late at night.  Changing time zones may cause sleeping problems (jet lag). It is sometimes helpful to have someone observe your sleeping patterns. They should look for periods of not breathing during the night (sleep apnea). They should also look to see how long those periods last. If you live alone or observers are uncertain, you can also be observed at a sleep clinic where your sleep patterns will be professionally monitored. Sleep apnea requires a checkup and treatment. Give your caregivers your medical history. Give your caregivers observations your family has made about your sleep.  SYMPTOMS   Not feeling rested in the morning.  Anxiety and restlessness at bedtime.  Difficulty falling and staying asleep. TREATMENT   Your caregiver may prescribe treatment for an underlying medical disorders. Your caregiver can give advice or help if you are using alcohol or other drugs for self-medication. Treatment of underlying problems will usually eliminate insomnia problems.  Medications can be prescribed for short  time use. They are generally not recommended for lengthy use.  Over-the-counter sleep medicines are not recommended for lengthy use. They can be habit forming.  You can promote easier sleeping by making lifestyle changes such as:  Using relaxation techniques that help with breathing and reduce muscle tension.  Exercising earlier in the day.  Changing your diet and the time of your last meal. No night time snacks.  Establish a regular time to go to bed.  Counseling can help with stressful problems and worry.  Soothing music and white noise may be helpful if there are background noises you cannot remove.  Stop tedious detailed work at least one hour before bedtime. HOME CARE INSTRUCTIONS   Keep a diary. Inform your caregiver about your progress. This includes any medication side effects. See your caregiver regularly. Take note of:  Times when you are asleep.  Times when you are awake during the night.  The quality of your sleep.  How you feel the next day. This information will help your caregiver care for you.  Get out of bed if you are still awake after 15 minutes. Read or do some quiet activity. Keep the lights down. Wait until you feel sleepy and go back to bed.  Keep regular sleeping and waking hours. Avoid naps.  Exercise regularly.  Avoid distractions at bedtime. Distractions include watching television or engaging in any intense or detailed activity like attempting to balance the household checkbook.  Develop a bedtime ritual.  Keep a familiar routine of bathing, brushing your teeth, climbing into bed at the same time each night, listening to soothing music. Routines increase the success of falling to sleep faster.  Use relaxation techniques. This can be using breathing and muscle tension release routines. It can also include visualizing peaceful scenes. You can also help control troubling or intruding thoughts by keeping your mind occupied with boring or repetitive  thoughts like the old concept of counting sheep. You can make it more creative like imagining planting one beautiful flower after another in your backyard garden.  During your day, work to eliminate stress. When this is not possible use some of the previous suggestions to help reduce the anxiety that accompanies stressful situations. MAKE SURE YOU:   Understand these instructions.  Will watch your condition.  Will get help right away if you are not doing well or get worse. Document Released: 09/15/2000 Document Revised: 12/11/2011 Document Reviewed: 10/16/2007 [facility] Patient Information 2014 [place], [place].  Anxiety and Panic Attacks Your caregiver has informed you that you are having an anxiety or panic attack. There may be many forms of this. Most of the time these attacks come suddenly and without warning. They come at any time of day, including periods of sleep, and at any time of life. They may be strong and unexplained. Although panic attacks are very scary, they are physically harmless. Sometimes the cause of your anxiety is not known. Anxiety is a protective mechanism of the body in its fight or flight mechanism. Most of these perceived danger situations are actually nonphysical situations (such as anxiety over losing a job). CAUSES  The causes of an anxiety or panic attack are many. Panic attacks may occur in otherwise healthy people given a certain set of circumstances. There may be a genetic cause for panic attacks. Some medications may also have anxiety as a side effect. SYMPTOMS  Some of the most common feelings are:  Intense terror.  Dizziness, feeling faint.  Hot and cold flashes.  Fear of going crazy.  Feelings that nothing is real.  Sweating.  Shaking.  Chest pain or a fast heartbeat (palpitations).  Smothering, choking sensations.  Feelings of impending doom and that death is near.  Tingling of extremities, this may be from over-breathing.  Altered  reality (derealization).  Being detached from yourself (depersonalization). Several symptoms can be present to make up anxiety or panic attacks. DIAGNOSIS  The evaluation by your caregiver will depend on the type of symptoms you are experiencing. The diagnosis of anxiety or panic attack is made when no physical illness can be determined to be a cause of the symptoms. TREATMENT  Treatment to prevent anxiety and panic attacks may include:  Avoidance of circumstances that cause anxiety.  Reassurance and relaxation.  Regular exercise.  Relaxation therapies, such as yoga.  Psychotherapy with a psychiatrist or therapist.  Avoidance of caffeine, alcohol and illegal drugs.  Prescribed medication. SEEK IMMEDIATE MEDICAL CARE IF:   You experience panic attack symptoms that are different than your usual symptoms.  You have any worsening or concerning symptoms. Document Released: 09/18/2005 Document Revised: 12/11/2011 Document Reviewed: 01/20/2010 [facility] Patient Information 2014 [place], [place].

## 2013-05-15 NOTE — Assessment & Plan Note (Signed)
In future a provider in the clinic will need to f/u on referral to hepatitis specialist

## 2013-05-15 NOTE — Assessment & Plan Note (Signed)
Pending appt to Ortho at [facility] 05/19/13  Rx refill Oxycontin 10 q12 prn #56 not to be filled b/f 8/31  Rx refill Oxycodone 5 mg q 8 hours prn #8 not to be filled before 9/8

## 2013-05-15 NOTE — Assessment & Plan Note (Signed)
Pending ENT referral which appt may be scheduled 06/2013

## 2013-05-15 NOTE — Assessment & Plan Note (Signed)
Etiology may be related to alcohol abuse, social stressors (i.e lack of finances) tsh wnl 12/2012 Given information on anxiety  Encouraged alcohol cessation if needs help seek alcohol cessation counseling

## 2013-05-15 NOTE — Assessment & Plan Note (Signed)
May be secondary to substance i.e alcohol as pt is drinking 3-40oz beers daily Discussed sleep hygiene Given information to read Encouraged alcohol cessation

## 2013-05-15 NOTE — Assessment & Plan Note (Signed)
Symptoms improved after taking Cardura and Proscar
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
## 2013-05-16 NOTE — Progress Notes (Signed)
Case discussed with Dr. McLean soon after the resident saw the patient.  We reviewed the resident's history and exam and pertinent patient test results.  I agree with the assessment, diagnosis, and plan of care documented in the resident's note. 

## 2013-05-19 DIAGNOSIS — S46009A Unspecified injury of muscle(s) and tendon(s) of the rotator cuff of unspecified shoulder, initial encounter: Secondary | ICD-10-CM | POA: Insufficient documentation

## 2013-05-25 ENCOUNTER — Emergency Department (HOSPITAL_COMMUNITY): Payer: No Typology Code available for payment source

## 2013-05-25 ENCOUNTER — Inpatient Hospital Stay (HOSPITAL_COMMUNITY)
Admission: EM | Admit: 2013-05-25 | Discharge: 2013-05-28 | DRG: 439 | Disposition: A | Discharge status: Home / Self Care | Payer: No Typology Code available for payment source | Attending: Internal Medicine | Admitting: Internal Medicine

## 2013-05-25 ENCOUNTER — Encounter (HOSPITAL_COMMUNITY): Payer: Self-pay | Admitting: *Deleted

## 2013-05-25 DIAGNOSIS — E039 Hypothyroidism, unspecified: Secondary | ICD-10-CM | POA: Diagnosis present

## 2013-05-25 DIAGNOSIS — R7402 Elevation of levels of lactic acid dehydrogenase (LDH): Secondary | ICD-10-CM | POA: Diagnosis present

## 2013-05-25 DIAGNOSIS — Z91199 Patient's noncompliance with other medical treatment and regimen due to unspecified reason: Secondary | ICD-10-CM

## 2013-05-25 DIAGNOSIS — E871 Hypo-osmolality and hyponatremia: Secondary | ICD-10-CM | POA: Diagnosis present

## 2013-05-25 DIAGNOSIS — K859 Acute pancreatitis without necrosis or infection, unspecified: Principal | ICD-10-CM

## 2013-05-25 DIAGNOSIS — F17200 Nicotine dependence, unspecified, uncomplicated: Secondary | ICD-10-CM

## 2013-05-25 DIAGNOSIS — E872 Acidosis, unspecified: Secondary | ICD-10-CM | POA: Diagnosis present

## 2013-05-25 DIAGNOSIS — R609 Edema, unspecified: Secondary | ICD-10-CM | POA: Diagnosis present

## 2013-05-25 DIAGNOSIS — F411 Generalized anxiety disorder: Secondary | ICD-10-CM

## 2013-05-25 DIAGNOSIS — Z598 Other problems related to housing and economic circumstances: Secondary | ICD-10-CM

## 2013-05-25 DIAGNOSIS — I2510 Atherosclerotic heart disease of native coronary artery without angina pectoris: Secondary | ICD-10-CM | POA: Diagnosis present

## 2013-05-25 DIAGNOSIS — Z5987 Material hardship due to limited financial resources, not elsewhere classified: Secondary | ICD-10-CM

## 2013-05-25 DIAGNOSIS — E0590 Thyrotoxicosis, unspecified without thyrotoxic crisis or storm: Secondary | ICD-10-CM

## 2013-05-25 DIAGNOSIS — K5900 Constipation, unspecified: Secondary | ICD-10-CM | POA: Diagnosis present

## 2013-05-25 DIAGNOSIS — I252 Old myocardial infarction: Secondary | ICD-10-CM

## 2013-05-25 DIAGNOSIS — G8929 Other chronic pain: Secondary | ICD-10-CM | POA: Diagnosis present

## 2013-05-25 DIAGNOSIS — K861 Other chronic pancreatitis: Secondary | ICD-10-CM | POA: Diagnosis present

## 2013-05-25 DIAGNOSIS — R7401 Elevation of levels of liver transaminase levels: Secondary | ICD-10-CM | POA: Diagnosis present

## 2013-05-25 DIAGNOSIS — M79609 Pain in unspecified limb: Secondary | ICD-10-CM | POA: Diagnosis present

## 2013-05-25 DIAGNOSIS — H9313 Tinnitus, bilateral: Secondary | ICD-10-CM

## 2013-05-25 DIAGNOSIS — N400 Enlarged prostate without lower urinary tract symptoms: Secondary | ICD-10-CM

## 2013-05-25 DIAGNOSIS — B1920 Unspecified viral hepatitis C without hepatic coma: Secondary | ICD-10-CM

## 2013-05-25 DIAGNOSIS — Z9119 Patient's noncompliance with other medical treatment and regimen: Secondary | ICD-10-CM

## 2013-05-25 DIAGNOSIS — Z599 Problem related to housing and economic circumstances, unspecified: Secondary | ICD-10-CM

## 2013-05-25 DIAGNOSIS — E7800 Pure hypercholesterolemia, unspecified: Secondary | ICD-10-CM | POA: Diagnosis present

## 2013-05-25 DIAGNOSIS — G894 Chronic pain syndrome: Secondary | ICD-10-CM | POA: Diagnosis present

## 2013-05-25 DIAGNOSIS — M129 Arthropathy, unspecified: Secondary | ICD-10-CM | POA: Diagnosis present

## 2013-05-25 DIAGNOSIS — Z5900 Homelessness unspecified: Secondary | ICD-10-CM

## 2013-05-25 DIAGNOSIS — E440 Moderate protein-calorie malnutrition: Secondary | ICD-10-CM | POA: Diagnosis present

## 2013-05-25 DIAGNOSIS — F1010 Alcohol abuse, uncomplicated: Secondary | ICD-10-CM | POA: Diagnosis present

## 2013-05-25 DIAGNOSIS — H9319 Tinnitus, unspecified ear: Secondary | ICD-10-CM

## 2013-05-25 LAB — URINALYSIS, ROUTINE W REFLEX MICROSCOPIC
Glucose, UA: NEGATIVE mg/dL
Ketones, ur: 40 mg/dL — AB
Leukocytes, UA: NEGATIVE
Protein, ur: 100 mg/dL — AB
Urobilinogen, UA: 1 mg/dL (ref 0.0–1.0)

## 2013-05-25 LAB — COMPREHENSIVE METABOLIC PANEL
AST: 147 U/L — ABNORMAL HIGH (ref 0–37)
BUN: 27 mg/dL — ABNORMAL HIGH (ref 6–23)
CO2: 18 mEq/L — ABNORMAL LOW (ref 19–32)
Calcium: 9.7 mg/dL (ref 8.4–10.5)
Chloride: 93 mEq/L — ABNORMAL LOW (ref 96–112)
Creatinine, Ser: 0.83 mg/dL (ref 0.50–1.35)
GFR calc Af Amer: >90 mL/min (ref >90)
GFR calc non Af Amer: >90 mL/min (ref >90)
Glucose, Bld: 138 mg/dL — ABNORMAL HIGH (ref 70–99)
Total Bilirubin: 1.1 mg/dL (ref 0.3–1.2)

## 2013-05-25 LAB — CBC WITH DIFFERENTIAL/PLATELET
Basophils Absolute: 0 10*3/uL (ref 0.0–0.1)
Eosinophils Relative: 0 % (ref 0–5)
HCT: 44.9 % (ref 39.0–52.0)
Hemoglobin: 16.5 g/dL (ref 13.0–17.0)
Lymphocytes Relative: 23 % (ref 12–46)
Lymphs Abs: 1.3 10*3/uL (ref 0.7–4.0)
MCV: 92.6 fL (ref 78.0–100.0)
Monocytes Absolute: 1.2 10*3/uL — ABNORMAL HIGH (ref 0.1–1.0)
Monocytes Relative: 20 % — ABNORMAL HIGH (ref 3–12)
Neutro Abs: 3.4 10*3/uL (ref 1.7–7.7)
RDW: 13.9 % (ref 11.5–15.5)
WBC: 5.9 10*3/uL (ref 4.0–10.5)

## 2013-05-25 LAB — LIPASE, BLOOD: Lipase: 111 U/L — ABNORMAL HIGH (ref 11–59)

## 2013-05-25 LAB — TROPONIN I: Troponin I: <0.3 ng/mL (ref <0.30)

## 2013-05-25 LAB — URINE MICROSCOPIC-ADD ON

## 2013-05-25 MED ORDER — HYDROMORPHONE HCL PF 1 MG/ML IJ SOLN
1.0000 mg | Freq: Once | INTRAMUSCULAR | Status: AC
  Administered 2013-05-25: 1 mg via INTRAVENOUS
  Filled 2013-05-25: qty 1

## 2013-05-25 MED ORDER — FOLIC ACID 5 MG/ML IJ SOLN
1.0000 mg | Freq: Every day | INTRAMUSCULAR | Status: DC
  Administered 2013-05-26: 1 mg via INTRAVENOUS
  Filled 2013-05-25 (×3): qty 0.2

## 2013-05-25 MED ORDER — ONDANSETRON HCL 4 MG/2ML IJ SOLN
4.0000 mg | Freq: Once | INTRAMUSCULAR | Status: AC
  Administered 2013-05-25: 4 mg via INTRAVENOUS
  Filled 2013-05-25: qty 2

## 2013-05-25 MED ORDER — SODIUM CHLORIDE 0.9 % IV SOLN
INTRAVENOUS | Status: DC
  Administered 2013-05-26 – 2013-05-27 (×4): via INTRAVENOUS

## 2013-05-25 MED ORDER — THIAMINE HCL 100 MG/ML IJ SOLN
100.0000 mg | Freq: Every day | INTRAMUSCULAR | Status: DC
  Filled 2013-05-25 (×3): qty 1

## 2013-05-25 MED ORDER — ADULT MULTIVITAMIN W/MINERALS CH
1.0000 | ORAL_TABLET | Freq: Every day | ORAL | Status: DC
  Administered 2013-05-26 – 2013-05-28 (×3): 1 via ORAL
  Filled 2013-05-25 (×3): qty 1

## 2013-05-25 MED ORDER — VITAMIN B-1 100 MG PO TABS
100.0000 mg | ORAL_TABLET | Freq: Every day | ORAL | Status: DC
  Administered 2013-05-26 – 2013-05-28 (×3): 100 mg via ORAL
  Filled 2013-05-25 (×3): qty 1

## 2013-05-25 MED ORDER — FOLIC ACID 1 MG PO TABS
1.0000 mg | ORAL_TABLET | Freq: Every day | ORAL | Status: DC
  Administered 2013-05-26 – 2013-05-28 (×3): 1 mg via ORAL
  Filled 2013-05-25 (×3): qty 1

## 2013-05-25 MED ORDER — SODIUM CHLORIDE 0.9 % IV BOLUS (SEPSIS)
1000.0000 mL | Freq: Once | INTRAVENOUS | Status: AC
  Administered 2013-05-25: 1000 mL via INTRAVENOUS

## 2013-05-25 MED ORDER — HEPARIN SODIUM (PORCINE) 5000 UNIT/ML IJ SOLN
5000.0000 [IU] | Freq: Three times a day (TID) | INTRAMUSCULAR | Status: DC
  Administered 2013-05-26 – 2013-05-28 (×7): 5000 [IU] via SUBCUTANEOUS
  Filled 2013-05-25 (×10): qty 1

## 2013-05-25 MED ORDER — ONDANSETRON HCL 4 MG/2ML IJ SOLN
4.0000 mg | Freq: Four times a day (QID) | INTRAMUSCULAR | Status: DC | PRN
  Administered 2013-05-26 – 2013-05-27 (×2): 4 mg via INTRAVENOUS
  Filled 2013-05-25 (×2): qty 2

## 2013-05-25 MED ORDER — LORAZEPAM 2 MG/ML IJ SOLN: 0.0000 mg | Freq: Two times a day (BID) | INTRAMUSCULAR | Status: DC

## 2013-05-25 MED ORDER — HYDROMORPHONE HCL PF 1 MG/ML IJ SOLN
1.0000 mg | INTRAMUSCULAR | Status: DC | PRN
  Administered 2013-05-26 – 2013-05-27 (×10): 1 mg via INTRAVENOUS
  Filled 2013-05-25 (×10): qty 1

## 2013-05-25 MED ORDER — THIAMINE HCL 100 MG/ML IJ SOLN
100.0000 mg | Freq: Every day | INTRAMUSCULAR | Status: DC
  Administered 2013-05-26: 100 mg via INTRAVENOUS
  Filled 2013-05-25 (×2): qty 1

## 2013-05-25 MED ORDER — PANTOPRAZOLE SODIUM 40 MG IV SOLR
40.0000 mg | Freq: Every day | INTRAVENOUS | Status: DC
  Administered 2013-05-26 (×2): 40 mg via INTRAVENOUS
  Filled 2013-05-25 (×3): qty 40

## 2013-05-25 MED ORDER — LORAZEPAM 2 MG/ML IJ SOLN
1.0000 mg | Freq: Four times a day (QID) | INTRAMUSCULAR | Status: DC | PRN
  Administered 2013-05-26: 1 mg via INTRAVENOUS
  Filled 2013-05-25: qty 1

## 2013-05-25 MED ORDER — LORAZEPAM 1 MG PO TABS
1.0000 mg | ORAL_TABLET | Freq: Four times a day (QID) | ORAL | Status: DC | PRN
  Administered 2013-05-27 – 2013-05-28 (×3): 1 mg via ORAL
  Filled 2013-05-25 (×4): qty 1

## 2013-05-25 MED ORDER — ONDANSETRON HCL 4 MG PO TABS: 4.0000 mg | ORAL_TABLET | Freq: Four times a day (QID) | ORAL | Status: DC | PRN

## 2013-05-25 MED ORDER — LORAZEPAM 2 MG/ML IJ SOLN: 0.0000 mg | Freq: Four times a day (QID) | INTRAMUSCULAR | Status: AC

## 2013-05-25 MED ORDER — SODIUM CHLORIDE 0.9 % IJ SOLN
3.0000 mL | Freq: Two times a day (BID) | INTRAMUSCULAR | Status: DC
  Administered 2013-05-27 – 2013-05-28 (×2): 3 mL via INTRAVENOUS

## 2013-05-25 NOTE — ED Notes (Signed)
Urinal at bedside; pt informed of need for urine sample
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
## 2013-05-25 NOTE — ED Notes (Signed)
Pt has chronic pancreatitis and here with abdominal pain and vomiting.  Left shoulder pain that is to be operated on at [facility]

## 2013-05-25 NOTE — ED Notes (Signed)
MD at bedside. 
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
## 2013-05-26 DIAGNOSIS — K859 Acute pancreatitis without necrosis or infection, unspecified: Secondary | ICD-10-CM | POA: Diagnosis present

## 2013-05-26 DIAGNOSIS — F17200 Nicotine dependence, unspecified, uncomplicated: Secondary | ICD-10-CM

## 2013-05-26 DIAGNOSIS — R109 Unspecified abdominal pain: Secondary | ICD-10-CM

## 2013-05-26 DIAGNOSIS — E440 Moderate protein-calorie malnutrition: Secondary | ICD-10-CM | POA: Diagnosis present

## 2013-05-26 DIAGNOSIS — F411 Generalized anxiety disorder: Secondary | ICD-10-CM

## 2013-05-26 DIAGNOSIS — M79609 Pain in unspecified limb: Secondary | ICD-10-CM

## 2013-05-26 DIAGNOSIS — G8929 Other chronic pain: Secondary | ICD-10-CM

## 2013-05-26 DIAGNOSIS — Z598 Other problems related to housing and economic circumstances: Secondary | ICD-10-CM

## 2013-05-26 LAB — COMPREHENSIVE METABOLIC PANEL
ALT: 141 U/L — ABNORMAL HIGH (ref 0–53)
CO2: 22 mEq/L (ref 19–32)
Calcium: 8.5 mg/dL (ref 8.4–10.5)
Chloride: 101 mEq/L (ref 96–112)
Creatinine, Ser: 0.74 mg/dL (ref 0.50–1.35)
GFR calc Af Amer: >90 mL/min (ref >90)
GFR calc non Af Amer: >90 mL/min (ref >90)
Glucose, Bld: 110 mg/dL — ABNORMAL HIGH (ref 70–99)
Total Bilirubin: 1 mg/dL (ref 0.3–1.2)

## 2013-05-26 LAB — RAPID URINE DRUG SCREEN, HOSP PERFORMED
Barbiturates: NOT DETECTED
Cocaine: NOT DETECTED
Tetrahydrocannabinol: POSITIVE — AB

## 2013-05-26 LAB — CBC
Hemoglobin: 13.6 g/dL (ref 13.0–17.0)
MCH: 32.9 pg (ref 26.0–34.0)
MCHC: 34.9 g/dL (ref 30.0–36.0)
Platelets: 219 10*3/uL (ref 150–400)
RBC: 4.13 MIL/uL — ABNORMAL LOW (ref 4.22–5.81)

## 2013-05-26 LAB — HEMOGLOBIN A1C: Hgb A1c MFr Bld: 5.6 % (ref <5.7)

## 2013-05-26 LAB — LIPID PANEL
Cholesterol: 226 mg/dL — ABNORMAL HIGH (ref 0–200)
Triglycerides: 119 mg/dL (ref <150)
VLDL: 24 mg/dL (ref 0–40)

## 2013-05-26 LAB — MAGNESIUM: Magnesium: 2.5 mg/dL (ref 1.5–2.5)

## 2013-05-26 LAB — TROPONIN I: Troponin I: <0.3 ng/mL (ref <0.30)

## 2013-05-26 MED ORDER — DOCUSATE SODIUM 100 MG PO CAPS
100.0000 mg | ORAL_CAPSULE | Freq: Two times a day (BID) | ORAL | Status: DC
  Administered 2013-05-26 – 2013-05-28 (×5): 100 mg via ORAL
  Filled 2013-05-26 (×6): qty 1

## 2013-05-26 MED ORDER — ATORVASTATIN CALCIUM 40 MG PO TABS
40.0000 mg | ORAL_TABLET | Freq: Every day | ORAL | Status: DC
  Administered 2013-05-26 – 2013-05-27 (×2): 40 mg via ORAL
  Filled 2013-05-26 (×4): qty 1

## 2013-05-26 MED ORDER — ASPIRIN 81 MG PO CHEW
81.0000 mg | CHEWABLE_TABLET | Freq: Every day | ORAL | Status: DC
  Filled 2013-05-26: qty 1

## 2013-05-26 MED ORDER — MAGNESIUM HYDROXIDE 400 MG/5ML PO SUSP: 30.0000 mL | Freq: Every day | ORAL | Status: DC | PRN
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
## 2013-05-26 NOTE — Progress Notes (Signed)
Clinical Social Work Department BRIEF PSYCHOSOCIAL ASSESSMENT 05/26/2013  Patient:  [name], [name]     Account Number:  [id number]     Admit date:  05/25/2013  Clinical Social Worker:  Carren Rang  Date/Time:  05/26/2013 04:05 PM  Referred by:  Physician  Date Referred:  05/26/2013 Referred for  Homelessness   Other Referral:   Interview type:  Patient Other interview type:    PSYCHOSOCIAL DATA Living Status:  OTHER Admitted from facility:   Level of care:   Primary support name:  Fancies,Kelly Primary support relationship to patient:  FRIEND Degree of support available:   poor    CURRENT CONCERNS Current Concerns  Financial Resources   Other Concerns:   Patient can not afford Boarding House any longer and needs resources    SOCIAL WORK ASSESSMENT / PLAN CSW met with patient about homeless issues. Patient stated he lives in a boarding house that his ex wife helped pay for. Patient states his ex wife was helping pay but is not able to help. Patient states his family can not help him any longer. CSW gave patient resources for shelters and the [facility].   Assessment/plan status:  Information/Referral to [facility] Other assessment/ plan:   Information/referral to community resources:   Homeless shelters, food pantries, [facility]    PATIENT'S/FAMILY'S RESPONSE TO PLAN OF CARE: Patient states his family is not able to help him financially anymore. Patient states he has been to [facility] before and is unsure if he is allowed back.       Maree Krabbe, MSW, Theresia Majors [phone]

## 2013-05-26 NOTE — Progress Notes (Signed)
VASCULAR LAB PRELIMINARY  ARTERIAL  ABI completed:    RIGHT    LEFT    PRESSURE WAVEFORM  PRESSURE WAVEFORM  BRACHIAL 121 Triphasic BRACHIAL 132 Triphasic  DP 151 Triphasic DP 150 Biphasic  AT   AT    PT 157 Triphasic PT 155 Biphasic  PER   PER    GREAT TOE 138 NA GREAT TOE 135 NA    RIGHT LEFT  ABI 1.19 1.17  TBI 1.05 1.02   Bilateral ABIs and toe pressures are within normal limits.  Bilateral toes are cool to touch, therefore slightly diminished waveforms may be due to vasoconstriction, unrelated to arterial insufficiency.   These findings, along with normal toe pressures suggest that arterial insufficiency is unlikely.   05/26/2013 3:57 PM Gertie Fey, RVT, RDCS, RDMS

## 2013-05-27 DIAGNOSIS — M79609 Pain in unspecified limb: Secondary | ICD-10-CM

## 2013-05-27 LAB — BASIC METABOLIC PANEL
CO2: 24 mEq/L (ref 19–32)
Calcium: 8.3 mg/dL — ABNORMAL LOW (ref 8.4–10.5)
Chloride: 102 mEq/L (ref 96–112)
Glucose, Bld: 100 mg/dL — ABNORMAL HIGH (ref 70–99)
Potassium: 3.9 mEq/L (ref 3.5–5.1)
Sodium: 134 mEq/L — ABNORMAL LOW (ref 135–145)

## 2013-05-27 LAB — T4, FREE: Free T4: 0.94 ng/dL (ref 0.80–1.80)

## 2013-05-27 MED ORDER — OXYCODONE HCL ER 10 MG PO T12A
10.0000 mg | EXTENDED_RELEASE_TABLET | Freq: Two times a day (BID) | ORAL | Status: DC
  Administered 2013-05-27 – 2013-05-28 (×3): 10 mg via ORAL
  Filled 2013-05-27 (×3): qty 1

## 2013-05-27 MED ORDER — PANTOPRAZOLE SODIUM 40 MG PO TBEC
40.0000 mg | DELAYED_RELEASE_TABLET | Freq: Every day | ORAL | Status: DC
  Administered 2013-05-27: 40 mg via ORAL
  Filled 2013-05-27: qty 1

## 2013-05-27 MED ORDER — POTASSIUM CHLORIDE CRYS ER 20 MEQ PO TBCR
40.0000 meq | EXTENDED_RELEASE_TABLET | Freq: Once | ORAL | Status: AC
  Administered 2013-05-27: 40 meq via ORAL
  Filled 2013-05-27: qty 2

## 2013-05-27 MED ORDER — OXYCODONE HCL 5 MG PO TABS
5.0000 mg | ORAL_TABLET | Freq: Three times a day (TID) | ORAL | Status: DC | PRN
  Administered 2013-05-27 – 2013-05-28 (×4): 5 mg via ORAL
  Filled 2013-05-27 (×4): qty 1

## 2013-05-27 NOTE — Progress Notes (Signed)
*  PRELIMINARY RESULTS* Vascular Ultrasound Lower extremity venous duplex has been completed.  Preliminary findings: negative for DVT and baker's cyst.    Farrel Demark, RDMS, RVT  05/27/2013, 3:37 PM
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
## 2013-05-27 NOTE — Progress Notes (Signed)
  Date: 05/27/2013  Patient name: [name]  Medical record number: [id number]  Date of birth: 1956-02-22   This patient has been seen and the plan of care was discussed with the house staff. Please see their note for complete details. I concur with their findings.  Inez Catalina, MD 05/27/2013, 8:35 PM

## 2013-05-27 NOTE — Progress Notes (Signed)
Pt states that he felt little to no pain in the epigastric area post full liquid diet meal.Deisy Ozbun R, RN

## 2013-05-28 LAB — BASIC METABOLIC PANEL
CO2: 28 mEq/L (ref 19–32)
Calcium: 9 mg/dL (ref 8.4–10.5)
Glucose, Bld: 109 mg/dL — ABNORMAL HIGH (ref 70–99)
Potassium: 4.2 mEq/L (ref 3.5–5.1)
Sodium: 136 mEq/L (ref 135–145)

## 2013-05-28 LAB — VITAMIN B12: Vitamin B-12: 652 pg/mL (ref 211–911)

## 2013-05-28 LAB — PHOSPHORUS: Phosphorus: 2.4 mg/dL (ref 2.3–4.6)

## 2013-05-28 LAB — MAGNESIUM: Magnesium: 2.2 mg/dL (ref 1.5–2.5)

## 2013-05-28 MED ORDER — OXYCODONE HCL ER 10 MG PO T12A: 10.0000 mg | EXTENDED_RELEASE_TABLET | Freq: Two times a day (BID) | ORAL | Status: DC

## 2013-05-28 MED ORDER — PAROXETINE HCL 20 MG PO TABS: 20.0000 mg | ORAL_TABLET | ORAL | Status: DC

## 2013-05-28 MED ORDER — FINASTERIDE 5 MG PO TABS: 5.0000 mg | ORAL_TABLET | Freq: Every day | ORAL | Status: DC

## 2013-05-28 MED ORDER — LOVASTATIN 40 MG PO TABS: 40.0000 mg | ORAL_TABLET | Freq: Every day | ORAL | Status: DC

## 2013-05-28 MED ORDER — OXYCODONE HCL 5 MG PO TABS: 5.0000 mg | ORAL_TABLET | Freq: Three times a day (TID) | ORAL | Status: DC | PRN
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
## 2013-05-29 ENCOUNTER — Encounter (HOSPITAL_COMMUNITY): Payer: Self-pay | Admitting: Emergency Medicine

## 2013-05-29 ENCOUNTER — Emergency Department (HOSPITAL_COMMUNITY)
Admission: EM | Admit: 2013-05-29 | Discharge: 2013-05-29 | Disposition: A | Discharge status: Home / Self Care | Payer: No Typology Code available for payment source | Attending: Emergency Medicine | Admitting: Emergency Medicine

## 2013-05-29 DIAGNOSIS — R112 Nausea with vomiting, unspecified: Secondary | ICD-10-CM | POA: Insufficient documentation

## 2013-05-29 DIAGNOSIS — F17200 Nicotine dependence, unspecified, uncomplicated: Secondary | ICD-10-CM | POA: Insufficient documentation

## 2013-05-29 DIAGNOSIS — Z8659 Personal history of other mental and behavioral disorders: Secondary | ICD-10-CM | POA: Insufficient documentation

## 2013-05-29 DIAGNOSIS — Z8781 Personal history of (healed) traumatic fracture: Secondary | ICD-10-CM | POA: Insufficient documentation

## 2013-05-29 DIAGNOSIS — Z87448 Personal history of other diseases of urinary system: Secondary | ICD-10-CM | POA: Insufficient documentation

## 2013-05-29 DIAGNOSIS — Z9089 Acquired absence of other organs: Secondary | ICD-10-CM | POA: Insufficient documentation

## 2013-05-29 DIAGNOSIS — Z8709 Personal history of other diseases of the respiratory system: Secondary | ICD-10-CM | POA: Insufficient documentation

## 2013-05-29 DIAGNOSIS — R109 Unspecified abdominal pain: Secondary | ICD-10-CM

## 2013-05-29 DIAGNOSIS — Z8639 Personal history of other endocrine, nutritional and metabolic disease: Secondary | ICD-10-CM | POA: Insufficient documentation

## 2013-05-29 DIAGNOSIS — F1021 Alcohol dependence, in remission: Secondary | ICD-10-CM | POA: Insufficient documentation

## 2013-05-29 DIAGNOSIS — Z862 Personal history of diseases of the blood and blood-forming organs and certain disorders involving the immune mechanism: Secondary | ICD-10-CM | POA: Insufficient documentation

## 2013-05-29 DIAGNOSIS — F411 Generalized anxiety disorder: Secondary | ICD-10-CM | POA: Insufficient documentation

## 2013-05-29 DIAGNOSIS — Z8739 Personal history of other diseases of the musculoskeletal system and connective tissue: Secondary | ICD-10-CM | POA: Insufficient documentation

## 2013-05-29 DIAGNOSIS — G8929 Other chronic pain: Secondary | ICD-10-CM | POA: Insufficient documentation

## 2013-05-29 DIAGNOSIS — Z8719 Personal history of other diseases of the digestive system: Secondary | ICD-10-CM | POA: Insufficient documentation

## 2013-05-29 DIAGNOSIS — R1013 Epigastric pain: Secondary | ICD-10-CM | POA: Insufficient documentation

## 2013-05-29 DIAGNOSIS — Z79899 Other long term (current) drug therapy: Secondary | ICD-10-CM | POA: Insufficient documentation

## 2013-05-29 DIAGNOSIS — R5381 Other malaise: Secondary | ICD-10-CM | POA: Insufficient documentation

## 2013-05-29 DIAGNOSIS — Z8701 Personal history of pneumonia (recurrent): Secondary | ICD-10-CM | POA: Insufficient documentation

## 2013-05-29 DIAGNOSIS — K219 Gastro-esophageal reflux disease without esophagitis: Secondary | ICD-10-CM | POA: Insufficient documentation

## 2013-05-29 LAB — URINALYSIS, ROUTINE W REFLEX MICROSCOPIC
Bilirubin Urine: NEGATIVE
Ketones, ur: NEGATIVE mg/dL
Nitrite: NEGATIVE
Protein, ur: NEGATIVE mg/dL
Urobilinogen, UA: 1 mg/dL (ref 0.0–1.0)
pH: 7.5 (ref 5.0–8.0)

## 2013-05-29 LAB — CBC WITH DIFFERENTIAL/PLATELET
HCT: 37 % — ABNORMAL LOW (ref 39.0–52.0)
Hemoglobin: 12.7 g/dL — ABNORMAL LOW (ref 13.0–17.0)
Lymphs Abs: 2 10*3/uL (ref 0.7–4.0)
MCH: 32.7 pg (ref 26.0–34.0)
MCHC: 34.3 g/dL (ref 30.0–36.0)
Monocytes Absolute: 1.2 10*3/uL — ABNORMAL HIGH (ref 0.1–1.0)
Monocytes Relative: 13 % — ABNORMAL HIGH (ref 3–12)
Neutro Abs: 6.1 10*3/uL (ref 1.7–7.7)
Neutrophils Relative %: 65 % (ref 43–77)
RBC: 3.88 MIL/uL — ABNORMAL LOW (ref 4.22–5.81)

## 2013-05-29 LAB — COMPREHENSIVE METABOLIC PANEL
Alkaline Phosphatase: 63 U/L (ref 39–117)
BUN: 9 mg/dL (ref 6–23)
Chloride: 102 mEq/L (ref 96–112)
Creatinine, Ser: 0.58 mg/dL (ref 0.50–1.35)
GFR calc Af Amer: >90 mL/min (ref >90)
GFR calc non Af Amer: >90 mL/min (ref >90)
Glucose, Bld: 130 mg/dL — ABNORMAL HIGH (ref 70–99)
Potassium: 3.7 mEq/L (ref 3.5–5.1)
Total Bilirubin: 0.7 mg/dL (ref 0.3–1.2)

## 2013-05-29 LAB — LIPASE, BLOOD: Lipase: 40 U/L (ref 11–59)

## 2013-05-29 MED ORDER — SODIUM CHLORIDE 0.9 % IV BOLUS (SEPSIS)
1000.0000 mL | Freq: Once | INTRAVENOUS | Status: AC
Start: 2013-05-29 — End: 2013-05-29
  Administered 2013-05-29: 1000 mL via INTRAVENOUS

## 2013-05-29 MED ORDER — ONDANSETRON HCL 4 MG PO TABS: 4.0000 mg | ORAL_TABLET | Freq: Four times a day (QID) | ORAL | Status: DC

## 2013-05-29 MED ORDER — HYDROMORPHONE HCL PF 1 MG/ML IJ SOLN
1.0000 mg | Freq: Once | INTRAMUSCULAR | Status: AC
  Administered 2013-05-29: 1 mg via INTRAVENOUS
  Filled 2013-05-29: qty 1

## 2013-05-29 MED ORDER — ONDANSETRON HCL 4 MG/2ML IJ SOLN
4.0000 mg | Freq: Once | INTRAMUSCULAR | Status: AC
  Administered 2013-05-29: 4 mg via INTRAVENOUS
  Filled 2013-05-29: qty 2

## 2013-05-29 MED ORDER — SODIUM CHLORIDE 0.9 % IV BOLUS (SEPSIS)
1000.0000 mL | Freq: Once | INTRAVENOUS | Status: AC
  Administered 2013-05-29: 1000 mL via INTRAVENOUS

## 2013-05-29 MED ORDER — PROMETHAZINE HCL 25 MG/ML IJ SOLN
12.5000 mg | Freq: Once | INTRAMUSCULAR | Status: AC
  Administered 2013-05-29: 12.5 mg via INTRAVENOUS
  Filled 2013-05-29: qty 1

## 2013-05-29 NOTE — ED Notes (Signed)
Pt actively vomiting.
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
## 2013-05-29 NOTE — ED Notes (Signed)
Per EMS - pt was just d/c here yesterday for chronic pancreatitis, had been admitted for 3 days. Pt called ems c/o n/v and abd pain. Pt was sent home with prescriptions but never got them filled, sts he doesn't have money to fill them. But has taken oxycodone but it didn't help. EMS started a 18G in left forearm. BP 154/93 HR 84 regular, 95% on room air. Pt was actively vomiting on ride to ED. EMS administered 4mg  zofran IV.

## 2013-05-29 NOTE — ED Notes (Signed)
Lab at bedside

## 2013-05-29 NOTE — ED Notes (Signed)
Pharmacy tech at bedside 

## 2013-05-29 NOTE — Progress Notes (Signed)
PT DISCHARGED HOME. REVIEWED DISCHARGE INSTRUCTIONS WITH PATIENT AND SON.  PRESCRIPTIONS GIVEN TO PT.PT WAS GIVEN INFORMATION ABOUT COMM[employer]ITY [employer] FOR ASSISTANCE WITH SHELTER AND FINANCIAL HELP. PT AND SON VU.

## 2013-05-30 NOTE — ED Provider Notes (Signed)
Medical screening examination/treatment/procedure(s) were performed by non-physician practitioner and as supervising physician I was immediately available for consultation/collaboration.   Loren Racer, MD 05/30/13 [phone]

## 2013-06-03 NOTE — Addendum Note (Signed)
Addended by: Neomia Dear on: 06/03/2013 11:34 AM   Modules accepted: Orders

## 2013-06-09 ENCOUNTER — Emergency Department (HOSPITAL_COMMUNITY)
Admission: EM | Admit: 2013-06-09 | Discharge: 2013-06-09 | Disposition: A | Discharge status: Home / Self Care | Payer: No Typology Code available for payment source | Attending: Emergency Medicine | Admitting: Emergency Medicine

## 2013-06-09 ENCOUNTER — Encounter (HOSPITAL_COMMUNITY): Payer: Self-pay | Admitting: Emergency Medicine

## 2013-06-09 DIAGNOSIS — F17200 Nicotine dependence, unspecified, uncomplicated: Secondary | ICD-10-CM | POA: Insufficient documentation

## 2013-06-09 DIAGNOSIS — N400 Enlarged prostate without lower urinary tract symptoms: Secondary | ICD-10-CM | POA: Insufficient documentation

## 2013-06-09 DIAGNOSIS — Z79899 Other long term (current) drug therapy: Secondary | ICD-10-CM | POA: Insufficient documentation

## 2013-06-09 DIAGNOSIS — Z8679 Personal history of other diseases of the circulatory system: Secondary | ICD-10-CM | POA: Insufficient documentation

## 2013-06-09 DIAGNOSIS — F1910 Other psychoactive substance abuse, uncomplicated: Secondary | ICD-10-CM | POA: Insufficient documentation

## 2013-06-09 DIAGNOSIS — Z8719 Personal history of other diseases of the digestive system: Secondary | ICD-10-CM | POA: Insufficient documentation

## 2013-06-09 DIAGNOSIS — F411 Generalized anxiety disorder: Secondary | ICD-10-CM | POA: Insufficient documentation

## 2013-06-09 DIAGNOSIS — R209 Unspecified disturbances of skin sensation: Secondary | ICD-10-CM | POA: Insufficient documentation

## 2013-06-09 DIAGNOSIS — K219 Gastro-esophageal reflux disease without esophagitis: Secondary | ICD-10-CM | POA: Insufficient documentation

## 2013-06-09 DIAGNOSIS — E079 Disorder of thyroid, unspecified: Secondary | ICD-10-CM | POA: Insufficient documentation

## 2013-06-09 DIAGNOSIS — G8929 Other chronic pain: Secondary | ICD-10-CM | POA: Insufficient documentation

## 2013-06-09 DIAGNOSIS — Z8709 Personal history of other diseases of the respiratory system: Secondary | ICD-10-CM | POA: Insufficient documentation

## 2013-06-09 DIAGNOSIS — Z8619 Personal history of other infectious and parasitic diseases: Secondary | ICD-10-CM | POA: Insufficient documentation

## 2013-06-09 DIAGNOSIS — F1011 Alcohol abuse, in remission: Secondary | ICD-10-CM | POA: Insufficient documentation

## 2013-06-09 DIAGNOSIS — Z8701 Personal history of pneumonia (recurrent): Secondary | ICD-10-CM | POA: Insufficient documentation

## 2013-06-09 DIAGNOSIS — M1990 Unspecified osteoarthritis, unspecified site: Secondary | ICD-10-CM | POA: Insufficient documentation

## 2013-06-09 DIAGNOSIS — Z8781 Personal history of (healed) traumatic fracture: Secondary | ICD-10-CM | POA: Insufficient documentation

## 2013-06-09 DIAGNOSIS — R202 Paresthesia of skin: Secondary | ICD-10-CM

## 2013-06-09 MED ORDER — SODIUM CHLORIDE 0.9 % IV BOLUS (SEPSIS): 1000.0000 mL | Freq: Once | INTRAVENOUS | Status: DC

## 2013-06-09 MED ORDER — THIAMINE HCL 100 MG/ML IJ SOLN: 100.0000 mg | Freq: Once | INTRAMUSCULAR | Status: DC

## 2013-06-09 MED ORDER — ONDANSETRON HCL 4 MG/2ML IJ SOLN
4.0000 mg | Freq: Once | INTRAMUSCULAR | Status: DC
Start: 2013-06-09 — End: 2013-06-09

## 2013-06-09 NOTE — ED Notes (Signed)
Pt. arrived with EMS from home reports numbness at feet x 2 weeks and numbness at hands onset this morning , ambulatory , + etoh inatke last night , history of chronic pancreatitis /alcohol abuse . Also reports diffused abdominal pain . CBG= 84 by EMS.
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
## 2013-06-09 NOTE — ED Provider Notes (Signed)
Medical screening examination/treatment/procedure(s) were conducted as a shared visit with non-physician practitioner(s) and myself.  I personally evaluated the patient during the encounter  Brandt Loosen, MD 06/09/13 2255

## 2013-06-10 NOTE — Discharge Summary (Signed)
I saw [name] on day of discharge and assisted in the discharge planning.

## 2013-06-18 ENCOUNTER — Other Ambulatory Visit: Payer: Self-pay | Admitting: *Deleted

## 2013-06-18 DIAGNOSIS — M25512 Pain in left shoulder: Secondary | ICD-10-CM

## 2013-06-18 DIAGNOSIS — S42002D Fracture of unspecified part of left clavicle, subsequent encounter for fracture with routine healing: Secondary | ICD-10-CM

## 2013-06-20 ENCOUNTER — Emergency Department (HOSPITAL_COMMUNITY)
Admission: EM | Admit: 2013-06-20 | Discharge: 2013-06-20 | Disposition: A | Discharge status: Other Acute Inpatient Hospital | Payer: No Typology Code available for payment source | Attending: Emergency Medicine | Admitting: Emergency Medicine

## 2013-06-20 ENCOUNTER — Encounter (HOSPITAL_COMMUNITY): Payer: Self-pay | Admitting: *Deleted

## 2013-06-20 ENCOUNTER — Inpatient Hospital Stay (HOSPITAL_COMMUNITY)
Admission: AD | Admit: 2013-06-20 | Discharge: 2013-06-23 | DRG: 897 | Disposition: A | Discharge status: Home / Self Care | Payer: No Typology Code available for payment source | Source: Intra-hospital | Attending: Psychiatry | Admitting: Psychiatry

## 2013-06-20 DIAGNOSIS — F1020 Alcohol dependence, uncomplicated: Secondary | ICD-10-CM

## 2013-06-20 DIAGNOSIS — F17200 Nicotine dependence, unspecified, uncomplicated: Secondary | ICD-10-CM | POA: Insufficient documentation

## 2013-06-20 DIAGNOSIS — N400 Enlarged prostate without lower urinary tract symptoms: Secondary | ICD-10-CM

## 2013-06-20 DIAGNOSIS — Z8781 Personal history of (healed) traumatic fracture: Secondary | ICD-10-CM | POA: Insufficient documentation

## 2013-06-20 DIAGNOSIS — Z8639 Personal history of other endocrine, nutritional and metabolic disease: Secondary | ICD-10-CM | POA: Insufficient documentation

## 2013-06-20 DIAGNOSIS — G8929 Other chronic pain: Secondary | ICD-10-CM

## 2013-06-20 DIAGNOSIS — Z8709 Personal history of other diseases of the respiratory system: Secondary | ICD-10-CM | POA: Insufficient documentation

## 2013-06-20 DIAGNOSIS — Z79899 Other long term (current) drug therapy: Secondary | ICD-10-CM | POA: Insufficient documentation

## 2013-06-20 DIAGNOSIS — Z8619 Personal history of other infectious and parasitic diseases: Secondary | ICD-10-CM | POA: Insufficient documentation

## 2013-06-20 DIAGNOSIS — F411 Generalized anxiety disorder: Secondary | ICD-10-CM | POA: Insufficient documentation

## 2013-06-20 DIAGNOSIS — M545 Low back pain, unspecified: Secondary | ICD-10-CM | POA: Diagnosis present

## 2013-06-20 DIAGNOSIS — F1010 Alcohol abuse, uncomplicated: Secondary | ICD-10-CM | POA: Insufficient documentation

## 2013-06-20 DIAGNOSIS — R748 Abnormal levels of other serum enzymes: Secondary | ICD-10-CM | POA: Insufficient documentation

## 2013-06-20 DIAGNOSIS — M129 Arthropathy, unspecified: Secondary | ICD-10-CM | POA: Insufficient documentation

## 2013-06-20 DIAGNOSIS — F329 Major depressive disorder, single episode, unspecified: Secondary | ICD-10-CM | POA: Diagnosis present

## 2013-06-20 DIAGNOSIS — Z8701 Personal history of pneumonia (recurrent): Secondary | ICD-10-CM | POA: Insufficient documentation

## 2013-06-20 DIAGNOSIS — Z862 Personal history of diseases of the blood and blood-forming organs and certain disorders involving the immune mechanism: Secondary | ICD-10-CM | POA: Insufficient documentation

## 2013-06-20 DIAGNOSIS — K219 Gastro-esophageal reflux disease without esophagitis: Secondary | ICD-10-CM | POA: Diagnosis present

## 2013-06-20 DIAGNOSIS — Z87448 Personal history of other diseases of urinary system: Secondary | ICD-10-CM | POA: Insufficient documentation

## 2013-06-20 DIAGNOSIS — F10239 Alcohol dependence with withdrawal, unspecified: Principal | ICD-10-CM | POA: Diagnosis present

## 2013-06-20 DIAGNOSIS — F10939 Alcohol use, unspecified with withdrawal, unspecified: Principal | ICD-10-CM

## 2013-06-20 DIAGNOSIS — E0590 Thyrotoxicosis, unspecified without thyrotoxic crisis or storm: Secondary | ICD-10-CM | POA: Diagnosis present

## 2013-06-20 LAB — COMPREHENSIVE METABOLIC PANEL
AST: 118 U/L — ABNORMAL HIGH (ref 0–37)
Albumin: 3.5 g/dL (ref 3.5–5.2)
Alkaline Phosphatase: 85 U/L (ref 39–117)
BUN: 6 mg/dL (ref 6–23)
Calcium: 8.9 mg/dL (ref 8.4–10.5)
Chloride: 103 mEq/L (ref 96–112)
Creatinine, Ser: 0.6 mg/dL (ref 0.50–1.35)
Potassium: 3.7 mEq/L (ref 3.5–5.1)
Total Protein: 7.5 g/dL (ref 6.0–8.3)

## 2013-06-20 LAB — RAPID URINE DRUG SCREEN, HOSP PERFORMED
Amphetamines: NOT DETECTED
Benzodiazepines: NOT DETECTED
Opiates: NOT DETECTED
Tetrahydrocannabinol: NOT DETECTED

## 2013-06-20 LAB — CBC WITH DIFFERENTIAL/PLATELET
Basophils Relative: 1 % (ref 0–1)
Eosinophils Absolute: 0.1 10*3/uL (ref 0.0–0.7)
Eosinophils Relative: 1 % (ref 0–5)
Lymphs Abs: 4.8 10*3/uL — ABNORMAL HIGH (ref 0.7–4.0)
MCH: 33.5 pg (ref 26.0–34.0)
MCHC: 34.4 g/dL (ref 30.0–36.0)
MCV: 97.5 fL (ref 78.0–100.0)
Platelets: 261 10*3/uL (ref 150–400)
RBC: 4 MIL/uL — ABNORMAL LOW (ref 4.22–5.81)

## 2013-06-20 LAB — ETHANOL: Alcohol, Ethyl (B): 212 mg/dL — ABNORMAL HIGH (ref 0–11)

## 2013-06-20 MED ORDER — FOLIC ACID 1 MG PO TABS
1.0000 mg | ORAL_TABLET | Freq: Every day | ORAL | Status: DC
  Administered 2013-06-21 – 2013-06-23 (×3): 1 mg via ORAL
  Filled 2013-06-20 (×4): qty 1

## 2013-06-20 MED ORDER — HYDROXYZINE HCL 25 MG PO TABS
25.0000 mg | ORAL_TABLET | Freq: Four times a day (QID) | ORAL | Status: DC | PRN
  Administered 2013-06-22 – 2013-06-23 (×2): 25 mg via ORAL

## 2013-06-20 MED ORDER — ALUM & MAG HYDROXIDE-SIMETH 200-200-20 MG/5ML PO SUSP: 30.0000 mL | ORAL | Status: DC | PRN

## 2013-06-20 MED ORDER — THIAMINE HCL 100 MG/ML IJ SOLN: 100.0000 mg | Freq: Every day | INTRAMUSCULAR | Status: DC

## 2013-06-20 MED ORDER — MAGNESIUM HYDROXIDE 400 MG/5ML PO SUSP: 30.0000 mL | Freq: Every day | ORAL | Status: DC | PRN

## 2013-06-20 MED ORDER — ATORVASTATIN CALCIUM 20 MG PO TABS: 20.0000 mg | ORAL_TABLET | Freq: Every day | ORAL | Status: DC

## 2013-06-20 MED ORDER — PANTOPRAZOLE SODIUM 40 MG PO TBEC: 80.0000 mg | DELAYED_RELEASE_TABLET | Freq: Every day | ORAL | Status: DC

## 2013-06-20 MED ORDER — LORAZEPAM 1 MG PO TABS: 1.0000 mg | ORAL_TABLET | Freq: Four times a day (QID) | ORAL | Status: DC | PRN

## 2013-06-20 MED ORDER — CHLORDIAZEPOXIDE HCL 25 MG PO CAPS
25.0000 mg | ORAL_CAPSULE | Freq: Four times a day (QID) | ORAL | Status: DC | PRN
  Administered 2013-06-22: 25 mg via ORAL
  Filled 2013-06-20 (×2): qty 1

## 2013-06-20 MED ORDER — VITAMIN B-1 100 MG PO TABS
100.0000 mg | ORAL_TABLET | Freq: Every day | ORAL | Status: DC
  Administered 2013-06-21 – 2013-06-23 (×3): 100 mg via ORAL
  Filled 2013-06-20 (×4): qty 1

## 2013-06-20 MED ORDER — NICOTINE 21 MG/24HR TD PT24
21.0000 mg | MEDICATED_PATCH | Freq: Every day | TRANSDERMAL | Status: DC
  Administered 2013-06-20: 21 mg via TRANSDERMAL
  Filled 2013-06-20: qty 1

## 2013-06-20 MED ORDER — PROMETHAZINE HCL 25 MG PO TABS: 25.0000 mg | ORAL_TABLET | Freq: Four times a day (QID) | ORAL | Status: DC | PRN

## 2013-06-20 MED ORDER — CHLORDIAZEPOXIDE HCL 25 MG PO CAPS: 25.0000 mg | ORAL_CAPSULE | Freq: Every day | ORAL | Status: DC

## 2013-06-20 MED ORDER — DOXAZOSIN MESYLATE 4 MG PO TABS
4.0000 mg | ORAL_TABLET | Freq: Every day | ORAL | Status: DC
Start: 2013-06-20 — End: 2013-06-20
  Administered 2013-06-20: 4 mg via ORAL
  Filled 2013-06-20: qty 1

## 2013-06-20 MED ORDER — IBUPROFEN 200 MG PO TABS
600.0000 mg | ORAL_TABLET | Freq: Three times a day (TID) | ORAL | Status: DC | PRN
  Filled 2013-06-20: qty 3

## 2013-06-20 MED ORDER — ADULT MULTIVITAMIN W/MINERALS CH
1.0000 | ORAL_TABLET | Freq: Every day | ORAL | Status: DC
  Administered 2013-06-20: 1 via ORAL
  Filled 2013-06-20: qty 1

## 2013-06-20 MED ORDER — CHLORDIAZEPOXIDE HCL 25 MG PO CAPS
25.0000 mg | ORAL_CAPSULE | ORAL | Status: DC
  Administered 2013-06-22: 25 mg via ORAL
  Filled 2013-06-20: qty 1

## 2013-06-20 MED ORDER — GI COCKTAIL ~~LOC~~
30.0000 mL | Freq: Once | ORAL | Status: AC
  Administered 2013-06-20: 30 mL via ORAL
  Filled 2013-06-20: qty 30

## 2013-06-20 MED ORDER — POTASSIUM CHLORIDE 20 MEQ/15ML (10%) PO LIQD
20.0000 meq | Freq: Two times a day (BID) | ORAL | Status: DC
  Administered 2013-06-20: 20 meq via ORAL
  Filled 2013-06-20: qty 15

## 2013-06-20 MED ORDER — NICOTINE 21 MG/24HR TD PT24
21.0000 mg | MEDICATED_PATCH | Freq: Every day | TRANSDERMAL | Status: DC
  Administered 2013-06-21 – 2013-06-23 (×3): 21 mg via TRANSDERMAL
  Filled 2013-06-20 (×4): qty 1

## 2013-06-20 MED ORDER — LORAZEPAM 1 MG PO TABS: 1.0000 mg | ORAL_TABLET | Freq: Three times a day (TID) | ORAL | Status: DC | PRN

## 2013-06-20 MED ORDER — FAMOTIDINE 20 MG PO TABS
20.0000 mg | ORAL_TABLET | Freq: Once | ORAL | Status: AC
  Administered 2013-06-20: 20 mg via ORAL
  Filled 2013-06-20: qty 1

## 2013-06-20 MED ORDER — ONDANSETRON 4 MG PO TBDP: 4.0000 mg | ORAL_TABLET | Freq: Four times a day (QID) | ORAL | Status: DC | PRN

## 2013-06-20 MED ORDER — ONDANSETRON HCL 4 MG PO TABS: 4.0000 mg | ORAL_TABLET | Freq: Three times a day (TID) | ORAL | Status: DC | PRN

## 2013-06-20 MED ORDER — CHLORDIAZEPOXIDE HCL 25 MG PO CAPS
25.0000 mg | ORAL_CAPSULE | Freq: Four times a day (QID) | ORAL | Status: AC
  Administered 2013-06-20 – 2013-06-21 (×5): 25 mg via ORAL
  Filled 2013-06-20 (×6): qty 1

## 2013-06-20 MED ORDER — ACETAMINOPHEN 325 MG PO TABS: 650.0000 mg | ORAL_TABLET | Freq: Four times a day (QID) | ORAL | Status: DC | PRN

## 2013-06-20 MED ORDER — CHLORDIAZEPOXIDE HCL 25 MG PO CAPS
25.0000 mg | ORAL_CAPSULE | Freq: Three times a day (TID) | ORAL | Status: AC
  Administered 2013-06-22 – 2013-06-23 (×4): 25 mg via ORAL
  Filled 2013-06-20 (×3): qty 1

## 2013-06-20 MED ORDER — PAROXETINE HCL 20 MG PO TABS
20.0000 mg | ORAL_TABLET | Freq: Every day | ORAL | Status: DC
  Filled 2013-06-20: qty 1

## 2013-06-20 MED ORDER — CHLORDIAZEPOXIDE HCL 25 MG PO CAPS
50.0000 mg | ORAL_CAPSULE | Freq: Once | ORAL | Status: AC
  Administered 2013-06-20: 50 mg via ORAL
  Filled 2013-06-20: qty 2

## 2013-06-20 MED ORDER — FINASTERIDE 5 MG PO TABS
5.0000 mg | ORAL_TABLET | Freq: Every day | ORAL | Status: DC
  Filled 2013-06-20: qty 1

## 2013-06-20 MED ORDER — VITAMIN B-1 100 MG PO TABS
100.0000 mg | ORAL_TABLET | Freq: Every day | ORAL | Status: DC
  Administered 2013-06-20: 100 mg via ORAL
  Filled 2013-06-20: qty 1

## 2013-06-20 MED ORDER — FOLIC ACID 1 MG PO TABS
1.0000 mg | ORAL_TABLET | Freq: Every day | ORAL | Status: DC
  Administered 2013-06-20: 1 mg via ORAL
  Filled 2013-06-20: qty 1

## 2013-06-20 MED ORDER — ONDANSETRON HCL 4 MG PO TABS: 4.0000 mg | ORAL_TABLET | Freq: Four times a day (QID) | ORAL | Status: DC

## 2013-06-20 MED ORDER — ADULT MULTIVITAMIN W/MINERALS CH
1.0000 | ORAL_TABLET | Freq: Every day | ORAL | Status: DC
  Administered 2013-06-21 – 2013-06-23 (×3): 1 via ORAL
  Filled 2013-06-20 (×4): qty 1

## 2013-06-20 MED ORDER — PANTOPRAZOLE SODIUM 40 MG PO TBEC
40.0000 mg | DELAYED_RELEASE_TABLET | Freq: Once | ORAL | Status: AC
  Administered 2013-06-20: 40 mg via ORAL
  Filled 2013-06-20: qty 1

## 2013-06-20 MED ORDER — LOPERAMIDE HCL 2 MG PO CAPS: 2.0000 mg | ORAL_CAPSULE | ORAL | Status: DC | PRN

## 2013-06-20 MED ORDER — LORAZEPAM 1 MG PO TABS: 0.0000 mg | ORAL_TABLET | Freq: Two times a day (BID) | ORAL | Status: DC

## 2013-06-20 MED ORDER — LORAZEPAM 1 MG PO TABS
0.0000 mg | ORAL_TABLET | Freq: Four times a day (QID) | ORAL | Status: DC
  Administered 2013-06-20: 1 mg via ORAL
  Filled 2013-06-20: qty 1

## 2013-06-20 MED ORDER — LORAZEPAM 2 MG/ML IJ SOLN: 1.0000 mg | Freq: Four times a day (QID) | INTRAMUSCULAR | Status: DC | PRN

## 2013-06-20 NOTE — ED Notes (Signed)
pateitnt belongings in locker 34

## 2013-06-20 NOTE — ED Notes (Signed)
Pt here for etoh detox. Last drink was about an hour ago, states he drank 1 40 oz. Has been binge drinking for the past 2 months. Reports chronic left shoulder pain, and hx of pancreatitis
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
## 2013-06-20 NOTE — ED Notes (Addendum)
Pt has wallet, ID, 1 5 dollar bill, a ripped 1 dollar bill, 1 pair blue jeans, belt, t-shirt, boxers, socks, hat, keys, 6 cigarettes, lighter, knife, 2 flashlights, watch, 3 rings, loose change, golf ball, and sneakers in locker #32

## 2013-06-20 NOTE — ED Provider Notes (Signed)
Medical screening examination/treatment/procedure(s) were conducted as a shared visit with non-physician practitioner(s) and myself.  I personally evaluated the patient during the encounter  This 57 year old male requests alcohol detox, he states he has never been through detox before, he denies any threats to hurt himself or others, he denies suicidal or homicidal ideation, he is not in withdrawal upon arrival, he states is chronic severe abdominal pain has not changed in 15 years, he has minimal diffuse abdominal tenderness without rebound.  Accepted at [facility]. 2150  Hurman Horn, MD 06/21/13 1308
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
## 2013-06-21 ENCOUNTER — Encounter (HOSPITAL_COMMUNITY): Payer: Self-pay | Admitting: Psychiatry

## 2013-06-21 DIAGNOSIS — F1020 Alcohol dependence, uncomplicated: Secondary | ICD-10-CM | POA: Diagnosis present

## 2013-06-21 DIAGNOSIS — F1994 Other psychoactive substance use, unspecified with psychoactive substance-induced mood disorder: Secondary | ICD-10-CM

## 2013-06-21 DIAGNOSIS — F411 Generalized anxiety disorder: Secondary | ICD-10-CM

## 2013-06-21 DIAGNOSIS — F10939 Alcohol use, unspecified with withdrawal, unspecified: Principal | ICD-10-CM | POA: Diagnosis present

## 2013-06-21 DIAGNOSIS — F10239 Alcohol dependence with withdrawal, unspecified: Principal | ICD-10-CM | POA: Diagnosis present

## 2013-06-21 DIAGNOSIS — F1910 Other psychoactive substance abuse, uncomplicated: Secondary | ICD-10-CM

## 2013-06-21 DIAGNOSIS — F1010 Alcohol abuse, uncomplicated: Secondary | ICD-10-CM

## 2013-06-21 MED ORDER — DULOXETINE HCL 20 MG PO CPEP
20.0000 mg | ORAL_CAPSULE | Freq: Every day | ORAL | Status: DC
  Administered 2013-06-21 – 2013-06-23 (×3): 20 mg via ORAL
  Filled 2013-06-21 (×6): qty 1

## 2013-06-21 MED ORDER — CEPHALEXIN 500 MG PO CAPS
500.0000 mg | ORAL_CAPSULE | Freq: Three times a day (TID) | ORAL | Status: DC
  Administered 2013-06-21 – 2013-06-23 (×7): 500 mg via ORAL
  Filled 2013-06-21 (×3): qty 1
  Filled 2013-06-21 (×2): qty 2
  Filled 2013-06-21 (×9): qty 1

## 2013-06-21 MED ORDER — FINASTERIDE 5 MG PO TABS
5.0000 mg | ORAL_TABLET | Freq: Every day | ORAL | Status: DC
  Administered 2013-06-22 – 2013-06-23 (×2): 5 mg via ORAL
  Filled 2013-06-21 (×3): qty 1

## 2013-06-21 MED ORDER — BACITRACIN-NEOMYCIN-POLYMYXIN 400-5-5000 EX OINT
TOPICAL_OINTMENT | Freq: Every day | CUTANEOUS | Status: DC
  Administered 2013-06-21 – 2013-06-22 (×2): via TOPICAL
  Administered 2013-06-23: 1 via TOPICAL
  Filled 2013-06-21: qty 1

## 2013-06-21 MED ORDER — PANTOPRAZOLE SODIUM 40 MG PO TBEC
40.0000 mg | DELAYED_RELEASE_TABLET | Freq: Every day | ORAL | Status: DC
  Administered 2013-06-22 – 2013-06-23 (×2): 40 mg via ORAL
  Filled 2013-06-21 (×3): qty 1

## 2013-06-21 MED ORDER — METHOCARBAMOL 500 MG PO TABS
500.0000 mg | ORAL_TABLET | Freq: Three times a day (TID) | ORAL | Status: DC
  Administered 2013-06-21 – 2013-06-22 (×3): 500 mg via ORAL
  Filled 2013-06-21 (×9): qty 1

## 2013-06-21 MED ORDER — SIMVASTATIN 20 MG PO TABS
20.0000 mg | ORAL_TABLET | Freq: Every day | ORAL | Status: DC
  Administered 2013-06-21 – 2013-06-22 (×2): 20 mg via ORAL
  Filled 2013-06-21 (×4): qty 1

## 2013-06-21 MED ORDER — GABAPENTIN 300 MG PO CAPS
300.0000 mg | ORAL_CAPSULE | Freq: Three times a day (TID) | ORAL | Status: DC
  Administered 2013-06-21 – 2013-06-23 (×6): 300 mg via ORAL
  Filled 2013-06-21 (×9): qty 1

## 2013-06-21 NOTE — [facility] Group Notes (Signed)
[facility] Group Notes: (Clinical Social Work)   06/21/2013      Type of Therapy:  Group Therapy   Participation Level:  Did Not Attend  - was present at the beginning of group but was called out to see doctor and did not return.   Ambrose Mantle, LCSW 06/21/2013, 12:06 PM

## 2013-06-21 NOTE — Progress Notes (Signed)
Spoke with pt 1:1 who is resting in room. Pt irritable, refusing group. "I'm in too much pain. Why aren't you all giving me my pain meds? What happens if I withdrawal? My pain is an 8/10. My stomach and shoulder hurt all the time." Explained to pt the brain chemistry of addiction and offered support. Also offered to look at available prn's but pt refuses. Pt denies SI/HI/aVH and remains safe. [name]

## 2013-06-21 NOTE — Progress Notes (Signed)
Patient ID: [name], male   DOB: March 26, 1956, 57 y.o.   MRN: [id number] CSW unable to wake patient to complete PSA at 9:45 AM; roommate reports patient admitted early AM. CSW wioll attempt to meet with patient later today.  Carney Bern, LCSW

## 2013-06-21 NOTE — [facility] Suicide Risk Assessment (Signed)
Suicide Risk Assessment  Admission Assessment     Nursing information obtained from:  Patient Demographic factors:  Caucasian;Divorced or widowed;Male;Low socioeconomic status;Living alone;Unemployed Current Mental Status:  NA (Pt denies) Loss Factors:  Decline in physical health;Financial problems / change in socioeconomic status Historical Factors:  NA (Pt denies) Risk Reduction Factors:  Sense of responsibility to family  CLINICAL FACTORS:   Severe Anxiety and/or Agitation Depression:   Anhedonia Comorbid alcohol abuse/dependence Hopelessness Impulsivity Insomnia Recent sense of peace/wellbeing Severe Alcohol/Substance Abuse/Dependencies Previous Psychiatric Diagnoses and Treatments Medical Diagnoses and Treatments/Surgeries  COGNITIVE FEATURES THAT CONTRIBUTE TO RISK:  Closed-mindedness Loss of executive function Polarized thinking Thought constriction (tunnel vision)    SUICIDE RISK:   Moderate:  Frequent suicidal ideation with limited intensity, and duration, some specificity in terms of plans, no associated intent, good self-control, limited dysphoria/symptomatology, some risk factors present, and identifiable protective factors, including available and accessible social support.  PLAN OF CARE: Patient admitted from [facility] ER for alcohol detox treatment and substance related depression.   I certify that inpatient services furnished can reasonably be expected to improve the patient's condition.   Nehemiah Settle., MD 06/21/2013, 10:18 AM
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
## 2013-06-21 NOTE — Progress Notes (Signed)
Adult Psychoeducational Group Note  Date:  06/21/2013 Time:  3:34 PM  Group Topic/Focus:  Healthy Communication:   The focus of this group is to discuss communication, barriers to communication, as well as healthy ways to communicate with others.  Participation Level:  Did Not Attend    Elijio Miles 06/21/2013, 3:34 PM

## 2013-06-21 NOTE — Progress Notes (Addendum)
D Pt is seen OOB UAL on the 300 hall today...tolerated fair. HE is not engaged in his recovery and / or his treatment as evidenced  By him laying in his bed . He does not attend his groups  And he asks over and over for his " pain medications", saying " You can't take me off my medicines..I'll go into withdrawal if you don't give me some oxy."   A  Pt is disheveled, he says " I feel awful", he is unkempt  And he signs 72 hr request for DC. Nurse placed sticky note on pt's chart.   R Safety is in place and POC moves forward.

## 2013-06-21 NOTE — [facility] Group Notes (Signed)
[facility] Group Notes:  (Nursing/MHT/Case Management/Adjunct)  Date:  06/21/2013  Time:  12:16 PM  Type of Therapy: Psychoeducational Skills  Participation Level: Did Not Attend     Buford Dresser 06/21/2013, 12:16 PM

## 2013-06-21 NOTE — Progress Notes (Signed)
Patient did not attend the evening speaker AA meeting. Pt remained in bed during meeting.   

## 2013-06-21 NOTE — Tx Team (Addendum)
Initial Interdisciplinary Treatment Plan  PATIENT STRENGTHS: (choose at least two) Active sense of humor Average or above average intelligence Motivation for treatment/growth  PATIENT STRESSORS: Financial difficulties Health problems Substance abuse   PROBLEM LIST: Problem List/Patient Goals Date to be addressed Date deferred Reason deferred Estimated date of resolution  ETOH abuse 06/21/13     Depression 06/21/13                                                DISCHARGE CRITERIA:  Adequate post-discharge living arrangements Improved stabilization in mood, thinking, and/or behavior Motivation to continue treatment in a less acute level of care Verbal commitment to aftercare and medication compliance Withdrawal symptoms are absent or subacute and managed without 24-hour nursing intervention  PRELIMINARY DISCHARGE PLAN: Attend 12-step recovery group Placement in alternative living arrangements  PATIENT/FAMIILY INVOLVEMENT: This treatment plan has been presented to and reviewed with the patient, [name].  The patient and family have been given the opportunity to ask questions and make suggestions.  Juliann Pares 06/21/2013, 12:01 AM
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
## 2013-06-21 NOTE — Progress Notes (Signed)
[name]  Is doing fair...getting acclamated to the unit milieu. He is pleasant and appreciative of anything done for him. HE walks with generalized stiffness, explaining he " doesn't feel that well  And I need my pain medicine".      A He is started on a regimen of meds , including robaxin, neurontinn and cymbalta ( for pain) keflex (for skin infection) and zocor and protonix.      R Safety is in place and POC moves forward.

## 2013-06-22 DIAGNOSIS — F329 Major depressive disorder, single episode, unspecified: Secondary | ICD-10-CM

## 2013-06-22 DIAGNOSIS — F10229 Alcohol dependence with intoxication, unspecified: Secondary | ICD-10-CM

## 2013-06-22 DIAGNOSIS — F10239 Alcohol dependence with withdrawal, unspecified: Principal | ICD-10-CM

## 2013-06-22 MED ORDER — METHOCARBAMOL 500 MG PO TABS
500.0000 mg | ORAL_TABLET | Freq: Four times a day (QID) | ORAL | Status: DC
  Administered 2013-06-22 – 2013-06-23 (×4): 500 mg via ORAL
  Filled 2013-06-22 (×8): qty 1

## 2013-06-22 MED ORDER — METHOCARBAMOL 500 MG PO TABS: 500.0000 mg | ORAL_TABLET | Freq: Four times a day (QID) | ORAL | Status: DC

## 2013-06-22 NOTE — Progress Notes (Addendum)
Psychoeducational Group Note  Date:  06/22/2013 Time: 115  Group Topic/Focus:  Living with healthy attitudes :   The focus of this group is to help patients identify negative/unhealthy attitudes they were using prior to admission and identify positive/healthier attitudes to replace them with  upon discharge.  Participation Level:  Did Not Attend  Participation Quality:    Affect:    Cognitive:    Insight:    Engagement in Group:    Additional Comments:  **  [name] 2:37 PM. 06/22/2013

## 2013-06-22 NOTE — Progress Notes (Signed)
Psychoeducational Group Note  Date 06/21/13 :Time: 115  Group Topic/Focus:  Identifying Needs:   The focus of this group is to help patients identify their personal needs that have been historically problematic and identify healthy behaviors to address their needs.  Participation Level:  Did Not Attend  Participation Quality:    Affect:    Cognitive :   Insight :   Engagement in Group:   Additional Comments:  06/21/2013 PD RN [facility]

## 2013-06-22 NOTE — Progress Notes (Signed)
Patient did not attend the evening speaker AA meeting. Pt was notified that group was beginning but remained in bed.   
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
## 2013-06-22 NOTE — BHH Group Notes (Signed)
B[facility] Group Notes: (Clinical Social Work)   06/22/2013      Type of Therapy:  Group Therapy   Participation Level:  Did Not Attend    Ambrose Mantle, LCSW 06/22/2013, 12:54 PM

## 2013-06-22 NOTE — B[facility] Group Notes (Signed)
[facility] Group Notes:  (Nursing/MHT/Case Management/Adjunct)  Date:  06/22/2013  Time:  3:03 PM  Type of Therapy:  Psychoeducational Skills  Participation Level:  Did Not Attend  Buford Dresser 06/22/2013, 3:03 PM

## 2013-06-22 NOTE — Progress Notes (Signed)
Spoke with pt 1:1 who has spent most of the evening in his room resting. Pt not vested in treatment refusing all groups today. Selectively interacts. Still frustrated with lack of pain management but this writer continues to allow patient to voice concerns. CIWA is a 3, VSS. Supported, encouraged to participate and interact more. Denies SI/HI/AVH and remains safe. Lawrence Marseilles

## 2013-06-22 NOTE — Progress Notes (Signed)
[facility] Group Notes:  (Nursing/MHT/Case Management/Adjunct)  Date:  06/22/2013  Time:  6:38 PM  Type of Therapy:  Psychoeducational Skills  Participation Level:  Did Not Attend   Summary of Progress/Problems: Pt stated he was not going to group because he was in too much pain.  Caswell Corwin 06/22/2013, 6:38 PM
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
## 2013-06-23 ENCOUNTER — Telehealth: Payer: Self-pay | Admitting: *Deleted

## 2013-06-23 DIAGNOSIS — F1020 Alcohol dependence, uncomplicated: Secondary | ICD-10-CM

## 2013-06-23 MED ORDER — GABAPENTIN 300 MG PO CAPS: 300.0000 mg | ORAL_CAPSULE | Freq: Three times a day (TID) | ORAL | Status: DC

## 2013-06-23 MED ORDER — LOVASTATIN 40 MG PO TABS: 40.0000 mg | ORAL_TABLET | Freq: Every day | ORAL | Status: DC

## 2013-06-23 MED ORDER — DULOXETINE HCL 20 MG PO CPEP: 20.0000 mg | ORAL_CAPSULE | Freq: Every day | ORAL | Status: DC

## 2013-06-23 MED ORDER — DOXAZOSIN MESYLATE 4 MG PO TABS: 4.0000 mg | ORAL_TABLET | Freq: Every day | ORAL | Status: DC

## 2013-06-23 MED ORDER — ONDANSETRON HCL 4 MG PO TABS: 4.0000 mg | ORAL_TABLET | Freq: Four times a day (QID) | ORAL | Status: DC

## 2013-06-23 MED ORDER — OMEPRAZOLE 20 MG PO CPDR: 20.0000 mg | DELAYED_RELEASE_CAPSULE | Freq: Every day | ORAL | Status: DC

## 2013-06-23 MED ORDER — BACITRACIN-NEOMYCIN-POLYMYXIN 400-5-5000 EX OINT: TOPICAL_OINTMENT | Freq: Every day | CUTANEOUS | Status: DC

## 2013-06-23 MED ORDER — FINASTERIDE 5 MG PO TABS: 5.0000 mg | ORAL_TABLET | Freq: Every day | ORAL | Status: DC

## 2013-06-23 MED ORDER — CEPHALEXIN 500 MG PO CAPS: 500.0000 mg | ORAL_CAPSULE | Freq: Three times a day (TID) | ORAL | Status: DC

## 2013-06-23 NOTE — Progress Notes (Signed)
[facility] Adult Case Management Discharge Plan :  Will you be returning to the same living situation after discharge: Yes,  going back to his tent. At discharge, do you have transportation home?:Yes,  bus pass Do you have the ability to pay for your medications:Yes,  mental health  Release of information consent forms completed and in the chart;  Patient's signature needed at discharge.  Patient to Follow up at: Follow-up Information   Follow up with [facility]. (Walk in between 8AM-9AM Monday through Friday for hospital follow up/medication managment.)    Contact information:   [address], [place] [address] Phone: [phone] Fax: [phone]      Patient denies SI/HI:   Yes,  during admission, group, and self report.     Safety Planning and Suicide Prevention discussed:  Yes,  SPE not required for this pt. He was provided with SPI pamphlet and encouraged to share this information with his support network.   Smart, [name] 06/23/2013, 2:22 PM
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
## 2013-06-23 NOTE — [facility] Suicide Risk Assessment (Signed)
[facility] INPATIENT: Family/Significant Other Suicide Prevention Education  Suicide Prevention Education:  Education Completed; No one has been identified by the patient as the family member/significant other with whom the patient will be residing, and identified as the person(s) who will aid the patient in the event of a mental health crisis (suicidal ideations/suicide attempt).   Pt did not c/o SI at admission, nor have they endorsed SI during their stay here. SPE not required. SPI pamphlet provided to pt and he was encouraged to ask questions, talk about concerns, and share information with his support network.   [employer], LCSWA 06/23/2013   2:22 PM

## 2013-06-23 NOTE — [facility] Group Notes (Signed)
[facility] LCSW Aftercare Discharge Planning Group Note   06/23/2013 9:45 AM  Participation Quality:  DID NOT ATTEND    Smart, [name]

## 2013-06-23 NOTE — Progress Notes (Signed)
Adult Psychoeducational Group Note  Date:  06/23/2013 Time:  1:33 PM  Group Topic/Focus:  Self Care:   The focus of this group is to help patients understand the importance of self-care in order to improve or restore emotional, physical, spiritual, interpersonal, and financial health.  Participation Level:  Did Not Attend  Cathlean Cower 06/23/2013, 1:33 PM

## 2013-06-23 NOTE — Telephone Encounter (Signed)
Pt calls and states he is in detox at [place] and is getting ready to be transferred to another detox facility and needs his 2oxy scripts to take with him?

## 2013-06-23 NOTE — Progress Notes (Signed)
D/C instructions/meds/follow-up appointments reviewed, pt verbalized understanding, pt's belongings returned to pt, samples given, denies SI/HI/AVH 
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
## 2013-06-24 ENCOUNTER — Other Ambulatory Visit: Payer: Self-pay | Admitting: Internal Medicine

## 2013-06-24 ENCOUNTER — Telehealth: Payer: Self-pay | Admitting: *Deleted

## 2013-06-24 MED ORDER — OXYCODONE HCL ER 10 MG PO T12A: 10.0000 mg | EXTENDED_RELEASE_TABLET | Freq: Two times a day (BID) | ORAL | Status: DC

## 2013-06-24 MED ORDER — OXYCODONE HCL 5 MG PO TABS: 5.0000 mg | ORAL_TABLET | ORAL | Status: DC | PRN

## 2013-06-24 NOTE — Telephone Encounter (Signed)
Pt came into clinic for Rx's pick-up.  He was released from [facility] and will be visiting his daughter in another state before he enters another detox in [place].  This is in the process of being set up/per patient. I gave him both Rx but am not sure he can receive them while in detox.  He stated he was not allowed to take them while at [facility]. The number he received was Oxy - ir # 84 and OxyContin # 56.  Its an odd number and not sure if you want to leave it this way. He does have a scheduled appointment with you 10/9

## 2013-06-26 NOTE — Progress Notes (Signed)
Patient Discharge Instructions:  After Visit Summary (AVS):   Faxed to:  06/26/13 Psychiatric Admission Assessment Note:   Faxed to:  06/26/13 Suicide Risk Assessment - Discharge Assessment:   Faxed to:  06/26/13 Faxed/Sent to the Next Level Care provider:  06/26/13 Faxed to [facility] @ [phone]  Jerelene Redden, 06/26/2013, 3:38 PM

## 2013-07-06 ENCOUNTER — Encounter (HOSPITAL_COMMUNITY): Payer: Self-pay | Admitting: *Deleted

## 2013-07-06 ENCOUNTER — Emergency Department (HOSPITAL_COMMUNITY)
Admission: EM | Admit: 2013-07-06 | Discharge: 2013-07-07 | Disposition: A | Discharge status: Home / Self Care | Payer: No Typology Code available for payment source | Attending: Emergency Medicine | Admitting: Emergency Medicine

## 2013-07-06 DIAGNOSIS — M545 Low back pain, unspecified: Secondary | ICD-10-CM | POA: Insufficient documentation

## 2013-07-06 DIAGNOSIS — F17200 Nicotine dependence, unspecified, uncomplicated: Secondary | ICD-10-CM | POA: Insufficient documentation

## 2013-07-06 DIAGNOSIS — G8929 Other chronic pain: Secondary | ICD-10-CM | POA: Insufficient documentation

## 2013-07-06 DIAGNOSIS — R45851 Suicidal ideations: Secondary | ICD-10-CM | POA: Insufficient documentation

## 2013-07-06 DIAGNOSIS — Z8701 Personal history of pneumonia (recurrent): Secondary | ICD-10-CM | POA: Insufficient documentation

## 2013-07-06 DIAGNOSIS — Z8619 Personal history of other infectious and parasitic diseases: Secondary | ICD-10-CM | POA: Insufficient documentation

## 2013-07-06 DIAGNOSIS — Z87448 Personal history of other diseases of urinary system: Secondary | ICD-10-CM | POA: Insufficient documentation

## 2013-07-06 DIAGNOSIS — M129 Arthropathy, unspecified: Secondary | ICD-10-CM | POA: Insufficient documentation

## 2013-07-06 DIAGNOSIS — G479 Sleep disorder, unspecified: Secondary | ICD-10-CM | POA: Insufficient documentation

## 2013-07-06 DIAGNOSIS — Z862 Personal history of diseases of the blood and blood-forming organs and certain disorders involving the immune mechanism: Secondary | ICD-10-CM | POA: Insufficient documentation

## 2013-07-06 DIAGNOSIS — F1010 Alcohol abuse, uncomplicated: Secondary | ICD-10-CM

## 2013-07-06 DIAGNOSIS — K861 Other chronic pancreatitis: Secondary | ICD-10-CM

## 2013-07-06 DIAGNOSIS — Z5900 Homelessness unspecified: Secondary | ICD-10-CM | POA: Insufficient documentation

## 2013-07-06 DIAGNOSIS — Z8709 Personal history of other diseases of the respiratory system: Secondary | ICD-10-CM | POA: Insufficient documentation

## 2013-07-06 DIAGNOSIS — F329 Major depressive disorder, single episode, unspecified: Secondary | ICD-10-CM | POA: Insufficient documentation

## 2013-07-06 DIAGNOSIS — Z8639 Personal history of other endocrine, nutritional and metabolic disease: Secondary | ICD-10-CM | POA: Insufficient documentation

## 2013-07-06 DIAGNOSIS — F10929 Alcohol use, unspecified with intoxication, unspecified: Secondary | ICD-10-CM

## 2013-07-06 DIAGNOSIS — F3289 Other specified depressive episodes: Secondary | ICD-10-CM | POA: Insufficient documentation

## 2013-07-06 DIAGNOSIS — Z8781 Personal history of (healed) traumatic fracture: Secondary | ICD-10-CM | POA: Insufficient documentation

## 2013-07-06 LAB — COMPREHENSIVE METABOLIC PANEL
AST: 119 U/L — ABNORMAL HIGH (ref 0–37)
CO2: 27 mEq/L (ref 19–32)
Calcium: 9 mg/dL (ref 8.4–10.5)
Chloride: 100 mEq/L (ref 96–112)
Creatinine, Ser: 0.82 mg/dL (ref 0.50–1.35)
GFR calc Af Amer: >90 mL/min (ref >90)
GFR calc non Af Amer: >90 mL/min (ref >90)
Glucose, Bld: 96 mg/dL (ref 70–99)
Potassium: 3.8 mEq/L (ref 3.5–5.1)
Total Bilirubin: 0.2 mg/dL — ABNORMAL LOW (ref 0.3–1.2)

## 2013-07-06 LAB — CBC
HCT: 39.6 % (ref 39.0–52.0)
Hemoglobin: 13.4 g/dL (ref 13.0–17.0)
MCH: 33.3 pg (ref 26.0–34.0)
MCHC: 33.8 g/dL (ref 30.0–36.0)
MCV: 98.3 fL (ref 78.0–100.0)
Platelets: 299 10*3/uL (ref 150–400)
RBC: 4.03 MIL/uL — ABNORMAL LOW (ref 4.22–5.81)
WBC: 7.7 10*3/uL (ref 4.0–10.5)

## 2013-07-06 LAB — RAPID URINE DRUG SCREEN, HOSP PERFORMED
Amphetamines: NOT DETECTED
Barbiturates: NOT DETECTED
Benzodiazepines: NOT DETECTED
Tetrahydrocannabinol: NOT DETECTED

## 2013-07-06 LAB — LIPASE, BLOOD: Lipase: 44 U/L (ref 11–59)

## 2013-07-06 LAB — SALICYLATE LEVEL: Salicylate Lvl: <2 mg/dL — ABNORMAL LOW (ref 2.8–20.0)

## 2013-07-06 MED ORDER — HYDROMORPHONE HCL PF 1 MG/ML IJ SOLN
1.0000 mg | Freq: Once | INTRAMUSCULAR | Status: AC
Start: 2013-07-06 — End: 2013-07-06
  Administered 2013-07-06: 1 mg via INTRAVENOUS
  Filled 2013-07-06: qty 1

## 2013-07-06 MED ORDER — ONDANSETRON HCL 4 MG/2ML IJ SOLN
4.0000 mg | Freq: Once | INTRAMUSCULAR | Status: AC
  Administered 2013-07-06: 4 mg via INTRAVENOUS
  Filled 2013-07-06: qty 2

## 2013-07-06 MED ORDER — SODIUM CHLORIDE 0.9 % IV BOLUS (SEPSIS)
1000.0000 mL | INTRAVENOUS | Status: AC
  Administered 2013-07-06: 1000 mL via INTRAVENOUS

## 2013-07-06 MED ORDER — HYDROMORPHONE HCL PF 1 MG/ML IJ SOLN
1.0000 mg | Freq: Once | INTRAMUSCULAR | Status: AC
  Administered 2013-07-06: 1 mg via INTRAVENOUS
  Filled 2013-07-06: qty 1
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
## 2013-07-06 NOTE — ED Notes (Addendum)
Pt states he is looking for help from alcohol abuse, and is having lower abd pain.  Pt states he is having throught of suicide and is planning on "taking a syringe and shooting gas into myself."

## 2013-07-06 NOTE — ED Notes (Signed)
AC and Chrage made aware of need for sitter.  Per PT, belonging will be taken with friend Kinyon).

## 2013-07-06 NOTE — ED Notes (Signed)
Call friend, Penni Bombard, upon discharge at [phone].

## 2013-07-07 ENCOUNTER — Encounter (HOSPITAL_COMMUNITY): Payer: Self-pay | Admitting: Licensed Clinical Social Worker

## 2013-07-07 MED ORDER — ONDANSETRON HCL 4 MG/2ML IJ SOLN
4.0000 mg | Freq: Once | INTRAMUSCULAR | Status: AC
  Administered 2013-07-07: 4 mg via INTRAVENOUS
  Filled 2013-07-07: qty 2

## 2013-07-07 MED ORDER — HYDROCODONE-ACETAMINOPHEN 5-325 MG PO TABS
2.0000 | ORAL_TABLET | Freq: Once | ORAL | Status: AC
  Administered 2013-07-07: 2 via ORAL
  Filled 2013-07-07: qty 2

## 2013-07-07 MED ORDER — SODIUM CHLORIDE 0.9 % IV BOLUS (SEPSIS): 1000.0000 mL | INTRAVENOUS | Status: DC

## 2013-07-07 MED ORDER — HYDROMORPHONE HCL PF 1 MG/ML IJ SOLN
1.0000 mg | Freq: Once | INTRAMUSCULAR | Status: AC
Start: 2013-07-07 — End: 2013-07-07
  Administered 2013-07-07: 1 mg via INTRAVENOUS
  Filled 2013-07-07: qty 1
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
## 2013-07-07 NOTE — BHH Counselor (Signed)
Per conversation with Dr. Denton Lank (EDP), patient to be discharged today. Per Dr. Norman Herrlich note patient is stable for d/c. Dr. Denton Lank also notes that he will encourage patient to follow up with PCP and outpatient mental health closely. No further dis positioning needed from TS/TA.

## 2013-07-07 NOTE — ED Provider Notes (Signed)
Pt alert, content, nad. Vitals normal. Pt w normal mood/affect.  Pt denies any thoughts of harm to self or others.  Pt appears stable for d/c. Will encourage to follow up with pcp and outpt mental health closely.    Suzi Roots, MD 07/07/13 [phone]

## 2013-07-07 NOTE — ED Notes (Signed)
Pt took shower, brushed hair and teeth.  Very pleasant and understanding of our procedures.

## 2013-07-07 NOTE — ED Notes (Signed)
Telepsych monitor is at the bedside.

## 2013-07-07 NOTE — ED Provider Notes (Signed)
Medical screening examination/treatment/procedure(s) were performed by non-physician practitioner and as supervising physician I was immediately available for consultation/collaboration.   William Prentiss Polio, MD 07/07/13 1836 

## 2013-07-08 ENCOUNTER — Encounter (HOSPITAL_COMMUNITY): Payer: Self-pay | Admitting: Emergency Medicine

## 2013-07-08 ENCOUNTER — Emergency Department (HOSPITAL_COMMUNITY)
Admission: EM | Admit: 2013-07-08 | Discharge: 2013-07-08 | Disposition: A | Discharge status: Home / Self Care | Payer: No Typology Code available for payment source | Attending: Emergency Medicine | Admitting: Emergency Medicine

## 2013-07-08 DIAGNOSIS — Z8619 Personal history of other infectious and parasitic diseases: Secondary | ICD-10-CM | POA: Insufficient documentation

## 2013-07-08 DIAGNOSIS — Z8701 Personal history of pneumonia (recurrent): Secondary | ICD-10-CM | POA: Insufficient documentation

## 2013-07-08 DIAGNOSIS — R197 Diarrhea, unspecified: Secondary | ICD-10-CM

## 2013-07-08 DIAGNOSIS — R109 Unspecified abdominal pain: Secondary | ICD-10-CM

## 2013-07-08 DIAGNOSIS — R112 Nausea with vomiting, unspecified: Secondary | ICD-10-CM | POA: Insufficient documentation

## 2013-07-08 DIAGNOSIS — Z8739 Personal history of other diseases of the musculoskeletal system and connective tissue: Secondary | ICD-10-CM | POA: Insufficient documentation

## 2013-07-08 DIAGNOSIS — G8929 Other chronic pain: Secondary | ICD-10-CM | POA: Insufficient documentation

## 2013-07-08 DIAGNOSIS — F17200 Nicotine dependence, unspecified, uncomplicated: Secondary | ICD-10-CM | POA: Insufficient documentation

## 2013-07-08 DIAGNOSIS — Z87448 Personal history of other diseases of urinary system: Secondary | ICD-10-CM | POA: Insufficient documentation

## 2013-07-08 DIAGNOSIS — Z8639 Personal history of other endocrine, nutritional and metabolic disease: Secondary | ICD-10-CM | POA: Insufficient documentation

## 2013-07-08 DIAGNOSIS — Z8781 Personal history of (healed) traumatic fracture: Secondary | ICD-10-CM | POA: Insufficient documentation

## 2013-07-08 DIAGNOSIS — Z8709 Personal history of other diseases of the respiratory system: Secondary | ICD-10-CM | POA: Insufficient documentation

## 2013-07-08 DIAGNOSIS — Z8719 Personal history of other diseases of the digestive system: Secondary | ICD-10-CM | POA: Insufficient documentation

## 2013-07-08 DIAGNOSIS — Z862 Personal history of diseases of the blood and blood-forming organs and certain disorders involving the immune mechanism: Secondary | ICD-10-CM | POA: Insufficient documentation

## 2013-07-08 DIAGNOSIS — Z8659 Personal history of other mental and behavioral disorders: Secondary | ICD-10-CM | POA: Insufficient documentation

## 2013-07-08 DIAGNOSIS — Z9089 Acquired absence of other organs: Secondary | ICD-10-CM | POA: Insufficient documentation

## 2013-07-08 DIAGNOSIS — R1084 Generalized abdominal pain: Secondary | ICD-10-CM | POA: Insufficient documentation

## 2013-07-08 LAB — COMPREHENSIVE METABOLIC PANEL
AST: 89 U/L — ABNORMAL HIGH (ref 0–37)
Albumin: 3.7 g/dL (ref 3.5–5.2)
Alkaline Phosphatase: 94 U/L (ref 39–117)
BUN: 11 mg/dL (ref 6–23)
CO2: 25 mEq/L (ref 19–32)
Chloride: 99 mEq/L (ref 96–112)
GFR calc non Af Amer: >90 mL/min (ref >90)
Potassium: 3.8 mEq/L (ref 3.5–5.1)
Total Bilirubin: 0.7 mg/dL (ref 0.3–1.2)

## 2013-07-08 LAB — URINALYSIS, ROUTINE W REFLEX MICROSCOPIC
Hgb urine dipstick: NEGATIVE
Protein, ur: 100 mg/dL — AB
Specific Gravity, Urine: 1.023 (ref 1.005–1.030)
pH: 7.5 (ref 5.0–8.0)

## 2013-07-08 LAB — CBC WITH DIFFERENTIAL/PLATELET
Basophils Absolute: 0 10*3/uL (ref 0.0–0.1)
Basophils Relative: 0 % (ref 0–1)
Eosinophils Absolute: 0 10*3/uL (ref 0.0–0.7)
HCT: 42.1 % (ref 39.0–52.0)
Hemoglobin: 14.9 g/dL (ref 13.0–17.0)
Lymphocytes Relative: 18 % (ref 12–46)
MCHC: 35.4 g/dL (ref 30.0–36.0)
MCV: 98.1 fL (ref 78.0–100.0)
Monocytes Relative: 4 % (ref 3–12)
Neutro Abs: 6.1 10*3/uL (ref 1.7–7.7)
Neutrophils Relative %: 78 % — ABNORMAL HIGH (ref 43–77)
RBC: 4.29 MIL/uL (ref 4.22–5.81)
RDW: 13.2 % (ref 11.5–15.5)
WBC: 7.9 10*3/uL (ref 4.0–10.5)

## 2013-07-08 LAB — URINE MICROSCOPIC-ADD ON

## 2013-07-08 MED ORDER — PROMETHAZINE HCL 25 MG/ML IJ SOLN
25.0000 mg | Freq: Once | INTRAMUSCULAR | Status: AC
  Administered 2013-07-08: 25 mg via INTRAVENOUS
  Filled 2013-07-08: qty 1

## 2013-07-08 MED ORDER — MORPHINE SULFATE 4 MG/ML IJ SOLN
4.0000 mg | Freq: Once | INTRAMUSCULAR | Status: AC
  Administered 2013-07-08: 4 mg via INTRAVENOUS
  Filled 2013-07-08: qty 1

## 2013-07-08 MED ORDER — ONDANSETRON HCL 4 MG/2ML IJ SOLN
4.0000 mg | Freq: Once | INTRAMUSCULAR | Status: AC
  Administered 2013-07-08: 4 mg via INTRAVENOUS
  Filled 2013-07-08: qty 2

## 2013-07-08 MED ORDER — DIPHENOXYLATE-ATROPINE 2.5-0.025 MG PO TABS: 1.0000 | ORAL_TABLET | Freq: Four times a day (QID) | ORAL | Status: DC | PRN

## 2013-07-08 MED ORDER — HYDROMORPHONE HCL PF 1 MG/ML IJ SOLN
1.0000 mg | Freq: Once | INTRAMUSCULAR | Status: AC
  Administered 2013-07-08: 1 mg via INTRAVENOUS
  Filled 2013-07-08: qty 1

## 2013-07-08 MED ORDER — DICYCLOMINE HCL 20 MG PO TABS: 20.0000 mg | ORAL_TABLET | Freq: Two times a day (BID) | ORAL | Status: DC

## 2013-07-08 NOTE — ED Notes (Signed)
abd pain and diarrhea sincwe yesterday came to er yesterday for same pain
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
## 2013-07-08 NOTE — ED Notes (Signed)
Still c/o nausea and pain new meds ordered and requested from pharm

## 2013-07-08 NOTE — ED Provider Notes (Signed)
Medical screening examination/treatment/procedure(s) were performed by non-physician practitioner and as supervising physician I was immediately available for consultation/collaboration.   Celene Kras, MD 07/08/13 1324

## 2013-07-10 ENCOUNTER — Encounter: Payer: No Typology Code available for payment source | Admitting: Internal Medicine

## 2013-07-10 ENCOUNTER — Observation Stay (HOSPITAL_COMMUNITY)
Admission: AD | Admit: 2013-07-10 | Discharge: 2013-07-13 | DRG: 440 | Disposition: A | Discharge status: Home / Self Care | Payer: No Typology Code available for payment source | Source: Ambulatory Visit | Attending: Internal Medicine | Admitting: Internal Medicine

## 2013-07-10 ENCOUNTER — Ambulatory Visit (INDEPENDENT_AMBULATORY_CARE_PROVIDER_SITE_OTHER): Payer: No Typology Code available for payment source | Admitting: Internal Medicine

## 2013-07-10 ENCOUNTER — Encounter (HOSPITAL_COMMUNITY): Payer: Self-pay | Admitting: General Practice

## 2013-07-10 ENCOUNTER — Telehealth: Payer: Self-pay | Admitting: *Deleted

## 2013-07-10 ENCOUNTER — Encounter: Payer: Self-pay | Admitting: Internal Medicine

## 2013-07-10 ENCOUNTER — Ambulatory Visit (HOSPITAL_COMMUNITY)
Admission: RE | Admit: 2013-07-10 | Discharge: 2013-07-10 | Disposition: A | Discharge status: Home / Self Care | Payer: No Typology Code available for payment source | Source: Ambulatory Visit | Attending: Internal Medicine | Admitting: Internal Medicine

## 2013-07-10 VITALS — BP 112/62 | HR 82 | Temp 96.3°F | Ht 70.0 in | Wt 145.9 lb

## 2013-07-10 DIAGNOSIS — Z5900 Homelessness unspecified: Secondary | ICD-10-CM

## 2013-07-10 DIAGNOSIS — R197 Diarrhea, unspecified: Secondary | ICD-10-CM

## 2013-07-10 DIAGNOSIS — K859 Acute pancreatitis without necrosis or infection, unspecified: Secondary | ICD-10-CM

## 2013-07-10 DIAGNOSIS — R079 Chest pain, unspecified: Secondary | ICD-10-CM

## 2013-07-10 DIAGNOSIS — R112 Nausea with vomiting, unspecified: Secondary | ICD-10-CM

## 2013-07-10 DIAGNOSIS — F1020 Alcohol dependence, uncomplicated: Secondary | ICD-10-CM

## 2013-07-10 DIAGNOSIS — R109 Unspecified abdominal pain: Secondary | ICD-10-CM | POA: Insufficient documentation

## 2013-07-10 DIAGNOSIS — Z23 Encounter for immunization: Secondary | ICD-10-CM

## 2013-07-10 DIAGNOSIS — R638 Other symptoms and signs concerning food and fluid intake: Secondary | ICD-10-CM

## 2013-07-10 DIAGNOSIS — N400 Enlarged prostate without lower urinary tract symptoms: Secondary | ICD-10-CM

## 2013-07-10 DIAGNOSIS — I498 Other specified cardiac arrhythmias: Secondary | ICD-10-CM

## 2013-07-10 DIAGNOSIS — R42 Dizziness and giddiness: Secondary | ICD-10-CM

## 2013-07-10 DIAGNOSIS — R001 Bradycardia, unspecified: Secondary | ICD-10-CM

## 2013-07-10 DIAGNOSIS — I959 Hypotension, unspecified: Secondary | ICD-10-CM

## 2013-07-10 DIAGNOSIS — K529 Noninfective gastroenteritis and colitis, unspecified: Secondary | ICD-10-CM

## 2013-07-10 DIAGNOSIS — K5289 Other specified noninfective gastroenteritis and colitis: Secondary | ICD-10-CM

## 2013-07-10 DIAGNOSIS — B1920 Unspecified viral hepatitis C without hepatic coma: Secondary | ICD-10-CM

## 2013-07-10 DIAGNOSIS — G894 Chronic pain syndrome: Secondary | ICD-10-CM

## 2013-07-10 HISTORY — DX: Headache: R51

## 2013-07-10 LAB — COMPLETE METABOLIC PANEL WITH GFR
Alkaline Phosphatase: 75 U/L (ref 39–117)
BUN: 13 mg/dL (ref 6–23)
Creat: 0.65 mg/dL (ref 0.50–1.35)
GFR, Est Non African American: >89 mL/min
Glucose, Bld: 124 mg/dL — ABNORMAL HIGH (ref 70–99)
Sodium: 138 mEq/L (ref 135–145)
Total Bilirubin: 0.5 mg/dL (ref 0.3–1.2)

## 2013-07-10 LAB — CREATININE, SERUM
Creatinine, Ser: 0.69 mg/dL (ref 0.50–1.35)
GFR calc non Af Amer: >90 mL/min (ref >90)

## 2013-07-10 LAB — CBC
MCH: 32.9 pg (ref 26.0–34.0)
MCHC: 33.7 g/dL (ref 30.0–36.0)
RDW: 12.9 % (ref 11.5–15.5)

## 2013-07-10 LAB — LIPASE: Lipase: 72 U/L — ABNORMAL HIGH (ref 11–59)

## 2013-07-10 LAB — TROPONIN I: Troponin I: <0.3 ng/mL (ref <0.30)

## 2013-07-10 MED ORDER — OXYCODONE HCL 5 MG PO TABS: 5.0000 mg | ORAL_TABLET | ORAL | Status: DC | PRN

## 2013-07-10 MED ORDER — ACETAMINOPHEN 650 MG RE SUPP: 650.0000 mg | Freq: Four times a day (QID) | RECTAL | Status: DC | PRN

## 2013-07-10 MED ORDER — ENOXAPARIN SODIUM 30 MG/0.3ML ~~LOC~~ SOLN
30.0000 mg | SUBCUTANEOUS | Status: DC
  Filled 2013-07-10: qty 0.3

## 2013-07-10 MED ORDER — SODIUM CHLORIDE 0.9 % IV BOLUS (SEPSIS)
1000.0000 mL | Freq: Once | INTRAVENOUS | Status: AC
  Administered 2013-07-10: 1000 mL via INTRAVENOUS

## 2013-07-10 MED ORDER — SODIUM CHLORIDE 0.9 % IJ SOLN
3.0000 mL | Freq: Two times a day (BID) | INTRAMUSCULAR | Status: DC
  Administered 2013-07-11 – 2013-07-12 (×2): 3 mL via INTRAVENOUS

## 2013-07-10 MED ORDER — POTASSIUM CHLORIDE CRYS ER 20 MEQ PO TBCR: 40.0000 meq | EXTENDED_RELEASE_TABLET | Freq: Once | ORAL | Status: DC

## 2013-07-10 MED ORDER — THIAMINE HCL 100 MG/ML IJ SOLN
100.0000 mg | Freq: Every day | INTRAMUSCULAR | Status: DC
  Administered 2013-07-10 – 2013-07-11 (×2): 100 mg via INTRAVENOUS
  Filled 2013-07-10 (×3): qty 1

## 2013-07-10 MED ORDER — PROMETHAZINE HCL 25 MG/ML IJ SOLN
12.5000 mg | Freq: Four times a day (QID) | INTRAMUSCULAR | Status: DC | PRN
  Administered 2013-07-10 – 2013-07-11 (×3): 12.5 mg via INTRAVENOUS
  Filled 2013-07-10 (×3): qty 1

## 2013-07-10 MED ORDER — POTASSIUM CHLORIDE 10 MEQ/100ML IV SOLN
10.0000 meq | INTRAVENOUS | Status: AC
  Administered 2013-07-10 – 2013-07-11 (×4): 10 meq via INTRAVENOUS
  Filled 2013-07-10 (×4): qty 100

## 2013-07-10 MED ORDER — PROMETHAZINE HCL 12.5 MG PO TABS
25.0000 mg | ORAL_TABLET | Freq: Once | ORAL | Status: AC
  Administered 2013-07-10: 25 mg via ORAL

## 2013-07-10 MED ORDER — ONDANSETRON 4 MG PO TBDP
4.0000 mg | ORAL_TABLET | Freq: Once | ORAL | Status: AC
  Administered 2013-07-10: 4 mg via ORAL
  Filled 2013-07-10: qty 1

## 2013-07-10 MED ORDER — ONDANSETRON HCL 4 MG/2ML IJ SOLN
4.0000 mg | Freq: Four times a day (QID) | INTRAMUSCULAR | Status: DC | PRN
  Administered 2013-07-10: 4 mg via INTRAVENOUS
  Filled 2013-07-10: qty 2

## 2013-07-10 MED ORDER — PROMETHAZINE HCL 12.5 MG PO TABS
12.5000 mg | ORAL_TABLET | Freq: Four times a day (QID) | ORAL | Status: DC | PRN
  Filled 2013-07-10: qty 1

## 2013-07-10 MED ORDER — FOLIC ACID 5 MG/ML IJ SOLN
1.0000 mg | Freq: Every day | INTRAMUSCULAR | Status: DC
  Administered 2013-07-10 – 2013-07-11 (×2): 1 mg via INTRAVENOUS
  Filled 2013-07-10 (×3): qty 0.2

## 2013-07-10 MED ORDER — ACETAMINOPHEN 325 MG PO TABS: 650.0000 mg | ORAL_TABLET | Freq: Four times a day (QID) | ORAL | Status: DC | PRN

## 2013-07-10 MED ORDER — ONDANSETRON HCL 4 MG PO TABS: 4.0000 mg | ORAL_TABLET | Freq: Four times a day (QID) | ORAL | Status: DC | PRN

## 2013-07-10 MED ORDER — HYDROMORPHONE HCL PF 1 MG/ML IJ SOLN
0.5000 mg | INTRAMUSCULAR | Status: DC | PRN
  Administered 2013-07-10 – 2013-07-11 (×3): 0.5 mg via INTRAVENOUS
  Filled 2013-07-10 (×3): qty 1

## 2013-07-10 MED ORDER — MORPHINE SULFATE 2 MG/ML IJ SOLN
1.0000 mg | INTRAMUSCULAR | Status: DC | PRN
  Administered 2013-07-10: 1 mg via INTRAVENOUS
  Filled 2013-07-10: qty 1

## 2013-07-10 MED ORDER — SODIUM CHLORIDE 0.9 % IV SOLN
INTRAVENOUS | Status: DC
  Administered 2013-07-10 – 2013-07-11 (×3): via INTRAVENOUS

## 2013-07-10 MED ORDER — ASPIRIN EC 81 MG PO TBEC
81.0000 mg | DELAYED_RELEASE_TABLET | Freq: Every day | ORAL | Status: DC
  Administered 2013-07-11 – 2013-07-13 (×3): 81 mg via ORAL
  Filled 2013-07-10 (×3): qty 1

## 2013-07-10 NOTE — Assessment & Plan Note (Signed)
EKG indicates sinus bradycardia Monitor via tele

## 2013-07-10 NOTE — Assessment & Plan Note (Signed)
Likely due to acute illness and nausea, vomiting IVF Advance diet as tolerated
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
## 2013-07-10 NOTE — Assessment & Plan Note (Signed)
BP was 97/64.  Normally pt is hypertensive.  He may be hypotensive 2/2 decreased po intake and dehydration Will need IVF for hydration

## 2013-07-10 NOTE — Assessment & Plan Note (Signed)
Reproducible likely MSK.  No acute ST changes on EKG though pt has sinus brady Monitor via tele  Stat trop x 1. Consider trending x 2 more occurences  Consider GI cocktail thought doubt related to GERD as pt is s/p Nissen

## 2013-07-10 NOTE — Assessment & Plan Note (Signed)
Orthostatics were negative  May be secondary to dehydration with decreased po intake, nausea/vomiting/diarrhea plus outpatient medications  IVF

## 2013-07-10 NOTE — Assessment & Plan Note (Signed)
Consider gastroenteritis.  He denies drinking outdoor water  Consider GI pathogen panel

## 2013-07-10 NOTE — Assessment & Plan Note (Signed)
Denies alcohol in 7 days

## 2013-07-10 NOTE — Assessment & Plan Note (Signed)
Ddx: A/C pancreatitis (less likely as pt had lipase on 10/7 negative), viral gastroenteritis Recent ED visit 10/7 with wnl labs except AST and ALT elevated but trending down. AAS wnl in 05/2013. CT abdomen no acute findings 03/2013.   Phenerghan 25 mg x 1 given in clinic  CMET stat, trop stat

## 2013-07-10 NOTE — Assessment & Plan Note (Addendum)
Generalized. May be secondary to acute gastroenteritis, acute diarrhea with vomiting.  He has a h/o pancreatitis  Pain control, IVF, consider GI pathogen study  Pt has h/o BPH like sx's and has not been able to afford BPH meds.  Consider UA and bladder scan, PVR if needed. Monitor strict intake and output to monitor urine output  Pending CMET stat, trop x 1 stat, lipase

## 2013-07-10 NOTE — Telephone Encounter (Signed)
Pt called has appt today in clinic - c/o of nausea and pain right leg at old surgical site. Was at ER 07/08/13 for nausea  and diarrhea. Pt states little change. Suggest to keep appt today - pt states he had to go and hung up. Karlon Schlafer RN 10.9/14 11AM

## 2013-07-10 NOTE — Assessment & Plan Note (Signed)
Will need social work and case management involved to try to look for housing currently living in the woods in a tent and cant afford medications

## 2013-07-10 NOTE — Progress Notes (Addendum)
Subjective:    Patient ID: [name], male    DOB: 1955/11/22, 57 y.o.   MRN: [id number]  HPI Comments: 57 y.o PMH alcohol abuse/dependence (denies use in 7 days, h/o alcohol detox (x 2-3 weeks ago), dyslipidemia, HCV, hyperthyroidism, pancreatitis, chronic pain (left shoulder, right knee), h/o tinnitis.  He presents with a few days (~3 days) history of nausea, vomiting (clear fluid), diarrhea (3 x today already watery brown), sweating, hot flashes, generalized abdominal pain (ab pain worse since 1 am; CMET wnl except AST and ALT elevated but trending down and lipase negative), decreased oral intake.  He has been feeling dizzy with blurry eyes, weak as well.  Orthostatics lying 109/70 (59), sitting 103/70 (68), standing 97/64 (79) indicate he is not orthostatic.  He also complains of 2-3 weeks of numbness and tingling in his b/l lower feet and left hand (B12 and folate wnl).  He is still complaining of difficulty peeing and emptying his bladder but has not been able to afford Cardura and Proscar. He also mentions he has been having nose bleeding with blowing his nose (recent CBC was wnl 07/08/13).  He has been having left chest pain x 3 days which is intermittent.  He denies chest pain being related to heartburn b/c he is s/p Nissen and has not had problems since with heartburn.  Chest pain is sharp 10/10.    For his chronic pain Oxycotin makes him nauseated and Oxycodone IR 5 mg has been helping with his left shoulder pain. He has right knee pain not relieved by his pain medication.      SH: He has been staying in the woods under a tent.  Previously his ex wife was paying for him to stay in a boarding house but when her current husband found out she had to stop payments.  He has 2 children a daughter in [place] and son who is back and forth to [place].      ROS per HPI     Review of Systems  Constitutional: Positive for chills.       Subjective fever   Respiratory: Negative for shortness of  breath.        Objective:   Physical Exam  Nursing note and vitals reviewed. Constitutional: He is oriented to person, place, and time. He appears well-developed and well-nourished. He is cooperative.  Thin Dry heaving   HENT:  Head: Normocephalic and atraumatic.  Mouth/Throat: Abnormal dentition. No oropharyngeal exudate.  Eyes: Conjunctivae are normal. Pupils are equal, round, and reactive to light. Right eye exhibits no discharge. Left eye exhibits no discharge. No scleral icterus.  Cardiovascular: Regular rhythm, S1 normal, S2 normal and normal heart sounds.  Bradycardia present.   No murmur heard. No lower extremity edema  Pulmonary/Chest: Effort normal and breath sounds normal. He exhibits tenderness.  Left chest wall ttp   Abdominal: Soft. Bowel sounds are normal. He exhibits no distension.  Generalized ttp (mod)  Musculoskeletal: He exhibits no edema.  Neurological: He is alert and oriented to person, place, and time.  CN 2-12 grossly neurologically intact  Gait not assessed   Skin: Skin is warm, dry and intact. No rash noted.  Few abrasions to face   Psychiatric: He has a normal mood and affect. His speech is normal and behavior is normal. Judgment and thought content normal. Cognition and memory are normal.          Assessment & Plan:  Admit to hospital for hypotension, decreased po, nausea/vomiting/diarrhea, abdominal  pain, sinus bradycardia  Needs IVF, Consider GI pathogen PCR, pain control, monitor via tele  Phenerghan 25 mg x 1 given in clinic, CMET, trop, and EKG obtained.  May benefit from social work and case manager consult for homelessness and needing medications.

## 2013-07-11 ENCOUNTER — Other Ambulatory Visit: Payer: Self-pay

## 2013-07-11 ENCOUNTER — Inpatient Hospital Stay (HOSPITAL_COMMUNITY): Payer: No Typology Code available for payment source

## 2013-07-11 LAB — RAPID URINE DRUG SCREEN, HOSP PERFORMED
Amphetamines: NOT DETECTED
Barbiturates: NOT DETECTED
Benzodiazepines: POSITIVE — AB
Cocaine: NOT DETECTED
Tetrahydrocannabinol: NOT DETECTED

## 2013-07-11 LAB — BASIC METABOLIC PANEL
BUN: 12 mg/dL (ref 6–23)
Creatinine, Ser: 0.7 mg/dL (ref 0.50–1.35)
GFR calc Af Amer: >90 mL/min (ref >90)
GFR calc non Af Amer: >90 mL/min (ref >90)
Glucose, Bld: 106 mg/dL — ABNORMAL HIGH (ref 70–99)
Sodium: 138 mEq/L (ref 135–145)

## 2013-07-11 LAB — URINALYSIS, ROUTINE W REFLEX MICROSCOPIC
Bilirubin Urine: NEGATIVE
Nitrite: NEGATIVE
Protein, ur: NEGATIVE mg/dL
Specific Gravity, Urine: 1.022 (ref 1.005–1.030)
Urobilinogen, UA: 1 mg/dL (ref 0.0–1.0)

## 2013-07-11 LAB — VITAMIN B12: Vitamin B-12: 634 pg/mL (ref 211–911)

## 2013-07-11 LAB — HIV ANTIBODY (ROUTINE TESTING W REFLEX): HIV: NONREACTIVE

## 2013-07-11 LAB — FOLATE: Folate: >20 ng/mL

## 2013-07-11 LAB — TROPONIN I
Troponin I: <0.3 ng/mL (ref <0.30)
Troponin I: <0.3 ng/mL (ref <0.30)

## 2013-07-11 MED ORDER — ONDANSETRON HCL 4 MG/2ML IJ SOLN
4.0000 mg | Freq: Three times a day (TID) | INTRAMUSCULAR | Status: DC | PRN
Start: 2013-07-11 — End: 2013-07-12
  Administered 2013-07-12: 4 mg via INTRAMUSCULAR
  Filled 2013-07-11 (×2): qty 2

## 2013-07-11 MED ORDER — ONDANSETRON 4 MG PO TBDP
4.0000 mg | ORAL_TABLET | Freq: Three times a day (TID) | ORAL | Status: DC | PRN
  Administered 2013-07-11: 4 mg via ORAL
  Filled 2013-07-11 (×2): qty 1

## 2013-07-11 MED ORDER — ONDANSETRON 4 MG PO TBDP
4.0000 mg | ORAL_TABLET | Freq: Four times a day (QID) | ORAL | Status: DC
  Filled 2013-07-11 (×3): qty 1

## 2013-07-11 MED ORDER — FAMOTIDINE 20 MG PO TABS
20.0000 mg | ORAL_TABLET | Freq: Two times a day (BID) | ORAL | Status: DC
  Administered 2013-07-11 – 2013-07-13 (×6): 20 mg via ORAL
  Filled 2013-07-11 (×6): qty 1

## 2013-07-11 MED ORDER — BOOST / RESOURCE BREEZE PO LIQD
1.0000 | Freq: Three times a day (TID) | ORAL | Status: DC
  Administered 2013-07-11 – 2013-07-13 (×6): 1 via ORAL

## 2013-07-11 MED ORDER — OXYCODONE HCL ER 10 MG PO T12A
10.0000 mg | EXTENDED_RELEASE_TABLET | Freq: Two times a day (BID) | ORAL | Status: DC
  Administered 2013-07-12 – 2013-07-13 (×3): 10 mg via ORAL
  Filled 2013-07-11 (×4): qty 1

## 2013-07-11 MED ORDER — OXYCODONE HCL 5 MG PO TABS
5.0000 mg | ORAL_TABLET | ORAL | Status: DC | PRN
  Administered 2013-07-11 – 2013-07-13 (×10): 5 mg via ORAL
  Filled 2013-07-11 (×10): qty 1

## 2013-07-11 MED ORDER — ONDANSETRON 4 MG PO TBDP
4.0000 mg | ORAL_TABLET | ORAL | Status: DC
  Administered 2013-07-11: 4 mg via ORAL
  Filled 2013-07-11 (×6): qty 1

## 2013-07-11 MED ORDER — ENOXAPARIN SODIUM 40 MG/0.4ML ~~LOC~~ SOLN
40.0000 mg | SUBCUTANEOUS | Status: DC
  Administered 2013-07-11 – 2013-07-12 (×2): 40 mg via SUBCUTANEOUS
  Filled 2013-07-11 (×3): qty 0.4

## 2013-07-11 NOTE — Progress Notes (Signed)
Medical Student Daily Progress Note  Subjective: Pt is not feeling very well. Has nausea. No appetite at all. Chest pain resolved with morphine but c/o generalized abdominal pain. Afebrile T amx, 99.3. Bradycardic 52-58.   Objective: Vital signs in last 24 hours: Filed Vitals:   07/10/13 1812 07/10/13 1920 07/10/13 2033 07/11/13 0500  BP: 113/64  118/69 122/73  Pulse: 51  61 58  Temp: 99.3 F (37.4 C)  98.6 F (37 C) 98.8 F (37.1 C)  TempSrc: Oral  Oral Oral  Resp: 16  18 18   Height:  5\' 10"  (1.778 m)    Weight:  66.18 kg (145 lb 14.4 oz)    SpO2: 99%  99% 97%   Weight change:   Intake/Output Summary (Last 24 hours) at 07/11/13 1121 Last data filed at 07/11/13 0900  Gross per 24 hour  Intake 2136.67 ml  Output    850 ml  Net 1286.67 ml   Physical Exam: General: resting in bed HEENT: PERRL, EOMI, no scleral icterus Cardiac: bradycardic, no rubs, murmurs or gallops Pulm: clear to auscultation bilaterally, moving normal volumes of air Abd: soft, nontender with palpationr, nondistended, BS present Ext: warm and well perfused, no pedal edema Neuro: numbnessin b/l lower feet and left hand but gross motor and sensory intact  alert and oriented X3, cranial nerves II-XII grossly intact  Lab Results: Basic Metabolic Panel:  Recent Labs Lab 07/10/13 1653 07/10/13 2015 07/11/13 0625  NA 138  --  138  K 3.3*  --  3.6  CL 102  --  105  CO2 24  --  23  GLUCOSE 124*  --  106*  BUN 13  --  12  CREATININE 0.65 0.69 0.70  CALCIUM 8.7  --  8.5  MG  --  1.9  --    Liver Function Tests:  Recent Labs Lab 07/08/13 0930 07/10/13 1653  AST 89* 62*  ALT 98* 86*  ALKPHOS 94 75  BILITOT 0.7 0.5  PROT 8.2 7.2  ALBUMIN 3.7 3.4*    Recent Labs Lab 07/08/13 0930 07/10/13 1653  LIPASE 38 72*   CBC:  Recent Labs Lab 07/08/13 0930 07/10/13 2015  WBC 7.9 7.7  NEUTROABS 6.1  --   HGB 14.9 14.0  HCT 42.1 41.5  MCV 98.1 97.6  PLT 300 262   Cardiac  Enzymes:  Recent Labs Lab 07/10/13 0100 07/10/13 1653 07/11/13 0625  TROPONINI <0.30 <0.30 <0.30   BNP: No results found for this basename: PROBNP,  in the last 168 hours D-Dimer: No results found for this basename: DDIMER,  in the last 168 hours CBG: No results found for this basename: GLUCAP,  in the last 168 hours Hemoglobin A1C: No results found for this basename: HGBA1C,  in the last 168 hours Fasting Lipid Panel: No results found for this basename: CHOL, HDL, LDLCALC, TRIG, CHOLHDL, LDLDIRECT,  in the last 168 hours Thyroid Function Tests: No results found for this basename: TSH, T4TOTAL, FREET4, T3FREE, THYROIDAB,  in the last 168 hours Coagulation: No results found for this basename: LABPROT, INR,  in the last 168 hours Anemia Panel: No results found for this basename: VITAMINB12, FOLATE, FERRITIN, TIBC, IRON, RETICCTPCT,  in the last 168 hours Urine Drug Screen: Drugs of Abuse     Component Value Date/Time   LABOPIA POSITIVE* 07/11/2013 0305   LABOPIA PPS 04/18/2013 1446   COCAINSCRNUR NONE DETECTED 07/11/2013 0305   COCAINSCRNUR NEG 04/18/2013 1446   LABBENZ POSITIVE* 07/11/2013 0305  LABBENZ PPS 04/18/2013 1446   AMPHETMU NONE DETECTED 07/11/2013 0305   THCU NONE DETECTED 07/11/2013 0305   THCU 27 12/02/2012 1121   LABBARB NONE DETECTED 07/11/2013 0305   LABBARB NEG 04/18/2013 1446    Alcohol Level:  Recent Labs Lab 07/06/13 2210 07/08/13 0930  ETH 242* <11   Urinalysis:  Recent Labs Lab 07/08/13 1210 07/11/13 0305  COLORURINE AMBER* YELLOW  LABSPEC 1.023 1.022  PHURINE 7.5 6.5  GLUCOSEU NEGATIVE NEGATIVE  HGBUR NEGATIVE NEGATIVE  BILIRUBINUR SMALL* NEGATIVE  KETONESUR 15* NEGATIVE  PROTEINUR 100* NEGATIVE  UROBILINOGEN 1.0 1.0  NITRITE NEGATIVE NEGATIVE  LEUKOCYTESUR TRACE* NEGATIVE    Micro Results: No results found for this or any previous visit (from the past 240 hour(s)). Studies/Results: No results found. Medications:  Scheduled  Meds: . aspirin EC  81 mg Oral Daily  . enoxaparin (LOVENOX) injection  40 mg Subcutaneous Q24H  . folic acid  1 mg Intravenous Daily  . OxyCODONE  10 mg Oral Q12H  . sodium chloride  3 mL Intravenous Q12H  . thiamine  100 mg Intravenous Daily   Continuous Infusions: . sodium chloride 100 mL/hr at 07/11/13 0859   PRN Meds:.acetaminophen, acetaminophen, ondansetron, oxyCODONE, promethazine Assessment/Plan: Principal Problem:   Gastroenteritis Active Problems:   Hepatitis C   Alcohol dependence   Chest pain   Hypotension, unspecified   Decreased oral intake   Homelessness   Dizziness   Abdominal pain, unspecified site   Sinus bradycardia  [name], [name] is a 57 year old caucasian homeless male with a PMH of polysubstance abuse, h/o alcohol detox (x 2-3 weeks ago), chronic pancreatitis, GERD, hepatitis C, hyperthyroidism and chronic pain (left shoulder, right knee) admitted from the internal medicine clinic due to gastroenteritis, generalized abdominal pain, chest pain and numbness and tingling in his b/l lower feet and left hand.   # Abdominal pain : Generalized, associated with diarrhea, n/v , poor appetite most likely gastroenteritis due to infectious colitis either viral or bacterial . Other consideration is acute pancreatitis, Lipase elevated to 72 from 38 (07/08/2013) in the setting of chronic acholic patient with h/o chronic pancreatitis. Less possibility though. Stool culture and GI pathogen panel by PCR, stool pending. He is c/o nausea and bring up saliva but no vomiting. Morning CMP unremarkable. K return to normal level after giving iv KCl 10 mEq .   -Continue  IVF  - Dilaudid to home medicine oxycodone - Zofran for nausea  - Diet : clear liquid - Thiamine (B-1) injection 100 mg IV daily   # Chest pain: Most Likely musculoskeletal. EKG showed sinus bradycardia and no ST chances. Troponin x3 negative. ACS less likely. We will continue to monitor. H/o GERD and Nissen  fundoplication at 40. But no burning pain but c/o dry heaving. Pneumonia was a another consideration n the setting of homeless man with occasional cough and chills. Pericarditis/ PE less likely.  - Chest x-ray pending  - GI cocktail if needed   # Chronic pain: chronic shoulder and and leg pain. Knee arthroscopy w/ debridement in 1980's due to accident. clavicular fracture 07/09/2012. Or possibly arthritic pain. Complains of 2-3 weeks of numbness and tingling in his b/l lower feet and left hand. Neuro exam is WNL.  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 Last B12 652 and Folate 16.2 wnl 05/2013.   - Oxycodone  # Alcoholic dependence: Homeless man and denies any recent alcohol use in last 7 days. No sign of  withdrawal- no trembling, tachycardia or agitation. We will involve with social worker for proper disposition and alcohol dependency.   - Iv thiamine and B12.  - Blood alcohol level- <11 (07/08/2013) - UDS- pt has hx of cocaine use  - folic acid 1 mg, IV, Daily    -  DVT prophylaxis: Lovonox   Dispo: Disposition is deferred at this time, awaiting improvement of current medical problems. Hopefully in later afternoon if he is feels better. We scheduled for follow up appointment with Dr. Sherrine Maples on Oct  07/18/2013 at 2:45 p.m.     LOS: 1 day   This is a Psychologist, occupational Note.  The care of the patient was discussed with Dr.Emokpae and the assessment and plan formulated with their assistance.  Please see their attached note for official documentation of the daily encounter.  [name] 07/11/2013

## 2013-07-11 NOTE — Discharge Summary (Signed)
Name: CID AGENA MRN: [id number] DOB: 1956-02-24 57 y.o. PCP: Annett Gula, MD  Date of Admission: 07/10/2013  5:45 PM Date of Discharge: 07/13/2013 Attending Physician: Dr. Jonah Blue Discharge Diagnosis: Principal Problem:   Pancreatitis, acute Active Problems:   Homelessness   Sinus bradycardia   Hepatitis C   Alcohol dependence   Chest pain   Decreased oral intake   Dizziness   BPH (benign prostatic hyperplasia)  Discharge Medications:   Medication List    STOP taking these medications       diphenoxylate-atropine 2.5-0.025 MG per tablet  Commonly known as:  LOMOTIL      TAKE these medications       aspirin 81 MG EC tablet  Take 1 tablet (81 mg total) by mouth daily.     dicyclomine 20 MG tablet  Commonly known as:  BENTYL  Take 20 mg by mouth 2 (two) times daily.     famotidine 20 MG tablet  Commonly known as:  PEPCID  Take 1 tablet (20 mg total) by mouth 2 (two) times daily.     feeding supplement (RESOURCE BREEZE) Liqd  Take 1 Container by mouth 3 (three) times daily between meals.     ondansetron 4 MG tablet  Commonly known as:  ZOFRAN  Take 1 tablet (4 mg total) by mouth every 12 (twelve) hours as needed for nausea.     oxyCODONE 5 MG immediate release tablet  Commonly known as:  Oxy IR/ROXICODONE  Take 5 mg by mouth every 4 (four) hours as needed for pain.     OxyCODONE 10 mg T12a 12 hr tablet  Commonly known as:  OXYCONTIN  Take 10 mg by mouth every 12 (twelve) hours.       Disposition and follow-up:   Mr.Edmund L Deleon was discharged from [facility] in stable condition.  At the hospital follow up visit please address:  1.   Pancreatitis: resolution of nausea and abdominal pain, tolerating regular diet well at time of discharge. On Chronic opiates but says they do not work well, consider adjusting regimen, which may alleviate nausea as well.  Sinus Bradycardia--stable during admission, follow  up Homeless--obtained shelter? Chest pain--resolved during admission, ?MSK. Consider stress test? Dizziness--resolved, but reports chronic tinnitus.  Neuropathy--b/l feet and left hand, ?2/2 chronic alcohol abuse. Consider neuropathic medication if appropriate? BPH--unable to afford medications previously recommended.  Consider more affordable options if possible.   2.  Pending labs/ test needing follow-up: Stool culture  Follow-up Appointments:     Follow-up Information   Follow up with Genelle Gather, MD. Schedule an appointment as soon as possible for a visit on 07/18/2013. (2:45 P.M.)    Specialty:  Internal Medicine   Contact information:   [address] [place] [address] [phone]      Discharge Instructions: Discharge Orders   Future Appointments Provider Department Dept Phone   07/18/2013 2:45 PM Genelle Gather, MD [facility] [phone]   Future Orders Complete By Expires   Call MD for:  difficulty breathing, headache or visual disturbances  As directed    Call MD for:  persistant nausea and vomiting  As directed    Call MD for:  severe uncontrolled pain  As directed    Call MD for:  temperature >100.4  As directed    Diet - low sodium heart healthy  As directed    Increase activity slowly  As directed  Procedures Performed:  Dg Chest 2 View  07/11/2013   CLINICAL DATA:  Chest pain. Smoker.  EXAM: CHEST  2 VIEW  COMPARISON:  05/25/2013  FINDINGS: Heart, mediastinum hila are within normal limits.  The lungs are mildly hyperexpanded. There is hazy focal opacity that projects in the right mid to lower lung on the frontal view that is not evident as a discrete opacity on the lateral view. This is likely superimposed structures. There is mild reticular opacity on the lateral view that projects within the right middle lobe, likely scarring. There is no infiltrate. There is no pulmonary edema.  An old left clavicle fracture has been  reduced with a fixation plate and screws. There are associated old healed posterior left rib fractures.  IMPRESSION: No acute cardiopulmonary disease.   Electronically Signed   By: Amie Portland M.D.   On: 07/11/2013 15:49   Ct Abdomen Pelvis W Contrast  07/13/2013   CLINICAL DATA:  Refractory abdominal pain and nausea. History of pancreatitis. Chronic alcohol abuse.  EXAM: CT ABDOMEN AND PELVIS WITH CONTRAST  TECHNIQUE: Multidetector CT imaging of the abdomen and pelvis was performed using the standard protocol following bolus administration of intravenous contrast.  CONTRAST:  OMNIPAQUE IOHEXOL 300 MG/ML  SOLN  COMPARISON:  Abdomen and pelvis radiographs obtained yesterday. CT dated 03/11/2013.  FINDINGS: Cholecystectomy clips and additional upper abdominal surgical clips. Multiple tiny bilateral renal cysts and small right renal cysts. Multiple tiny penile calcifications. Stable small, lobulated remaining portion of the spleen, status post partial splenectomy by history.  Normal appearing liver, pancreas, adrenal glands and urinary bladder. Normal-sized prostate gland with minimal calcification. No gastrointestinal abnormalities or enlarged lymph nodes. Normal appearing appendix. Minimal dependent atelectasis at the left lung base. Multiple old, healed right rib fractures. Lumbar spine degenerative changes.  IMPRESSION: No acute abnormality.   Electronically Signed   By: Gordan Payment M.D.   On: 07/13/2013 01:58   Dg Abd 2 Views  07/12/2013   CLINICAL DATA:  Left-sided abdominal pain with persistent nausea.  EXAM: ABDOMEN - 2 VIEW  COMPARISON:  05/25/2013.  FINDINGS: Normal bowel gas pattern. Mild increased stool in the colon. No free air.  Dense splenic artery calcifications are noted in the left upper quadrant. There are vascular clips in the central upper abdomen and in the right upper quadrant, also stable.  The soft tissues are otherwise unremarkable. The lung bases are clear. There are  degenerative changes at L4-L5.  IMPRESSION: 1. No acute findings. 2. Mild increased stool in the colon.   Electronically Signed   By: Amie Portland M.D.   On: 07/12/2013 11:47   Admission HPI: Clayden Withem is a 69 y o homeless white male with PMH of polysubstance abuse/alcohol dependence, chronic pancreatitis >10 years, HCV, hyperthyroidism, dyslipidemia, BPH, and chronic shoulder pain who presented to [facility] today with complaints of nausea and vomiting x1 day. He was noted to be hypotensive and bradycardic during that visit and also endorses dizziness. He claims he woke up around 1am this morning not feeling well with nausea and dry heaving. He denies vomiting any food but says a lot of saliva comes up. He also endorses abdominal pain, mainly epigastric and chest pain that is intermittent. The chest pain is intermittent, mainly left sided but tender to palpation on both sides, improves when leaning forwards, and completely resolves at time with hx of GERD and hx of Nissen's. Additionally, he has had diarrhea x 3 days--watery, non bloody, and several episodes  today. He endorses poor po intake over the past week, with dizziness and chronic tinnitus. His last alcohol in take was 7 days ago, says before that he had been drinking everyday for 2 months and has tried rehab in the past. He says he does not plan to drink any more.  Finally, Mr. Cuccia also reports new onset numbness and tingling in his b/l feet and also left hand that he says he has been noticing for 2-3 weeks. He also has chronic left shoulder pain with hx of clavicular fracture and now has right knee pain as well. He denies any recent trauma. His father died of heart attack when he was 35. Mr. Kroon denies hx of DM but smokes half a pack a day for the past 41 years.  Of note, he is on a pain contract with his PCP for oxycodone and oxycontin for his chronic shoulder pain but he says that Dilaudid works better and morphine gives him nausea.   Hospital  Course by problem list: Principal Problem:   Pancreatitis, acute Active Problems:   Homelessness   Sinus bradycardia   Hepatitis C   Alcohol dependence   Chest pain   Decreased oral intake   Dizziness   BPH (benign prostatic hyperplasia)  # Acute on chronic pancreatits--Improved during hospital course.  Initially presented with decreased po intake, nausea, diarrhea, and abdominal and chest pain worsening day of admission.  Initially kept NPO and PRN pain control with IV Morphine then changed to Dilaudid and eventually transitioned back to oral home medications with oxycodone.  Diet was advanced but nausea continued, and finally resolved day of admission with good appetite and tolerating regular diet.  Nausea control with PRN Zofran Phenergan, and did also receiving Reglan x1. Lipase elevated at 72 (above baseline ~40s) on admission. Hx of prior similar episodes.  Also has hx of Hepatitis C.  Abdominal xray (mild increased stool in colon) and abdominal CT unremarkable with no acute abnormality. HIV nonreactive. HbA1C 5.6 05/26/13. Mr. Dileonardo has been advised and counseled to avoid alcohol and he will need to follow up with PCP office upon discharge in one week.   # Chest pain--Resolved. Initially presented with reproducible and tender to palpation b/l upper chest pain.  Ruled out for ACS with negative cardiac enzymes and no ischemic changes on EKG.  Chest pain likely musculoskeletal in origin given hx of prior trauma and clavicular fracture as well.  GI etiology can also be possible and thus, he was started on H2 blocker to be continued on discharge as well.   Follow up with pcp advised. If continued, may benefit from outpatient stress test given risk factors including male sex, age, and smoking.   # Alcohol Dependence--counseled during admission and strongly advised for cessation.  Did not seem to be in withdrawal during hosputal course.  He claimed his last alcohol use was approximately one week  prior to admission and he does not plan to restart.  Also endorsed b/l foot numbness and tingling and left hand tingling at times.  Possibly secondary to neuropathy secondary to alcohol abuse.  Blood alcohol level <11 this admission and 07/11/13: B12 634 and Folate >20.  Treated with thiamine and folic acid supplementation during admission along with IVF hydration. Social work was consulted who provided assistance resources and program information.  Follow up with pcp advised.   # BPH--unable to afford his medications as an outpatient (Cardura and Proscar). Reports hesitancy and incomplete voiding. Did not start above medications  during admission due to initial hypotension.  Did not report difficulty with urination during hospitalization.  Initial U/A: Negative for nitrite, Hgb, and leukocytosis. Follow up with pcp and consider starting medications that he can afford.   # [place] Has daughter and son but says they are not local. He has been in a shelter in the past. Social work consulted who provided him resources of shelters and information to make calls to find availability which changes daily. He also reports that there is someone working on possible placement for him as an outpatient as well.  Hopefully he will be able to find a more stable home in the near future.   # Bradycardia--Stable during hospital course.Sinus bradycardia per EKG with HR 57. Initially improved with IVF. Likely in setting of decreased po intake and dehydration. Follow up with pcp advised.   # Dizziness--Resolved. Chronic, denied syncope or falls, hx of tinnitus. Likely exacerbated in setting of decreased po intake. Follow up with pcp.   Discharge Vitals:   BP 129/69  Pulse 58  Temp(Src) 98.5 F (36.9 C) (Oral)  Resp 16  Ht 5\' 10"  (1.778 m)  Wt 145 lb 14.4 oz (66.18 kg)  BMI 20.93 kg/m2  SpO2 99%  Discharge Labs:  Basic Metabolic Panel:  Recent Labs  45/40/98 2015 07/11/13 0625  NA  --   138  K  --  3.6  CL  --  105  CO2  --  23  GLUCOSE  --  106*  BUN  --  12  CREATININE 0.69 0.70  CALCIUM  --  8.5  MG 1.9  --    CBC:  Recent Labs  07/10/13 2015 07/12/13 1645  WBC 7.7 7.2  HGB 14.0 14.5  HCT 41.5 40.8  MCV 97.6 96.0  PLT 262 247   Cardiac Enzymes:  Recent Labs  07/11/13 0625 07/11/13 1145  TROPONINI <0.30 <0.30   Anemia Panel:  Recent Labs  07/11/13 0625  VITAMINB12 634  FOLATE >20.0   Urine Drug Screen: Drugs of Abuse     Component Value Date/Time   LABOPIA POSITIVE* 07/11/2013 0305   LABOPIA PPS 04/18/2013 1446   COCAINSCRNUR NONE DETECTED 07/11/2013 0305   COCAINSCRNUR NEG 04/18/2013 1446   LABBENZ POSITIVE* 07/11/2013 0305   LABBENZ PPS 04/18/2013 1446   AMPHETMU NONE DETECTED 07/11/2013 0305   THCU NONE DETECTED 07/11/2013 0305   THCU 27 12/02/2012 1121   LABBARB NONE DETECTED 07/11/2013 0305   LABBARB NEG 04/18/2013 1446    Urinalysis:  Recent Labs  07/11/13 0305  COLORURINE YELLOW  LABSPEC 1.022  PHURINE 6.5  GLUCOSEU NEGATIVE  HGBUR NEGATIVE  BILIRUBINUR NEGATIVE  KETONESUR NEGATIVE  PROTEINUR NEGATIVE  UROBILINOGEN 1.0  NITRITE NEGATIVE  LEUKOCYTESUR NEGATIVE   Signed: Darden Palmer, MD 07/14/2013, 8:18 AM   Time Spent on Discharge: 40 minutes Services Ordered on Discharge: none Equipment Ordered on Discharge: none

## 2013-07-11 NOTE — Progress Notes (Signed)
Subjective: Complaints of persistent nausea with some reflux symptoms.    Objective: Vital signs in last 24 hours: Filed Vitals:   07/10/13 1920 07/10/13 2033 07/11/13 0500 07/11/13 1414  BP:  118/69 122/73 178/90  Pulse:  61 58 61  Temp:  98.6 F (37 C) 98.8 F (37.1 C) 98.1 F (36.7 C)  TempSrc:  Oral Oral   Resp:  18 18 16   Height: 5\' 10"  (1.778 m)     Weight: 145 lb 14.4 oz (66.18 kg)     SpO2:  99% 97% 99%   Weight change:   Intake/Output Summary (Last 24 hours) at 07/11/13 1440 Last data filed at 07/11/13 1415  Gross per 24 hour  Intake 2136.67 ml  Output   1450 ml  Net 686.67 ml   Physical Exam-  GENERAL- alert, co-operative, appears as stated age, having some mild dry heaving.  HEENT- NCAT CARDIAC- RRR, no murmurs, rubs or gallops.  RESP- Moving equal volumes of air, and clear to auscultation bilaterally.  ABDOMEN- Soft mildly tender, no rebound or guarding, no palpable masses or organomegaly, bowel sounds present.  BACK- Normal curvature of the spine, No tenderness along the vertebrae, no CVA tenderness.  NEURO- No obvious Cr n abn, strenght equal and present in all extremities, DTRs- intact, sensation to light touch, intact, position sense normal.  EXTREMITIES- pulse 2+, symmetric, in all extremities  SKIN- Warm, dry, No rash or lesion.   Lab Results: Basic Metabolic Panel:  Recent Labs Lab 07/10/13 1653 07/10/13 2015 07/11/13 0625  NA 138  --  138  K 3.3*  --  3.6  CL 102  --  105  CO2 24  --  23  GLUCOSE 124*  --  106*  BUN 13  --  12  CREATININE 0.65 0.69 0.70  CALCIUM 8.7  --  8.5  MG  --  1.9  --    Liver Function Tests:  Recent Labs Lab 07/08/13 0930 07/10/13 1653  AST 89* 62*  ALT 98* 86*  ALKPHOS 94 75  BILITOT 0.7 0.5  PROT 8.2 7.2  ALBUMIN 3.7 3.4*    Recent Labs Lab 07/08/13 0930 07/10/13 1653  LIPASE 38 72*   CBC:  Recent Labs Lab 07/08/13 0930 07/10/13 2015  WBC 7.9 7.7  NEUTROABS 6.1  --   HGB 14.9 14.0    HCT 42.1 41.5  MCV 98.1 97.6  PLT 300 262   Cardiac Enzymes:  Recent Labs Lab 07/10/13 1653 07/11/13 0625 07/11/13 1145  TROPONINI <0.30 <0.30 <0.30   Anemia Panel:  Recent Labs Lab 07/11/13 0625  VITAMINB12 634  FOLATE >20.0   Urine Drug Screen: Drugs of Abuse     Component Value Date/Time   LABOPIA POSITIVE* 07/11/2013 0305   LABOPIA PPS 04/18/2013 1446   COCAINSCRNUR NONE DETECTED 07/11/2013 0305   COCAINSCRNUR NEG 04/18/2013 1446   LABBENZ POSITIVE* 07/11/2013 0305   LABBENZ PPS 04/18/2013 1446   AMPHETMU NONE DETECTED 07/11/2013 0305   THCU NONE DETECTED 07/11/2013 0305   THCU 27 12/02/2012 1121   LABBARB NONE DETECTED 07/11/2013 0305   LABBARB NEG 04/18/2013 1446    Alcohol Level:  Recent Labs Lab 07/06/13 2210 07/08/13 0930  ETH 242* <11   Urinalysis:  Recent Labs Lab 07/08/13 1210 07/11/13 0305  COLORURINE AMBER* YELLOW  LABSPEC 1.023 1.022  PHURINE 7.5 6.5  GLUCOSEU NEGATIVE NEGATIVE  HGBUR NEGATIVE NEGATIVE  BILIRUBINUR SMALL* NEGATIVE  KETONESUR 15* NEGATIVE  PROTEINUR 100* NEGATIVE  UROBILINOGEN 1.0  1.0  NITRITE NEGATIVE NEGATIVE  LEUKOCYTESUR TRACE* NEGATIVE   Micro Results: No results found for this or any previous visit (from the past 240 hour(s)). Studies/Results: No results found. Medications: I have reviewed the patient's current medications. Scheduled Meds: . aspirin EC  81 mg Oral Daily  . enoxaparin (LOVENOX) injection  40 mg Subcutaneous Q24H  . famotidine  20 mg Oral BID  . folic acid  1 mg Intravenous Daily  . OxyCODONE  10 mg Oral Q12H  . sodium chloride  3 mL Intravenous Q12H  . thiamine  100 mg Intravenous Daily   Continuous Infusions: . sodium chloride 100 mL/hr at 07/11/13 0859   PRN Meds:.acetaminophen, acetaminophen, ondansetron, oxyCODONE Assessment/Plan:  # Gastroenteritis--With vomiting and diarrhea. Diarrhoea mostly resolved. Fluid loss with decreased PO intake likley cause of low blood pressure and  dizziness. Likely Viral vs bacterial etiology, also could be secondary to acute on chronic pancreatitis. Lipase elevated at 72 (above baseline ~40s), however not reliable indicator in setting of chronic disease.  CT abdomen in 03/2013: No acute intra-abdominal or pelvic finding by noncontrast CT. - HIV- neg.  - UA- No concerning abnormalities.  -GI stool panel - awaiting results, but sample not collected as pt has not had bowel movement all day. - Patient eager to eat- advance as tolerated.  -IVF - d/c as pt is now tolerating oral intake. - Will start on his home pain regimen, Oxycodone- IR- 5mg  Q4H, and oxycodone- 10mg  Q12H. -Iv zofran 4mg  Q8H. -u/a  # Chest pain--Pt R/o for ACS with negative troponins X4, with no ischemic EKG changes. Reproducible on physical exam, likely MSK etiology He does have a hx of GERD,with previous surgery for fundoplication, also consider referred chronic pancreatitis pain. Will start H2 blocker.  - Commenced Famotidine- 20mg  BID -Awaiting Chest xray.   -Continue ASA. # Alcohol Dependence- Currently pt does not seem to be in withdrawal. Claims last drink was last week. 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 Last B12 652 and Folate 16.2 wnl 05/2013. B12 and folate on this admission- WNL. - Iv thiamine and B12  - Blood alcohol level 07/10/2013- <11.   - Consult to social work.  # BPH--unable to afford his medications as an outpatient (Cardura and Proscar). Reports hesitancy and incomplete voiding. Will hold on starting medications at this time in setting of hypotension.  -u/a  -monitor I&O   # Homelessness[place]. Has daughter and son but says they are not local. He has been in a shelter in the past.  -social work consult.   # Bradycardia--sinus bradycardia per EKG with HR 57. Improved with IVF. Likely in setting of decreased po intake and dehydration.  -AM EKG   # Dizziness--chronic,  denies syncope or falls, hx of tinnitus. Likely exacerbated in setting of decreased po intake. Bp now WNL. -Continue to monitor.   Diet: Advance as tolerated.  DVT Ppx: Lovenox  # Code- full.  Dispo: Disposition is deferred at this time, awaiting improvement of current medical problems.  Anticipated discharge in approximately 1 day.    The patient does have a current PCP Annett Gula, MD) and does need an [facility] hospital follow-up appointment after discharge.  The patient does not know have transportation limitations that hinder transportation to clinic appointments.  .Services Needed at time of discharge: Y = Yes, Blank = No PT:   OT:   RN:   Equipment:   Other:  LOS: 1 day   Kennis Carina, MD 07/11/2013, 2:40 PM

## 2013-07-11 NOTE — Progress Notes (Signed)
Clinical Social Work Department BRIEF PSYCHOSOCIAL ASSESSMENT 07/11/2013  Patient:  [name], [name]     Account Number:  [id number]     Admit date:  07/10/2013  Clinical Social Worker:  [name] Nakayama  Date/Time:  07/11/2013 02:30 PM  Referred by:  Physician  Date Referred:  07/11/2013 Referred for  Homelessness  Substance Abuse   Other Referral:   Interview type:  Patient Other interview type:    PSYCHOSOCIAL DATA Living Status:  OTHER Admitted from facility:   Level of care:   Primary support name:  Blima Dessert [phone] Primary support relationship to patient:  CHILD, ADULT Degree of support available:   Pt has limited support system.    CURRENT CONCERNS Current Concerns  Substance Abuse  Financial Resources   Other Concerns:    SOCIAL WORK ASSESSMENT / PLAN CSW received consults to speak with pt about substance abuse and homlessness. CSW spoke with pt who reported that he detoxed about 1 week ago and has not had a drink since then. Prior to that pt had started drinking again for about 2 months. Pt stated he drank 4 forties almost every day. Pt stated he was just going through a tough time and resumed drinking. CSW spoke with pt about negative effects of alcohol on pt health. Pt understanding and stated that he does not think he will have a problem continuing to avoid alcohol. CSW asked if pt would like to be provided with AA list or subtance abuse treatment resources. Pt stated that AA was not for him but he would consider an outpatient treatment approach. CSW provided pt with list of resources.    CSW discussed pt living situation. Pt reported no income and currently [place]". CSW asked if pt has tried staying at a shelter. Pt reported that he tried calling once but the shelther was full. CSW provided pt with shelter list and advised pt to try calling while here in hospital and also if pt continues to have a need for a shelter he should  always follow up multiple times with shelters as spots do become available. CSW also provided with list of shelters in surrounding areas some of which are targeted for substance abuse recovery clients. Pt reported that he is working on receiving SSI and has a contact that is assiting with this. CSW asked if pt would like a list of affordable housing for when he does have some income available. Pt was pleased to have this information. CSW also provided pt with list of homelessness assistance programs, food pantries/hot meals, and clothing assistance programs. Pt was extremely greatful and reported he would be following up with all resources.  CSW identifies no further needs please reconsult if new needs arise. CSW signing off.   Assessment/plan status:  No Further Intervention Required Other assessment/ plan:   Information/referral to community resources:   Print production planner (shelters, food, [employer] fast streamer, etc...), [employer], Substance abuse treatment resources    PATIENT'S/FAMILY'S RESPONSE TO PLAN OF CARE: Pt was very Adult nurse of CSW assitance and agreeable to contacting resources.       Gayna Braddy, LCSWA [phone]

## 2013-07-11 NOTE — H&P (Signed)
INTERNAL MEDICINE TEACHING SERVICE Attending Admission Note  Date: 07/11/2013  Patient name: ESCHOL AUXIER  Medical record number: [id number]  Date of birth: [birth year]    I have seen and evaluated Jarome Matin and discussed their care with the Residency Team.  Clinical presentation is consistent with gastroenteritis, but given his history of chronic pancreatitis, there may be an acute component. He has +BS and abdomen is not distended or tympanic.  Supportive treatment for now. Advance diet as tolerated.  Alcohol cessation needed.  Replace potassium.  His chest pain is musculoskeletal, doubt ACS.  If he feels better this afternoon he can be discharged later today.  Jonah Blue, DO, FACP [facility] Residency Program 07/11/2013, 12:04 PM
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
## 2013-07-11 NOTE — Progress Notes (Signed)
Case discussed with Dr. McLean at the time of the visit.  We reviewed the resident's history and exam and pertinent patient test results.  I agree with the assessment, diagnosis, and plan of care documented in the resident's note.     

## 2013-07-12 ENCOUNTER — Observation Stay (HOSPITAL_COMMUNITY): Payer: No Typology Code available for payment source

## 2013-07-12 DIAGNOSIS — K859 Acute pancreatitis without necrosis or infection, unspecified: Principal | ICD-10-CM

## 2013-07-12 LAB — CBC
HCT: 40.8 % (ref 39.0–52.0)
Hemoglobin: 14.5 g/dL (ref 13.0–17.0)
MCH: 34.1 pg — ABNORMAL HIGH (ref 26.0–34.0)
MCHC: 35.5 g/dL (ref 30.0–36.0)
MCV: 96 fL (ref 78.0–100.0)
RBC: 4.25 MIL/uL (ref 4.22–5.81)

## 2013-07-12 MED ORDER — PROMETHAZINE HCL 25 MG/ML IJ SOLN: 12.5000 mg | Freq: Once | INTRAMUSCULAR | Status: DC

## 2013-07-12 MED ORDER — FOLIC ACID 1 MG PO TABS
1.0000 mg | ORAL_TABLET | Freq: Every day | ORAL | Status: DC
  Administered 2013-07-12 – 2013-07-13 (×2): 1 mg via ORAL
  Filled 2013-07-12 (×2): qty 1

## 2013-07-12 MED ORDER — VITAMIN B-1 100 MG PO TABS
100.0000 mg | ORAL_TABLET | Freq: Every day | ORAL | Status: DC
  Administered 2013-07-12 – 2013-07-13 (×2): 100 mg via ORAL
  Filled 2013-07-12 (×2): qty 1

## 2013-07-12 MED ORDER — ONDANSETRON HCL 4 MG/2ML IJ SOLN
4.0000 mg | Freq: Three times a day (TID) | INTRAMUSCULAR | Status: DC | PRN
  Administered 2013-07-12 (×2): 4 mg via INTRAVENOUS
  Filled 2013-07-12: qty 2

## 2013-07-12 MED ORDER — SODIUM CHLORIDE 0.9 % IV SOLN: INTRAVENOUS | Status: DC

## 2013-07-12 MED ORDER — PROMETHAZINE HCL 25 MG/ML IJ SOLN
12.5000 mg | Freq: Once | INTRAMUSCULAR | Status: AC
  Administered 2013-07-12: 12.5 mg via INTRAMUSCULAR
  Filled 2013-07-12: qty 1

## 2013-07-12 MED ORDER — METOCLOPRAMIDE HCL 5 MG/ML IJ SOLN
5.0000 mg | Freq: Four times a day (QID) | INTRAMUSCULAR | Status: DC | PRN
  Administered 2013-07-12: 5 mg via INTRAVENOUS
  Filled 2013-07-12 (×3): qty 1

## 2013-07-12 NOTE — Progress Notes (Signed)
Subjective: [name] was seen and examined at bedside.  He continues to complain of nausea. Yesterday, he had a good appetite and requested a regular tray but continues to have nausea. He has not vomited but continues to dry heave with saliva and has not had any more BM since admission.    Objective: Vital signs in last 24 hours: Filed Vitals:   07/11/13 0500 07/11/13 1414 07/11/13 2200 07/12/13 0649  BP: 122/73 178/90 113/69 163/88  Pulse: 58 61 55 61  Temp: 98.8 F (37.1 C) 98.1 F (36.7 C) 98 F (36.7 C) 98 F (36.7 C)  TempSrc: Oral  Oral Oral  Resp: 18 16 18    Height:      Weight:      SpO2: 97% 99% 98% 98%   Weight change:   Intake/Output Summary (Last 24 hours) at 07/12/13 0915 Last data filed at 07/11/13 2100  Gross per 24 hour  Intake    240 ml  Output   1000 ml  Net   -760 ml   Physical Exam-  GENERAL- awake, alert, NAD HEENT- NCAT CARDIAC- RRR, no murmurs, rubs or gallops.  RESP-  clear to auscultation bilaterally.  ABDOMEN- Soft, ND, tender to palpation in epigastric region, bowel sounds present.  NEURO- AAOX3  EXTREMITIES-moving all extremities, -edema SKIN- Warm, dry  Lab Results: Basic Metabolic Panel:  Recent Labs Lab 07/10/13 1653 07/10/13 2015 07/11/13 0625  NA 138  --  138  K 3.3*  --  3.6  CL 102  --  105  CO2 24  --  23  GLUCOSE 124*  --  106*  BUN 13  --  12  CREATININE 0.65 0.69 0.70  CALCIUM 8.7  --  8.5  MG  --  1.9  --    Liver Function Tests:  Recent Labs Lab 07/08/13 0930 07/10/13 1653  AST 89* 62*  ALT 98* 86*  ALKPHOS 94 75  BILITOT 0.7 0.5  PROT 8.2 7.2  ALBUMIN 3.7 3.4*    Recent Labs Lab 07/08/13 0930 07/10/13 1653  LIPASE 38 72*   CBC:  Recent Labs Lab 07/08/13 0930 07/10/13 2015  WBC 7.9 7.7  NEUTROABS 6.1  --   HGB 14.9 14.0  HCT 42.1 41.5  MCV 98.1 97.6  PLT 300 262   Cardiac Enzymes:  Recent Labs Lab 07/10/13 1653 07/11/13 0625 07/11/13 1145  TROPONINI <0.30 <0.30 <0.30    Anemia Panel:  Recent Labs Lab 07/11/13 0625  VITAMINB12 634  FOLATE >20.0   Urine Drug Screen: Drugs of Abuse     Component Value Date/Time   LABOPIA POSITIVE* 07/11/2013 0305   LABOPIA PPS 04/18/2013 1446   COCAINSCRNUR NONE DETECTED 07/11/2013 0305   COCAINSCRNUR NEG 04/18/2013 1446   LABBENZ POSITIVE* 07/11/2013 0305   LABBENZ PPS 04/18/2013 1446   AMPHETMU NONE DETECTED 07/11/2013 0305   THCU NONE DETECTED 07/11/2013 0305   THCU 27 12/02/2012 1121   LABBARB NONE DETECTED 07/11/2013 0305   LABBARB NEG 04/18/2013 1446    Alcohol Level:  Recent Labs Lab 07/06/13 2210 07/08/13 0930  ETH 242* <11   Urinalysis:  Recent Labs Lab 07/08/13 1210 07/11/13 0305  COLORURINE AMBER* YELLOW  LABSPEC 1.023 1.022  PHURINE 7.5 6.5  GLUCOSEU NEGATIVE NEGATIVE  HGBUR NEGATIVE NEGATIVE  BILIRUBINUR SMALL* NEGATIVE  KETONESUR 15* NEGATIVE  PROTEINUR 100* NEGATIVE  UROBILINOGEN 1.0 1.0  NITRITE NEGATIVE NEGATIVE  LEUKOCYTESUR TRACE* NEGATIVE   Studies/Results: Dg Chest 2 View  07/11/2013   CLINICAL  DATA:  Chest pain. Smoker.  EXAM: CHEST  2 VIEW  COMPARISON:  05/25/2013  FINDINGS: Heart, mediastinum hila are within normal limits.  The lungs are mildly hyperexpanded. There is hazy focal opacity that projects in the right mid to lower lung on the frontal view that is not evident as a discrete opacity on the lateral view. This is likely superimposed structures. There is mild reticular opacity on the lateral view that projects within the right middle lobe, likely scarring. There is no infiltrate. There is no pulmonary edema.  An old left clavicle fracture has been reduced with a fixation plate and screws. There are associated old healed posterior left rib fractures.  IMPRESSION: No acute cardiopulmonary disease.   Electronically Signed   By: Amie Portland M.D.   On: 07/11/2013 15:49   Medications: I have reviewed the patient's current medications. Scheduled Meds: . aspirin EC  81 mg  Oral Daily  . enoxaparin (LOVENOX) injection  40 mg Subcutaneous Q24H  . famotidine  20 mg Oral BID  . feeding supplement (RESOURCE BREEZE)  1 Container Oral TID BM  . folic acid  1 mg Oral Daily  . OxyCODONE  10 mg Oral Q12H  . sodium chloride  3 mL Intravenous Q12H  . thiamine  100 mg Oral Daily   Continuous Infusions:   PRN Meds:.acetaminophen, acetaminophen, ondansetron, oxyCODONE Assessment/Plan:  # Acute on chronic pancreatits-improved but continues to have nausea and abdominal pain, No BM since admission.  Lipase elevated at 72 (above baseline ~40s), however not reliable indicator in setting of chronic disease.  CT abdomen in 03/2013: No acute intra-abdominal or pelvic finding by noncontrast CT. Nausea could be secondary to viral gastroenteritis as well given diarrhea prior to admission and dry heaving.  HIV nonreactive. HbA1C 5.6 05/26/13 -GI stool panel - awaiting results, but sample not collected as pt has not had bowel movement yet -NPO -Continue home pain regimen, Oxycodone- IR- 5mg  Q4H, and oxycodone- 10mg  Q12H. -Zofran prn -abdominal xray  # Chest pain--Resolved.  Pt R/o for ACS with negative troponins, with no ischemic EKG changes. Reproducible on physical exam, likely MSK etiology He does have a hx of GERD,with previous surgery for fundoplication, also consider referred chronic pancreatitis pain. CXR 10/10: no acute cardiopulmonary disease.  -Continue Famotidine- 20mg  BID  -Continue ASA.  # Alcohol Dependence--Currently pt does not seem to be in withdrawal. Claims last drink was last week. 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 Last B12 634 and Folate >20 - thiamine and folic acid - Blood alcohol level 07/10/2013- <11.   - Consult to social work--cessation assitance  # BPH--unable to afford his medications as an outpatient (Cardura and Proscar). Reports hesitancy and incomplete voiding. Will hold on starting  medications at this time in setting of initial hypotension. U/A:  Negative for nitrite, Hgb, and leukocytosis. -monitor I&O   # Homelessness[place]. Has daughter and son but says they are not local. He has been in a shelter in the past.  -social work consulted--provided resources of shelters to patient. Asked them to call to see if any availability today for possible dispo options upon discharge.   # Bradycardia--Resolved. sinus bradycardia per EKG with HR 57. Improved with IVF. Likely in setting of decreased po intake and dehydration.   # Dizziness--Improved.  Chronic, denies syncope or falls, hx of tinnitus. Likely exacerbated in setting of decreased po intake. BP now WNL. -Continue to monitor.  Diet: NPO  DVT Ppx: Lovenox  # Code- full.  Dispo: Disposition is deferred at this time, awaiting improvement of current medical problems.  Anticipated discharge in approximately 1 day.   The patient does have a current PCP Annett Gula, MD) and does need an [facility] hospital follow-up appointment after discharge.  The patient does not know have transportation limitations that hinder transportation to clinic appointments.  .Services Needed at time of discharge: Y = Yes, Blank = No PT:   OT:   RN:   Equipment:   Other:     LOS: 2 days   Darden Palmer, MD 07/12/2013, 9:15 AM

## 2013-07-13 ENCOUNTER — Inpatient Hospital Stay (HOSPITAL_COMMUNITY): Payer: No Typology Code available for payment source

## 2013-07-13 ENCOUNTER — Encounter (HOSPITAL_COMMUNITY): Payer: Self-pay | Admitting: Radiology

## 2013-07-13 DIAGNOSIS — N400 Enlarged prostate without lower urinary tract symptoms: Secondary | ICD-10-CM

## 2013-07-13 MED ORDER — ASPIRIN 81 MG PO TBEC: 81.0000 mg | DELAYED_RELEASE_TABLET | Freq: Every day | ORAL | Status: DC

## 2013-07-13 MED ORDER — FAMOTIDINE 20 MG PO TABS: 20.0000 mg | ORAL_TABLET | Freq: Two times a day (BID) | ORAL | Status: DC

## 2013-07-13 MED ORDER — ONDANSETRON HCL 4 MG PO TABS: 4.0000 mg | ORAL_TABLET | Freq: Two times a day (BID) | ORAL | Status: DC | PRN

## 2013-07-13 MED ORDER — BOOST / RESOURCE BREEZE PO LIQD: 1.0000 | Freq: Three times a day (TID) | ORAL | Status: DC

## 2013-07-13 MED ORDER — IOHEXOL 300 MG/ML  SOLN
100.0000 mL | Freq: Once | INTRAMUSCULAR | Status: AC | PRN
  Administered 2013-07-13: 100 mL via INTRAVENOUS

## 2013-07-13 NOTE — H&P (Signed)
  I have seen and examined the patient myself, and I have reviewed the note by Junious Silk, Extern, and was present during the interview and physical exam.  Please see my separate H&P for additional findings, assessment, and plan.   Signed: Kennis Carina, MD 07/13/2013, 7:06 PM

## 2013-07-13 NOTE — Progress Notes (Addendum)
Subjective: [name] was seen and examined at bedside.  He reports feeling much better this morning and is ready to eat and go home.  He reports no more nausea but continues to have mild abdominal pain which he says is chronic.  He remained NPO yesterday.  He denies any chest pain, N/V/D, fever, chills, or shortness of breath at this time.    Objective: Vital signs in last 24 hours: Filed Vitals:   07/12/13 0649 07/12/13 1415 07/12/13 2100 07/13/13 0516  BP: 163/88 135/72 111/64 129/69  Pulse: 61 65 55 58  Temp: 98 F (36.7 C) 98.2 F (36.8 C) 99 F (37.2 C) 98.5 F (36.9 C)  TempSrc: Oral Oral Oral Oral  Resp:  18 16 16   Height:      Weight:      SpO2: 98% 97% 98% 99%   Weight change:   Intake/Output Summary (Last 24 hours) at 07/13/13 4098 Last data filed at 07/13/13 0700  Gross per 24 hour  Intake 1484.25 ml  Output    300 ml  Net 1184.25 ml   Physical Exam-  GENERAL--awake, alert, NAD HEENT- NCAT CARDIAC- RRR, no murmurs, rubs or gallops.  RESP-  clear to auscultation bilaterally.  ABDOMEN- Soft, ND, mild epigastric tenderness to palpation, bowel sounds present.  NEURO- AAOX3, strength equal and symmetric in b/l extremities EXTREMITIES-moving all extremities, -edema SKIN- Warm, dry  Lab Results: Basic Metabolic Panel:  Recent Labs Lab 07/10/13 1653 07/10/13 2015 07/11/13 0625  NA 138  --  138  K 3.3*  --  3.6  CL 102  --  105  CO2 24  --  23  GLUCOSE 124*  --  106*  BUN 13  --  12  CREATININE 0.65 0.69 0.70  CALCIUM 8.7  --  8.5  MG  --  1.9  --    Liver Function Tests:  Recent Labs Lab 07/08/13 0930 07/10/13 1653  AST 89* 62*  ALT 98* 86*  ALKPHOS 94 75  BILITOT 0.7 0.5  PROT 8.2 7.2  ALBUMIN 3.7 3.4*    Recent Labs Lab 07/08/13 0930 07/10/13 1653  LIPASE 38 72*   CBC:  Recent Labs Lab 07/08/13 0930 07/10/13 2015 07/12/13 1645  WBC 7.9 7.7 7.2  NEUTROABS 6.1  --   --   HGB 14.9 14.0 14.5  HCT 42.1 41.5 40.8  MCV 98.1  97.6 96.0  PLT 300 262 247   Cardiac Enzymes:  Recent Labs Lab 07/10/13 1653 07/11/13 0625 07/11/13 1145  TROPONINI <0.30 <0.30 <0.30   Anemia Panel:  Recent Labs Lab 07/11/13 0625  VITAMINB12 634  FOLATE >20.0   Urine Drug Screen: Drugs of Abuse     Component Value Date/Time   LABOPIA POSITIVE* 07/11/2013 0305   LABOPIA PPS 04/18/2013 1446   COCAINSCRNUR NONE DETECTED 07/11/2013 0305   COCAINSCRNUR NEG 04/18/2013 1446   LABBENZ POSITIVE* 07/11/2013 0305   LABBENZ PPS 04/18/2013 1446   AMPHETMU NONE DETECTED 07/11/2013 0305   THCU NONE DETECTED 07/11/2013 0305   THCU 27 12/02/2012 1121   LABBARB NONE DETECTED 07/11/2013 0305   LABBARB NEG 04/18/2013 1446    Alcohol Level:  Recent Labs Lab 07/06/13 2210 07/08/13 0930  ETH 242* <11   Urinalysis:  Recent Labs Lab 07/08/13 1210 07/11/13 0305  COLORURINE AMBER* YELLOW  LABSPEC 1.023 1.022  PHURINE 7.5 6.5  GLUCOSEU NEGATIVE NEGATIVE  HGBUR NEGATIVE NEGATIVE  BILIRUBINUR SMALL* NEGATIVE  KETONESUR 15* NEGATIVE  PROTEINUR 100* NEGATIVE  UROBILINOGEN  1.0 1.0  NITRITE NEGATIVE NEGATIVE  LEUKOCYTESUR TRACE* NEGATIVE   Studies/Results: Dg Chest 2 View  07/11/2013   CLINICAL DATA:  Chest pain. Smoker.  EXAM: CHEST  2 VIEW  COMPARISON:  05/25/2013  FINDINGS: Heart, mediastinum hila are within normal limits.  The lungs are mildly hyperexpanded. There is hazy focal opacity that projects in the right mid to lower lung on the frontal view that is not evident as a discrete opacity on the lateral view. This is likely superimposed structures. There is mild reticular opacity on the lateral view that projects within the right middle lobe, likely scarring. There is no infiltrate. There is no pulmonary edema.  An old left clavicle fracture has been reduced with a fixation plate and screws. There are associated old healed posterior left rib fractures.  IMPRESSION: No acute cardiopulmonary disease.   Electronically Signed   By: Amie Portland M.D.   On: 07/11/2013 15:49   Ct Abdomen Pelvis W Contrast  07/13/2013   CLINICAL DATA:  Refractory abdominal pain and nausea. History of pancreatitis. Chronic alcohol abuse.  EXAM: CT ABDOMEN AND PELVIS WITH CONTRAST  TECHNIQUE: Multidetector CT imaging of the abdomen and pelvis was performed using the standard protocol following bolus administration of intravenous contrast.  CONTRAST:  OMNIPAQUE IOHEXOL 300 MG/ML  SOLN  COMPARISON:  Abdomen and pelvis radiographs obtained yesterday. CT dated 03/11/2013.  FINDINGS: Cholecystectomy clips and additional upper abdominal surgical clips. Multiple tiny bilateral renal cysts and small right renal cysts. Multiple tiny penile calcifications. Stable small, lobulated remaining portion of the spleen, status post partial splenectomy by history.  Normal appearing liver, pancreas, adrenal glands and urinary bladder. Normal-sized prostate gland with minimal calcification. No gastrointestinal abnormalities or enlarged lymph nodes. Normal appearing appendix. Minimal dependent atelectasis at the left lung base. Multiple old, healed right rib fractures. Lumbar spine degenerative changes.  IMPRESSION: No acute abnormality.   Electronically Signed   By: Gordan Payment M.D.   On: 07/13/2013 01:58   Dg Abd 2 Views  07/12/2013   CLINICAL DATA:  Left-sided abdominal pain with persistent nausea.  EXAM: ABDOMEN - 2 VIEW  COMPARISON:  05/25/2013.  FINDINGS: Normal bowel gas pattern. Mild increased stool in the colon. No free air.  Dense splenic artery calcifications are noted in the left upper quadrant. There are vascular clips in the central upper abdomen and in the right upper quadrant, also stable.  The soft tissues are otherwise unremarkable. The lung bases are clear. There are degenerative changes at L4-L5.  IMPRESSION: 1. No acute findings. 2. Mild increased stool in the colon.   Electronically Signed   By: Amie Portland M.D.   On: 07/12/2013 11:47   Medications: I  have reviewed the patient's current medications. Scheduled Meds: . aspirin EC  81 mg Oral Daily  . enoxaparin (LOVENOX) injection  40 mg Subcutaneous Q24H  . famotidine  20 mg Oral BID  . feeding supplement (RESOURCE BREEZE)  1 Container Oral TID BM  . folic acid  1 mg Oral Daily  . OxyCODONE  10 mg Oral Q12H  . sodium chloride  3 mL Intravenous Q12H  . thiamine  100 mg Oral Daily   Continuous Infusions: . sodium chloride 75 mL/hr (07/12/13 1115)   PRN Meds:.acetaminophen, acetaminophen, metoCLOPramide (REGLAN) injection, ondansetron, oxyCODONE Assessment/Plan:  # Acute on chronic pancreatits--improved, nausea resolved, chronic epigastric pain. Lipase elevated at 72 (above baseline ~40s) on admission, however not reliable indicator in setting of chronic disease. HIV nonreactive. HbA1C  5.6 05/26/13.  Abdominal xray (mild increased stool in colon) and abdominal CT unremarkable with no acute abnormality.   -will advance diet as tolerated today, start with liquids -Continue home pain regimen, Oxycodone- IR- 5mg  Q4H, and oxycodone- 10mg  Q12H. -Zofran prn -reglan PRN  # Chest pain--Resolved.  Pt R/o for ACS with negative troponins, with no ischemic EKG changes. Reproducible on physical exam, likely MSK etiology He does have a hx of GERD,with previous surgery for fundoplication, also consider referred chronic pancreatitis pain. CXR 10/10: no acute cardiopulmonary disease.  -Continue Famotidine- 20mg  BID  -Continue ASA.  # Alcohol Dependence--Currently pt does not seem to be in withdrawal. Claims last drink was last week. 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 Last B12 634 and Folate >20 - thiamine and folic acid - Blood alcohol level 07/10/2013- <11.  - Consulted to social work--cessation assitance  # BPH--unable to afford his medications as an outpatient (Cardura and Proscar). Reports hesitancy and incomplete voiding. Will hold on  starting medications at this time in setting of initial hypotension. U/A:  Negative for nitrite, Hgb, and leukocytosis. -monitor I&O   # Hom[place]ness[place] Has daughter and son but says they are not local. He has been in a shelter in the past.  -social work consulted--provided resources of shelters to patient. Asked them to call to see if any availability for possible dispo options upon discharge.   # Bradycardia--Resolved. sinus bradycardia per EKG with HR 57. Improved with IVF. Likely in setting of decreased po intake and dehydration.   # Dizziness--Improved.  Chronic, denies syncope or falls, hx of tinnitus. Likely exacerbated in setting of decreased po intake. BP now WNL. -Continue to monitor.    Diet: full liquids and advance as tolerated  DVT Ppx: Lovenox  # Code- full.  Dispo: possible d/c today if tolerating diet.  Patient says he has no local family and that he has been homeless for sometime. He says he has someone working on possible shelter arrangements with him outside as well.  He was seen by social work this admission who provided him with a list of resources including shelters and their phone numbers to call for availability.  Hopefully he will follow up their recommendations and make arrangements.   The patient does have a current PCP Annett Gula, MD) and does need an [facility] hospital follow-up appointment after discharge.  The patient does not know have transportation limitations that hinder transportation to clinic appointments.  .Services Needed at time of discharge: Y = Yes, Blank = No PT:   OT:   RN:   Equipment:   Other:     LOS: 3 days   Darden Palmer, MD 07/13/2013, 7:12 AM

## 2013-07-13 NOTE — Clinical Social Work Note (Signed)
CSW received call from unit RN stating that patient needs help with housing. Notes state that the patient was given shelter list and other resources. CSW informed RN that patient needs to follow up with shelter to determine which ones have available beds. CSW signing off at this time.   Roddie Mc, [place], [place], [phone]

## 2013-07-13 NOTE — Clinical Social Work Note (Signed)
Bus pass given to patient to help with transportation needs. CSW signing off.  Roddie Mc, [place], [place], [phone]

## 2013-07-13 NOTE — Progress Notes (Signed)
  I have seen and examined the patient, and reviewed the daily progress note by Junious Silk, MS and discussed the care of the patient with them. Please see my progress note from 07/13/2013 for further details regarding assessment and plan.    Signed:  Kennis Carina, MD 07/13/2013, 8:24 PM

## 2013-07-14 ENCOUNTER — Telehealth: Payer: Self-pay | Admitting: Licensed Clinical Social Worker

## 2013-07-14 LAB — GI PATHOGEN PANEL BY PCR, STOOL
Cryptosporidium by PCR: NEGATIVE
E coli (ETEC) LT/ST: NEGATIVE
E coli 0157 by PCR: NEGATIVE
G lamblia by PCR: NEGATIVE
Norovirus GI/GII: NEGATIVE
Salmonella by PCR: POSITIVE
Shigella by PCR: NEGATIVE

## 2013-07-14 NOTE — Telephone Encounter (Signed)
CSW left message for pt to stop at [facility] to submit application for housing as wait list is open.

## 2013-07-14 NOTE — Telephone Encounter (Signed)
[name] was referred to CSW for housing assistance.  Currently the wait list for [employer] is open.  CSW will refer [name] to [facility] or [employer] to submit application.  CSW placed called to pt.  CSW left message requesting return call. CSW provided contact hours and phone number.

## 2013-07-16 NOTE — Discharge Summary (Signed)
  Date: 07/16/2013  Patient name: [name]  Medical record number: [id number]  Date of birth: 07/14/1956   This patient has been seen and the plan of care was discussed with the house staff. Please see their note for complete details. I concur with their findings and plan.  Jonah Blue, DO, FACP Faculty [facility] Medicine Residency [facility] 07/16/2013, 3:31 PM

## 2013-07-16 NOTE — Telephone Encounter (Signed)
CSW placed call to Mr. Fetch.  Notified pt of open wait list for [employer].  Pt encouraged to complete application online ([email or web address]) or attempt to contact [facility] at [phone]. Pt aware and states he will follow through.  CSW will sign off at this time.

## 2013-07-18 ENCOUNTER — Encounter (HOSPITAL_COMMUNITY): Payer: Self-pay | Admitting: General Practice

## 2013-07-18 ENCOUNTER — Ambulatory Visit (HOSPITAL_COMMUNITY)
Admission: RE | Admit: 2013-07-18 | Discharge: 2013-07-18 | Disposition: A | Discharge status: Home / Self Care | Payer: No Typology Code available for payment source | Source: Ambulatory Visit | Attending: Internal Medicine | Admitting: Internal Medicine

## 2013-07-18 ENCOUNTER — Encounter: Payer: Self-pay | Admitting: Internal Medicine

## 2013-07-18 ENCOUNTER — Ambulatory Visit (INDEPENDENT_AMBULATORY_CARE_PROVIDER_SITE_OTHER): Payer: No Typology Code available for payment source | Admitting: Internal Medicine

## 2013-07-18 ENCOUNTER — Observation Stay (HOSPITAL_COMMUNITY)
Admission: AD | Admit: 2013-07-18 | Discharge: 2013-07-19 | Disposition: A | Discharge status: Home / Self Care | Payer: No Typology Code available for payment source | Source: Ambulatory Visit | Attending: Internal Medicine | Admitting: Internal Medicine

## 2013-07-18 VITALS — BP 167/90 | HR 63 | Temp 97.0°F | Ht 70.0 in | Wt 146.9 lb

## 2013-07-18 DIAGNOSIS — R42 Dizziness and giddiness: Secondary | ICD-10-CM

## 2013-07-18 DIAGNOSIS — A088 Other specified intestinal infections: Principal | ICD-10-CM | POA: Insufficient documentation

## 2013-07-18 DIAGNOSIS — B182 Chronic viral hepatitis C: Secondary | ICD-10-CM | POA: Insufficient documentation

## 2013-07-18 DIAGNOSIS — B1920 Unspecified viral hepatitis C without hepatic coma: Secondary | ICD-10-CM

## 2013-07-18 DIAGNOSIS — Z5900 Homelessness unspecified: Secondary | ICD-10-CM

## 2013-07-18 DIAGNOSIS — R638 Other symptoms and signs concerning food and fluid intake: Secondary | ICD-10-CM

## 2013-07-18 DIAGNOSIS — K859 Acute pancreatitis without necrosis or infection, unspecified: Secondary | ICD-10-CM

## 2013-07-18 DIAGNOSIS — F17200 Nicotine dependence, unspecified, uncomplicated: Secondary | ICD-10-CM | POA: Insufficient documentation

## 2013-07-18 DIAGNOSIS — R079 Chest pain, unspecified: Secondary | ICD-10-CM

## 2013-07-18 DIAGNOSIS — K529 Noninfective gastroenteritis and colitis, unspecified: Secondary | ICD-10-CM

## 2013-07-18 DIAGNOSIS — K861 Other chronic pancreatitis: Secondary | ICD-10-CM | POA: Insufficient documentation

## 2013-07-18 DIAGNOSIS — R0789 Other chest pain: Secondary | ICD-10-CM | POA: Insufficient documentation

## 2013-07-18 DIAGNOSIS — F1020 Alcohol dependence, uncomplicated: Secondary | ICD-10-CM

## 2013-07-18 DIAGNOSIS — R110 Nausea: Secondary | ICD-10-CM | POA: Diagnosis present

## 2013-07-18 DIAGNOSIS — E0590 Thyrotoxicosis, unspecified without thyrotoxic crisis or storm: Secondary | ICD-10-CM | POA: Insufficient documentation

## 2013-07-18 DIAGNOSIS — R109 Unspecified abdominal pain: Secondary | ICD-10-CM | POA: Diagnosis present

## 2013-07-18 DIAGNOSIS — K5289 Other specified noninfective gastroenteritis and colitis: Secondary | ICD-10-CM

## 2013-07-18 LAB — STOOL CULTURE

## 2013-07-18 LAB — CBC
HCT: 43.1 % (ref 39.0–52.0)
Hemoglobin: 15 g/dL (ref 13.0–17.0)
MCH: 34 pg (ref 26.0–34.0)
MCHC: 34.8 g/dL (ref 30.0–36.0)
MCHC: 34.4 g/dL (ref 30.0–36.0)
MCV: 98.7 fL (ref 78.0–100.0)
Platelets: 437 10*3/uL — ABNORMAL HIGH (ref 150–400)
Platelets: 382 10*3/uL (ref 150–400)
RBC: 4.37 MIL/uL (ref 4.22–5.81)
RBC: 3.71 MIL/uL — ABNORMAL LOW (ref 4.22–5.81)
RDW: 12.4 % (ref 11.5–15.5)

## 2013-07-18 LAB — URINALYSIS, ROUTINE W REFLEX MICROSCOPIC
Glucose, UA: NEGATIVE mg/dL
Hgb urine dipstick: NEGATIVE
Ketones, ur: NEGATIVE mg/dL
Leukocytes, UA: NEGATIVE
Nitrite: NEGATIVE
Protein, ur: NEGATIVE mg/dL
pH: 6 (ref 5.0–8.0)

## 2013-07-18 LAB — COMPLETE METABOLIC PANEL WITH GFR
ALT: 66 U/L — ABNORMAL HIGH (ref 0–53)
Albumin: 4 g/dL (ref 3.5–5.2)
Alkaline Phosphatase: 77 U/L (ref 39–117)
BUN: 14 mg/dL (ref 6–23)
CO2: 29 mEq/L (ref 19–32)
Calcium: 9.5 mg/dL (ref 8.4–10.5)
Chloride: 100 mEq/L (ref 96–112)
GFR, Est African American: >89 mL/min
GFR, Est Non African American: >89 mL/min
Glucose, Bld: 117 mg/dL — ABNORMAL HIGH (ref 70–99)
Potassium: 3.7 mEq/L (ref 3.5–5.3)
Total Bilirubin: 0.5 mg/dL (ref 0.3–1.2)

## 2013-07-18 LAB — LIPASE: Lipase: 36 U/L (ref 11–59)

## 2013-07-18 LAB — TROPONIN I: Troponin I: <0.3 ng/mL (ref <0.30)

## 2013-07-18 LAB — CREATININE, SERUM: Creatinine, Ser: 0.66 mg/dL (ref 0.50–1.35)

## 2013-07-18 MED ORDER — DICYCLOMINE HCL 20 MG PO TABS
20.0000 mg | ORAL_TABLET | Freq: Two times a day (BID) | ORAL | Status: DC
  Administered 2013-07-18 – 2013-07-19 (×2): 20 mg via ORAL
  Filled 2013-07-18 (×3): qty 1

## 2013-07-18 MED ORDER — ONDANSETRON HCL 4 MG/2ML IJ SOLN
4.0000 mg | Freq: Four times a day (QID) | INTRAMUSCULAR | Status: DC | PRN
  Administered 2013-07-18: 4 mg via INTRAVENOUS

## 2013-07-18 MED ORDER — SODIUM CHLORIDE 0.9 % IV SOLN
INTRAVENOUS | Status: DC
  Administered 2013-07-18 – 2013-07-19 (×3): via INTRAVENOUS

## 2013-07-18 MED ORDER — SODIUM CHLORIDE 0.9 % IJ SOLN: 3.0000 mL | Freq: Two times a day (BID) | INTRAMUSCULAR | Status: DC

## 2013-07-18 MED ORDER — ONDANSETRON HCL 4 MG/2ML IJ SOLN
INTRAMUSCULAR | Status: AC
  Administered 2013-07-18: 4 mg via INTRAVENOUS
  Filled 2013-07-18: qty 2

## 2013-07-18 MED ORDER — ASPIRIN EC 81 MG PO TBEC
81.0000 mg | DELAYED_RELEASE_TABLET | Freq: Every day | ORAL | Status: DC
  Filled 2013-07-18: qty 1

## 2013-07-18 MED ORDER — OXYCODONE HCL 5 MG PO TABS
5.0000 mg | ORAL_TABLET | ORAL | Status: DC | PRN
  Administered 2013-07-18 – 2013-07-19 (×4): 5 mg via ORAL
  Filled 2013-07-18 (×4): qty 1

## 2013-07-18 MED ORDER — ENOXAPARIN SODIUM 40 MG/0.4ML ~~LOC~~ SOLN
40.0000 mg | SUBCUTANEOUS | Status: DC
  Administered 2013-07-18: 40 mg via SUBCUTANEOUS
  Filled 2013-07-18 (×2): qty 0.4

## 2013-07-18 MED ORDER — ACETAMINOPHEN 650 MG RE SUPP: 650.0000 mg | Freq: Four times a day (QID) | RECTAL | Status: DC | PRN

## 2013-07-18 MED ORDER — SODIUM CHLORIDE 0.9 % IV SOLN: INTRAVENOUS | Status: DC

## 2013-07-18 MED ORDER — FAMOTIDINE 20 MG PO TABS
20.0000 mg | ORAL_TABLET | Freq: Two times a day (BID) | ORAL | Status: DC
  Administered 2013-07-18 – 2013-07-19 (×2): 20 mg via ORAL
  Filled 2013-07-18 (×3): qty 1

## 2013-07-18 MED ORDER — ACETAMINOPHEN 325 MG PO TABS: 650.0000 mg | ORAL_TABLET | Freq: Four times a day (QID) | ORAL | Status: DC | PRN

## 2013-07-18 MED ORDER — ONDANSETRON HCL 4 MG PO TABS: 4.0000 mg | ORAL_TABLET | Freq: Four times a day (QID) | ORAL | Status: DC | PRN

## 2013-07-18 MED ORDER — ASPIRIN EC 81 MG PO TBEC
81.0000 mg | DELAYED_RELEASE_TABLET | Freq: Every day | ORAL | Status: DC
  Administered 2013-07-18 – 2013-07-19 (×2): 81 mg via ORAL
  Filled 2013-07-18 (×2): qty 1

## 2013-07-18 MED ORDER — HYDROMORPHONE HCL PF 1 MG/ML IJ SOLN
0.5000 mg | Freq: Once | INTRAMUSCULAR | Status: AC
  Administered 2013-07-18: 0.5 mg via INTRAVENOUS
  Filled 2013-07-18: qty 1

## 2013-07-18 MED ORDER — BOOST / RESOURCE BREEZE PO LIQD: 1.0000 | Freq: Three times a day (TID) | ORAL | Status: DC

## 2013-07-18 NOTE — Assessment & Plan Note (Signed)
Smells of alcohol on exam. Patient denies any use over the past 2+ weeks. Checking serum alcohol level.
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
## 2013-07-18 NOTE — H&P (Signed)
  I have seen and examined the patient, and reviewed the admission note by Arna Snipe, MS 4 and discussed the care of the patient with them. Please see my progress note from 07/18/2013 for further details regarding assessment and plan.    Signed:  Darden Palmer, MD 07/18/2013, 10:21 PM

## 2013-07-18 NOTE — Assessment & Plan Note (Addendum)
Patient complains of left-sided chest pain that began today. He had similar chest pain at previous hospitalization for his acute pancreatitis, ACS was ruled out. EKG is unchanged from previous with nonspecific T-wave changes. Checking stat troponin. Admitting the patient to observation with telemetry. Will need to trend every 6 hours x3 rule out ACS.

## 2013-07-18 NOTE — Assessment & Plan Note (Signed)
Patient states he has 2 days of severe nausea and abdominal pain specimen midepigastrium, which is similar to his previous episode of acute alcoholic pancreatitis. Checking stat lipase, CMP, CBC, and serum EtOH levels. The patient denies recent alcohol use, exam room smells of alcohol. Starting normal saline at 150 cc an hour. Admitting the patient to observation with telemetry given his chest pain.

## 2013-07-19 LAB — GLUCOSE, CAPILLARY: Glucose-Capillary: 100 mg/dL — ABNORMAL HIGH (ref 70–99)

## 2013-07-19 MED ORDER — PROMETHAZINE HCL 12.5 MG PO TABS: 12.5000 mg | ORAL_TABLET | Freq: Three times a day (TID) | ORAL | Status: DC | PRN
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
## 2013-07-19 NOTE — Progress Notes (Signed)
  I have seen and examined the patient, and reviewed the daily progress note by Arna Snipe, MS 4 and discussed the care of the patient with them. Please see my progress note from 07/19/2013 for further details regarding assessment and plan.    Signed:  Darden Palmer, MD 07/19/2013, 1:01 PM

## 2013-07-19 NOTE — H&P (Signed)
I saw and evaluated the patient. I personally confirmed the key portions of Dr. Waynard Reeds history and exam and reviewed pertinent patient test results. The assessment, diagnosis, and plan were formulated together and I agree with the documentation in the resident's note.  Briefly, [name] is a 57 y.o. man with a recent history of EtOH pancreatitis, who presented to clinic with a 1 day history of nausea, vomiting, abdominal pain, diarrhea and inability to keep hydrated.  The initial concern was recurrent acute pancreatitis and the inability to keep himself orally hydrated given his homelessness status.  He was therefore admitted to the hospital for IVF hydration, anti-emetics, and pain control.  Since admission he was found to have a normal lipase.  He received 1 dose of dilaudid with resolution of his pain.  With the IV hydration he felt much improved by this morning and had no more abdominal pain or nausea/vomiting/diarrhea.  He likely had a viral gastroenteritis as a cause of his symptoms.  We will try him on a regular diet, and if tolerated, discharge him with follow-up in [facility].

## 2013-07-19 NOTE — Progress Notes (Signed)
Subjective: Patient was seen and examined at bedside.  He feels much better since admission and feels ready to eat a regular meal.  He denies any more episodes of diarrhea and chest pain.  He received one dose of Zofran yesterday and he says he has not had any nausea since.  His abdominal pain required one dose of IV dilaudid and home medications and have resolved since then.  He feels well enough to be discharged today if he is able to eat.  Objective: Vital signs in last 24 hours:  Vital Signs:   Filed Vitals:   07/18/13 1940 07/18/13 1943 07/18/13 2307 07/19/13 1000  BP: 111/68 105/74 108/61 113/61  Pulse: 57 60 57 53  Temp:   98.7 F (37.1 C) 98.2 F (36.8 C)  TempSrc:   Oral Oral  Resp:   18 18  Height:   5\' 10"  (1.778 m)   Weight:   60.374 kg (133 lb 1.6 oz)   SpO2:   98% 98%   Temp:  [97 F (36.1 C)-98.7 F (37.1 C)] 98.2 F (36.8 C) (10/18 1000) Pulse Rate:  [50-63] 53 (10/18 1000) Resp:  [18-20] 18 (10/18 1000) BP: (101-167)/(61-90) 113/61 mmHg (10/18 1000) SpO2:  [98 %-100 %] 98 % (10/18 1000) Weight:  [60.374 kg (133 lb 1.6 oz)-66.633 kg (146 lb 14.4 oz)] 60.374 kg (133 lb 1.6 oz) (10/17 2307) Last BM Date: 07/18/13  Intake/Output:   Intake/Output Summary (Last 24 hours) at 07/19/13 1139 Last data filed at 07/19/13 1000  Gross per 24 hour  Intake 1927.5 ml  Output    353 ml  Net 1574.5 ml      Physical Exam: General: Vital signs reviewed and noted. Well-developed, well-nourished, in no acute distress; alert, appropriate and cooperative throughout examination.  HEENT: Normocephalic, atraumatic, neck supple, moist mucous membranes  Lungs:  Normal respiratory effort. Clear to auscultation BL without crackles or wheezes.  Heart: RRR. S1 and S2 normal without gallop, murmur, or rubs.  Abdomen:  BS normoactive. Soft, Nondistended, non-tender.  No masses or organomegaly.  Extremities: No pretibial edema.  Warm and well-perfused   Lab Results: Basic Metabolic  Panel:  Recent Labs Lab 07/18/13 1521 07/18/13 1810  NA 140  --   K 3.7  --   CL 100  --   CO2 29  --   GLUCOSE 117*  --   BUN 14  --   CREATININE 0.91 0.66  CALCIUM 9.5  --     CBC:  Recent Labs Lab 07/12/13 1645 07/18/13 1521 07/18/13 1810  WBC 7.2 7.7 7.2  HGB 14.5 15.0 12.6*  HCT 40.8 43.1 36.6*  MCV 96.0 98.6 98.7  PLT 247 437* 382    Liver Function Tests:  Recent Labs Lab 07/18/13 1521  AST 41*  ALT 66*  ALKPHOS 77  BILITOT 0.5  PROT 8.0  ALBUMIN 4.0    Recent Labs Lab 07/18/13 1521  LIPASE 36    Cardiac Enzymes:  Recent Labs Lab 07/18/13 1521 07/18/13 2105 07/19/13 0300  TROPONINI <0.30 <0.30 <0.30   CBG:  Recent Labs Lab 07/19/13 0748  GLUCAP 100*    Microbiology: Results for orders placed during the hospital encounter of 07/10/13  STOOL CULTURE     Status: None   Collection Time    07/13/13  1:06 PM      Result Value Range Status   Specimen Description STOOL   Final   Special Requests Normal   Final   Culture  Final   Value: NO SALMONELLA, SHIGELLA, CAMPYLOBACTER, YERSINIA, OR E.COLI 0157:H7 ISOLATED     Performed at [facility]   Report Status 07/18/2013 FINAL   Final   Recent Results (from the past 240 hour(s))  STOOL CULTURE     Status: None   Collection Time    07/13/13  1:06 PM      Result Value Range Status   Specimen Description STOOL   Final   Special Requests Normal   Final   Culture     Final   Value: NO SALMONELLA, SHIGELLA, CAMPYLOBACTER, YERSINIA, OR E.COLI 0157:H7 ISOLATED     Performed at [facility]   Report Status 07/18/2013 FINAL   Final    Other results: EKG: sinus bradycardia   Medications: I have reviewed the patient's current medications. Scheduled Meds: . aspirin EC  81 mg Oral Daily  . dicyclomine  20 mg Oral BID  . enoxaparin (LOVENOX) injection  40 mg Subcutaneous Q24H  . famotidine  20 mg Oral BID  . sodium chloride  3 mL Intravenous Q12H   Continuous  Infusions: . sodium chloride 125 mL/hr at 07/19/13 0959   PRN Meds:.acetaminophen, acetaminophen, ondansetron (ZOFRAN) IV, ondansetron, oxyCODONE  Assessment/Plan: Principal Problem:   Gastroenteritis Active Problems:   Alcohol dependence   Chest pain   Homelessness   Summary: [name] is a 57 y.o. with PMH of GERD, HCV, chronic EtOH abuse and homelessness who was admitted for gastroenteritis   Impression & Plan: 1. Gastroenteritis: likely viral, possibly bacterial given his recent +PCR for salmonella, however given his normal WBC and lack of fever or signs of infection, treatment is supportive care only.  His symptoms have largely improved and he denies any fever or chills.  He has not had any more diarrhea since the initial two episodes he had on day of admission.   - advance diet  - PRN pain meds - zofran prn for nausea - will provide Rx for Zofran at discharge - continue pepcid  2. Chest pain: atypical, unlikely ACS given the short-lived nature of his pain and no EKG findings or troponin levels consistent with ishemia - ASA - consider statin therapy but pt is unlikely to adhere due to costs  3. Thrombocytosis: - likely reactive, currently resolved  4. Chronic EtOh abuse/dependence: - continue to encourage abstinence  - contact social work for resources  5. Homelessness: - [place] woods and has high likelihood of returning back to hospital - contact social work for [employer] resources  Dispo:  Possible discharge today pending toleration of full regular PO diet and resolution of symptoms.  This is a Psychologist, occupational Note.  The care of the patient was discussed with Dr. Virgina Organ and the assessment and plan formulated with their assistance.  Please see their attached note for official documentation of the daily encounter.   LOS: 1 day   Signed: Arna Snipe Internal Medicine, MS4 Pager [phone] 07/19/2013, 11:39 AM

## 2013-07-19 NOTE — Progress Notes (Signed)
I saw and evaluated the patient.  I personally confirmed the key portions of Dr. Glenn's history and exam and reviewed pertinent patient test results.  The assessment, diagnosis, and plan were formulated together and I agree with the documentation in the resident's note. 

## 2013-07-22 ENCOUNTER — Encounter: Payer: Self-pay | Admitting: Internal Medicine

## 2013-07-22 ENCOUNTER — Ambulatory Visit (INDEPENDENT_AMBULATORY_CARE_PROVIDER_SITE_OTHER): Payer: No Typology Code available for payment source | Admitting: Internal Medicine

## 2013-07-22 VITALS — BP 138/83 | HR 62 | Temp 97.7°F | Wt 146.5 lb

## 2013-07-22 DIAGNOSIS — K5289 Other specified noninfective gastroenteritis and colitis: Secondary | ICD-10-CM

## 2013-07-22 DIAGNOSIS — K529 Noninfective gastroenteritis and colitis, unspecified: Secondary | ICD-10-CM

## 2013-07-22 DIAGNOSIS — Z0289 Encounter for other administrative examinations: Secondary | ICD-10-CM

## 2013-07-22 DIAGNOSIS — Z79899 Other long term (current) drug therapy: Secondary | ICD-10-CM

## 2013-07-22 DIAGNOSIS — F1020 Alcohol dependence, uncomplicated: Secondary | ICD-10-CM

## 2013-07-22 NOTE — Progress Notes (Signed)
Case discussed with Dr. Glenn at the time of the visit.  We reviewed the resident's history and exam and pertinent patient test results.  I agree with the assessment, diagnosis, and plan of care documented in the resident's note.   

## 2013-07-22 NOTE — Addendum Note (Signed)
Addended by: Charlsie Merles F on: 07/22/2013 12:09 PM   Modules accepted: Level of Service

## 2013-07-22 NOTE — Assessment & Plan Note (Signed)
The patient has been without alcohol for 3 weeks. Blood alcohol level was less than 11 on 10/17. Patient is to attend outpatient rehabilitation beginning some time very soon, and appears excited and ready to continue to avoid alcohol use.

## 2013-07-22 NOTE — Assessment & Plan Note (Signed)
Patient admitted with nausea, vomiting, diarrhea on 10/17. Stool cultures negative. Nausea, vomiting, and diarrhea have all resolved at this time. However the patient is requesting Phenergan for when he does have nausea. A prescription was sent to the [facility] at [facility] at hospital discharge. The patient states he will pick this up.

## 2013-07-22 NOTE — Assessment & Plan Note (Signed)
Mr. Cummiskey continues to have abdominal pain, and states he's taking his oxycodone 5 mg 2 tablets every 4 hours, instead of 1 tablet. He states that he will run out of his medication before it is time to be refills, because he's been taking double the amount, secondary to his pain. He is requesting that his oxycodone changed to 10 mg tablets.   However when asked about the OxyContin he was prescribed on 10/9, he states that he threw them away because they were making him sick. I informed the patient that because he "threw away "than her chronic medication he was just prescribed, I am unable to increase his immediate acting oxycodone at this time, and he will have to have his pain medication refilled at his regular refill date. I also discussed with him that in the future if he were to "throw away "another narcotic medication but that would be grounds for voiding his pain contract; he acknowledged understanding.   I also discussed with him that the pain he is experiencing should improve as it is likely secondary to his gastroenteritis. In looking at his CT scan he has no evidence of chronic pancreatitis, and his lipase was normal at his last hospitalization, so he should not be having pain from his pancreas.
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
## 2013-07-22 NOTE — Discharge Summary (Signed)
Name: [name] MRN: [id number] DOB: 06-May-1956 57 y.o. PCP: Evelena Peat, DO  Date of Admission: 07/18/2013  4:12 PM Date of Discharge: 07/22/2013 Attending Physician: Dr. Josem Kaufmann Discharge Diagnosis:  Principal Problem:   Gastroenteritis Active Problems:   Homelessness   Alcohol dependence   Chest pain  Discharge Medications:   Medication List    STOP taking these medications       ondansetron 4 MG tablet  Commonly known as:  ZOFRAN      TAKE these medications       aspirin 81 MG EC tablet  Take 1 tablet (81 mg total) by mouth daily.     dicyclomine 20 MG tablet  Commonly known as:  BENTYL  Take 20 mg by mouth 2 (two) times daily.     famotidine 20 MG tablet  Commonly known as:  PEPCID  Take 1 tablet (20 mg total) by mouth 2 (two) times daily.     feeding supplement (RESOURCE BREEZE) Liqd  Take 1 Container by mouth 3 (three) times daily between meals.     oxyCODONE 5 MG immediate release tablet  Commonly known as:  Oxy IR/ROXICODONE  Take 5 mg by mouth every 4 (four) hours as needed for pain.     OxyCODONE 10 mg T12a 12 hr tablet  Commonly known as:  OXYCONTIN  Take 10 mg by mouth every 12 (twelve) hours.     promethazine 12.5 MG tablet  Commonly known as:  PHENERGAN  Take 1 tablet (12.5 mg total) by mouth every 8 (eight) hours as needed for nausea.       Disposition and follow-up:   [name] was discharged from [facility] in Stable condition.  At the hospital follow up visit please address:  1.   Gastroenteritis: supportive measures.  Pain control: unhappy with prescribed oxycodone per pain contract Homeless: housing  Follow-up Appointments:     Follow-up Information   Follow up with Evelena Peat, DO. Schedule an appointment as soon as possible for a visit in 1 week.   Specialty:  Internal Medicine   Contact information:   [address] [place] [address] [phone]      Discharge  Instructions: Discharge Orders   Future Orders Complete By Expires   Call MD for:  difficulty breathing, headache or visual disturbances  As directed    Call MD for:  persistant dizziness or light-headedness  As directed    Call MD for:  persistant nausea and vomiting  As directed    Call MD for:  severe uncontrolled pain  As directed    Call MD for:  temperature >100.4  As directed    Diet - low sodium heart healthy  As directed    Increase activity slowly  As directed      Procedures Performed:  Dg Chest 2 View  07/11/2013   CLINICAL DATA:  Chest pain. Smoker.  EXAM: CHEST  2 VIEW  COMPARISON:  05/25/2013  FINDINGS: Heart, mediastinum hila are within normal limits.  The lungs are mildly hyperexpanded. There is hazy focal opacity that projects in the right mid to lower lung on the frontal view that is not evident as a discrete opacity on the lateral view. This is likely superimposed structures. There is mild reticular opacity on the lateral view that projects within the right middle lobe, likely scarring. There is no infiltrate. There is no pulmonary edema.  An old left clavicle fracture has been reduced with a  fixation plate and screws. There are associated old healed posterior left rib fractures.  IMPRESSION: No acute cardiopulmonary disease.   Electronically Signed   By: Amie Portland M.D.   On: 07/11/2013 15:49   Ct Abdomen Pelvis W Contrast  07/13/2013   CLINICAL DATA:  Refractory abdominal pain and nausea. History of pancreatitis. Chronic alcohol abuse.  EXAM: CT ABDOMEN AND PELVIS WITH CONTRAST  TECHNIQUE: Multidetector CT imaging of the abdomen and pelvis was performed using the standard protocol following bolus administration of intravenous contrast.  CONTRAST:  OMNIPAQUE IOHEXOL 300 MG/ML  SOLN  COMPARISON:  Abdomen and pelvis radiographs obtained yesterday. CT dated 03/11/2013.  FINDINGS: Cholecystectomy clips and additional upper abdominal surgical clips. Multiple tiny bilateral  renal cysts and small right renal cysts. Multiple tiny penile calcifications. Stable small, lobulated remaining portion of the spleen, status post partial splenectomy by history.  Normal appearing liver, pancreas, adrenal glands and urinary bladder. Normal-sized prostate gland with minimal calcification. No gastrointestinal abnormalities or enlarged lymph nodes. Normal appearing appendix. Minimal dependent atelectasis at the left lung base. Multiple old, healed right rib fractures. Lumbar spine degenerative changes.  IMPRESSION: No acute abnormality.   Electronically Signed   By: Gordan Payment M.D.   On: 07/13/2013 01:58   Dg Abd 2 Views  07/12/2013   CLINICAL DATA:  Left-sided abdominal pain with persistent nausea.  EXAM: ABDOMEN - 2 VIEW  COMPARISON:  05/25/2013.  FINDINGS: Normal bowel gas pattern. Mild increased stool in the colon. No free air.  Dense splenic artery calcifications are noted in the left upper quadrant. There are vascular clips in the central upper abdomen and in the right upper quadrant, also stable.  The soft tissues are otherwise unremarkable. The lung bases are clear. There are degenerative changes at L4-L5.  IMPRESSION: 1. No acute findings. 2. Mild increased stool in the colon.   Electronically Signed   By: Amie Portland M.D.   On: 07/12/2013 11:47   Admission HPI: [name] is a 57 year old white homeless male with PMH of GERD, Hepatitis C, chronic alcohol dependence, chronic pancreatitis, and hyperthyroidism who presented to [facility] today for hospital follow up appointment. During that visit he complained again of abdominal pain, nausea, diarrhea, and left sided chest pain x2 days. He was recently admitted to the hospital for similar complaints on 07/10/13 and discharged on 07/13/13 after symptoms improved. He claims he was doing well s/p discharge, however, symptoms returned on Wednesday after he ate a hamburger the night before.  In regards to his abdominal pain: he says the pain is  severe, non-radiating, diffuse, associated with nausea, decreased oral intake, and diarrhea along with occasional difficulty in taking a deep breath due to pain. He denies recent alcohol use with last drink ~2 weeks ago. Of note, on last admission stool pathogen + for salmonella, however, stool culture was negative and he had no more episodes of diarrhea (x2 this morning) since admission. He denies fever but does endorse occasional night sweats and has been cold since he is homeless and sleeps in a tent with a sleeping bag.  [name] also endorses left sided chest pain, which he feels is different from before. The pain is non-radiating, intermittent, spontaneously resolves lasting only a few seconds, non-tender to palpation, and sharp, but felt more with movement. He has trouble taking a deep breath at times due to his abdominal pain and has hx of dizziness upon standing as well.   Hospital Course by problem list:  Principal Problem:   Gastroenteritis Active Problems:   Homelessness   Alcohol dependence   Chest pain  Viral Gastroenteritis--Resolved during hospital course with IVF hydration and supportive measures.  No diarrhea during admission.  Nausea resolved with Zofran.  He did not get his Zofran filled from last admission as he says he was unable to get his son to help with the cost but says now he can get assistance.  We prescribed short course of phenergan instead to help with nausea and also provided a coupon that will make the medication hopefully more affordable.  Initially, [name] presented to [facility] with diffuse abdominal pain, non-bloody diarrhea, and nausea with dry heaves x2 days with a similar presentation to prior admission earlier this month. Gastritis is certainly included in the differential given chronic alcohol abuse and confirmed moderate pan-gastritis and esophagitis per last EGD from 01/03/13 by Dr. Christella Hartigan. Pancreatitis was less likely given Lipase of 36 and prompt resolution of  symptoms with supportive measures.  Bacterial etiology of symptoms also seemed less likely, although prior stool pathogen was pcr positive for salmonella. However, there was no clear indication to treat with antibiotics at given improvement of symptoms upon prior discharge, no fever, and few episodes of diarrhea which also resolved. [name] is recommended to continue pepcid and monitor his nutrition with adequate fluid intake.  He is also recommended to get his medications filled including phenergan for nausea that seems to be a more affordable options.  He says he will do so and was able to tolerate regular diet.  He will need to follow up with pcp as outpatient.   Atypical chest pain--left sided, resolved during hospital course. Likely GI vs. MSK given hx of chronic left sided pain s/p L clavicular fracture. Pain was intermittent and lasting only a few seconds and resolved promptly with narcotics. ACS was ruled out given negative cardiac enzymes and no obvious ischemic changes on EKG. He is recommended to continue home medicine of aspirin and pcp may consider statin therapy given LDL of 142 05/2013, however, affordability may be a barrier.  Smoking cessation has been strongly advised to [name] this admission as well.  Hopefully he will be able to do so.  Follow up with pcp.   Alcohol dependence--improving. Claimed he has stopped and has not drank alcohol for the past 2 weeks. EtOH was <11 on admission. We continued to encourage cessation. Follow up with social work in [place].   Homeless--[place]. Has been given resources for shelters by social work on prior admissions. Unfortunately, he does not have family close by. Clinical social work from [facility] did contact patient two days prior to admission in regards to open wait list for [facility]. Recommended to continue social work follow up as needed and find a more stable living situation.   Discharge Vitals:   BP 118/58  Pulse 55   Temp(Src) 98 F (36.7 C) (Oral)  Resp 18  Ht 5\' 10"  (1.778 m)  Wt 133 lb 1.6 oz (60.374 kg)  BMI 19.1 kg/m2  SpO2 97%  Discharge Labs:  Basic Metabolic Panel:  Recent Labs Lab 07/18/13 1521 07/18/13 1810  NA 140  --   K 3.7  --   CL 100  --   CO2 29  --   GLUCOSE 117*  --   BUN 14  --   CREATININE 0.91 0.66  CALCIUM 9.5  --    Liver Function Tests:  Recent Labs Lab  07/18/13 1521  AST 41*  ALT 66*  ALKPHOS 77  BILITOT 0.5  PROT 8.0  ALBUMIN 4.0    Recent Labs Lab 07/18/13 1521  LIPASE 36   CBC:  Recent Labs Lab 07/18/13 1521 07/18/13 1810  WBC 7.7 7.2  HGB 15.0 12.6*  HCT 43.1 36.6*  MCV 98.6 98.7  PLT 437* 382   Cardiac Enzymes:  Recent Labs Lab 07/18/13 1521 07/18/13 2105 07/19/13 0300  TROPONINI <0.30 <0.30 <0.30   CBG:  Recent Labs Lab 07/19/13 0748  GLUCAP 100*   Urine Drug Screen: Drugs of Abuse     Component Value Date/Time   LABOPIA POSITIVE* 07/11/2013 0305   LABOPIA PPS 04/18/2013 1446   COCAINSCRNUR NONE DETECTED 07/11/2013 0305   COCAINSCRNUR NEG 04/18/2013 1446   LABBENZ POSITIVE* 07/11/2013 0305   LABBENZ PPS 04/18/2013 1446   AMPHETMU NONE DETECTED 07/11/2013 0305   THCU NONE DETECTED 07/11/2013 0305   THCU 27 12/02/2012 1121   LABBARB NONE DETECTED 07/11/2013 0305   LABBARB NEG 04/18/2013 1446    Alcohol Level:  Recent Labs Lab 07/18/13 1551  ETH <11   Urinalysis:  Recent Labs Lab 07/18/13 2129  COLORURINE AMBER*  LABSPEC 1.025  PHURINE 6.0  GLUCOSEU NEGATIVE  HGBUR NEGATIVE  BILIRUBINUR NEGATIVE  KETONESUR NEGATIVE  PROTEINUR NEGATIVE  UROBILINOGEN 1.0  NITRITE NEGATIVE  LEUKOCYTESUR NEGATIVE    Signed: Darden Palmer, MD 07/22/2013, 11:45 AM   Time Spent on Discharge: 40 minutes Services Ordered on Discharge: needs to arrange for shelter Equipment Ordered on Discharge: none

## 2013-07-25 ENCOUNTER — Other Ambulatory Visit: Payer: Self-pay | Admitting: *Deleted

## 2013-07-25 NOTE — Telephone Encounter (Signed)
Pt informed of refill denial. Stated he was told he could double up on Oxycodone since he threw away Oxycontin. Dr Andrey Campanile next available appt is Dec 3. Next refill not until 11/14.

## 2013-07-25 NOTE — Telephone Encounter (Signed)
The most recent clinic note (Dr. Sherrine Maples) states that he will not get an increased prescription.  Not sure which provider told him to double up, but based on the information I have, I do not feel comfortable refilling.  He can be seen by an [facility] provider if no openings with Dr. Andrey Campanile until December.   I will refuse Rx.   Thanks

## 2013-07-25 NOTE — Telephone Encounter (Signed)
Per his pain contract, he gets 90 monthly.  He just picked up an Rx for 90 tabs on 10/14, so would not be able to get a new Rx until 11/14.  If he is having increased pain, he should be seen in clinic prior to further prescriptions being written.  If he wants to go ahead and pick up the prescription today, it will be noted that it should not be filled until 08/15/13.   Thanks!

## 2013-07-25 NOTE — Telephone Encounter (Signed)
Dr Criselda Peaches, can u deny refill request?  Thanks

## 2013-07-30 NOTE — Addendum Note (Signed)
Addended by: Dorie Rank E on: 07/30/2013 11:12 AM   Modules accepted: Orders

## 2013-08-04 ENCOUNTER — Ambulatory Visit (INDEPENDENT_AMBULATORY_CARE_PROVIDER_SITE_OTHER): Payer: No Typology Code available for payment source | Admitting: Internal Medicine

## 2013-08-04 ENCOUNTER — Encounter: Payer: Self-pay | Admitting: Internal Medicine

## 2013-08-04 VITALS — BP 111/74 | HR 87 | Temp 97.3°F | Ht 70.0 in | Wt 147.6 lb

## 2013-08-04 DIAGNOSIS — F1020 Alcohol dependence, uncomplicated: Secondary | ICD-10-CM

## 2013-08-04 DIAGNOSIS — Z0289 Encounter for other administrative examinations: Secondary | ICD-10-CM

## 2013-08-04 DIAGNOSIS — G8929 Other chronic pain: Secondary | ICD-10-CM

## 2013-08-04 DIAGNOSIS — Z79899 Other long term (current) drug therapy: Secondary | ICD-10-CM

## 2013-08-04 DIAGNOSIS — R109 Unspecified abdominal pain: Secondary | ICD-10-CM

## 2013-08-04 MED ORDER — OXYCODONE HCL 5 MG PO TABS: 5.0000 mg | ORAL_TABLET | ORAL | Status: DC | PRN

## 2013-08-04 MED ORDER — OMEPRAZOLE 40 MG PO CPDR: 40.0000 mg | DELAYED_RELEASE_CAPSULE | Freq: Every day | ORAL | Status: DC
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
## 2013-08-04 NOTE — Progress Notes (Signed)
I saw and evaluated the patient.  I personally confirmed the key portions of the history and exam documented by Dr. Chikowski and I reviewed pertinent patient test results.  The assessment, diagnosis, and plan were formulated together and I agree with the documentation in the resident's note. 

## 2013-08-04 NOTE — Patient Instructions (Signed)
Thank you for your visit today. Please follow up with me in two weeks.  I gave you enough pain medication to last you through the next two weeks until you see me next. We also added omeprazole 40mg  daily to your medication regimen today. This medication should help your stomach pain.

## 2013-08-04 NOTE — Assessment & Plan Note (Signed)
Patient history of alcohol abuse and withdrawal. However his last drink was a month and a half ago. He intends to enroll in substance abuse rehabilitation after Thanksgiving this year.

## 2013-08-04 NOTE — Assessment & Plan Note (Signed)
The etiology of his abdominal pain is still unclear. I suspect that patient still has an element of gastritis and or esophagitis that was previously diagnosed on EGD in 2014. CT scan done during his last admission does not show evidence of chronic pancreatitis, therefore do not believe this is the cause of his pain. Patient was discharged from the hospital last month with the diagnosis of gastroenteritis. I believe that patient abstaining from alcohol will alone improve his symptoms over time, however I recommend the addition of a PPI to his medication regimen. Given he had already been involved in a pain contract with Dr. Shirlee Latch, I will continue prescribing narcotics for this patient for the time being. Patient reports that OxyContin makes him  nauseated, so I will prescribe oxycodone 10 mg Q8 hours #45 with a close followup appointment in 2 weeks to assess progression of symptoms.  Patient is aware that he is subject to random urine drug screens and that he should not throw away any more narcotic prescriptions as this is a red flag. He is also aware that he will not be able to obtain early refills. I also prescribed omeprazole 40 mg daily. Patient reports that his son will be taking him to the pharmacy to obtain these medications today. If patient's pain does not improve over the next few weeks, I may need to refer him to GI again.

## 2013-08-14 ENCOUNTER — Ambulatory Visit: Payer: Self-pay

## 2013-08-18 ENCOUNTER — Encounter: Payer: Self-pay | Admitting: Internal Medicine

## 2013-08-18 ENCOUNTER — Ambulatory Visit (INDEPENDENT_AMBULATORY_CARE_PROVIDER_SITE_OTHER): Payer: Self-pay | Admitting: Internal Medicine

## 2013-08-18 VITALS — BP 133/86 | HR 93 | Temp 98.7°F | Ht 70.0 in | Wt 144.1 lb

## 2013-08-18 DIAGNOSIS — Z79899 Other long term (current) drug therapy: Secondary | ICD-10-CM

## 2013-08-18 DIAGNOSIS — F10239 Alcohol dependence with withdrawal, unspecified: Secondary | ICD-10-CM

## 2013-08-18 DIAGNOSIS — G8929 Other chronic pain: Secondary | ICD-10-CM

## 2013-08-18 DIAGNOSIS — R109 Unspecified abdominal pain: Secondary | ICD-10-CM

## 2013-08-18 DIAGNOSIS — M25561 Pain in right knee: Secondary | ICD-10-CM

## 2013-08-18 DIAGNOSIS — Z0289 Encounter for other administrative examinations: Secondary | ICD-10-CM

## 2013-08-18 DIAGNOSIS — M25569 Pain in unspecified knee: Secondary | ICD-10-CM

## 2013-08-18 MED ORDER — OXYCODONE HCL 10 MG PO TABS: 10.0000 mg | ORAL_TABLET | Freq: Three times a day (TID) | ORAL | Status: DC

## 2013-08-18 MED ORDER — OXYCODONE-ACETAMINOPHEN 10-325 MG PO TABS: 1.0000 | ORAL_TABLET | Freq: Three times a day (TID) | ORAL | Status: DC | PRN

## 2013-08-18 MED ORDER — OXYCODONE HCL 10 MG PO TABS: 10.0000 mg | ORAL_TABLET | ORAL | Status: DC | PRN

## 2013-08-18 NOTE — Assessment & Plan Note (Signed)
Patient's abdominal pain seems to be improving. Given his past alcohol use and previously documented EGD findings of gastritis and esophagitis, I believe his abdominal pain is caused by gastritis and/ or esophagitis. I think the best medication to treat and ultimately resolve his pain is a PPI. Unfortunately, patient did not fill his PPI prescription after his last visit. I emphasized the importance of patient filling this medication as I believe this will read the root of his abdominal pain rather than mask it like narcotics. He should appears to understand this concept and will do his best to obtain this prescription. I asked him to followup with me after he has taken a PPI for 2 weeks so we can evaluate how well is working, preferably within 2-3 weeks. I discussed with him that narcotics are not a good medication to be on chronically for this type of pain. Ultimately if patient's pain does not resolve with PPI treatment, he may need referral to GI. Patient's orange card had expired and he will need this renewed for we can do any more referrals. I would lean away from refilling his narcotic prescription during his next visit if he still has not obtained the PPI prescription.

## 2013-08-18 NOTE — Assessment & Plan Note (Signed)
Patient given oxycodone 10mg  q8h prn, #45. I believe that if patient is compliant with his PPI, he will either no longer require narcotics or he will require less. Therefore, I did not resign a pain contract with the new agreement since the amount of narcotic medication may change.

## 2013-08-18 NOTE — Patient Instructions (Addendum)
Thank you for your visit.  I prescribed oxycodone 10mg  every 8 hours as needed for pain, 45 pills only. Please get your omeprazole prescription filled. This is very important! I would like you to follow up with me in 2-3 weeks- after you have taken the omeprazole for at least 2 weeks. I believe that the omeprazole will help your stomach pain more than the pain medication.

## 2013-08-18 NOTE — Assessment & Plan Note (Signed)
Patient with acute on chronic right knee pain. He will followup with [facility] orthopedics in December of this year. Patient's pain should be well controlled with oxycodone he is receiving for his other types of pain. I will defer further management to [facility] orthopedics.

## 2013-08-18 NOTE — Assessment & Plan Note (Signed)
Patient continues to abstain from alcohol x 2 months now.
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
## 2013-08-20 ENCOUNTER — Encounter: Payer: Self-pay | Admitting: Internal Medicine

## 2013-08-21 NOTE — Progress Notes (Signed)
I saw and evaluated the patient.  I personally confirmed the key portions of the history and exam documented by Dr. Chikowski and I reviewed pertinent patient test results.  The assessment, diagnosis, and plan were formulated together and I agree with the documentation in the resident's note. 

## 2013-08-22 ENCOUNTER — Ambulatory Visit: Payer: Self-pay

## 2013-08-27 ENCOUNTER — Other Ambulatory Visit: Payer: Self-pay | Admitting: *Deleted

## 2013-09-01 ENCOUNTER — Ambulatory Visit (INDEPENDENT_AMBULATORY_CARE_PROVIDER_SITE_OTHER): Payer: Self-pay | Admitting: Internal Medicine

## 2013-09-01 ENCOUNTER — Encounter: Payer: Self-pay | Admitting: Internal Medicine

## 2013-09-01 VITALS — BP 134/86 | HR 101 | Temp 97.7°F | Ht 70.0 in | Wt 145.3 lb

## 2013-09-01 DIAGNOSIS — G8929 Other chronic pain: Secondary | ICD-10-CM

## 2013-09-01 DIAGNOSIS — R109 Unspecified abdominal pain: Secondary | ICD-10-CM

## 2013-09-01 DIAGNOSIS — Z79899 Other long term (current) drug therapy: Secondary | ICD-10-CM

## 2013-09-01 DIAGNOSIS — M25561 Pain in right knee: Secondary | ICD-10-CM

## 2013-09-01 DIAGNOSIS — M25569 Pain in unspecified knee: Secondary | ICD-10-CM

## 2013-09-01 DIAGNOSIS — Z0289 Encounter for other administrative examinations: Secondary | ICD-10-CM

## 2013-09-01 MED ORDER — OMEPRAZOLE 40 MG PO CPDR: 40.0000 mg | DELAYED_RELEASE_CAPSULE | Freq: Every day | ORAL | Status: DC

## 2013-09-01 MED ORDER — OXYCODONE HCL 10 MG PO TABS: 10.0000 mg | ORAL_TABLET | Freq: Three times a day (TID) | ORAL | Status: DC

## 2013-09-01 NOTE — Assessment & Plan Note (Addendum)
Pt still with R knee pain, which he states has worsened over the past few days/weeks. His narcotic pain medication was increased to 10mg  q8 PRN from 5mg  on 11/17, but despite this, his pain has persisted/worsened. He was prescribed #45 tablets, which should have lasted him for 15 days, but he states that he ran out yesterday, 2 days early. He is to f/u with Orthopedics at [facility] on Wed., so I will prescribe him #9 more tablets to get him through Wed- of note, the pt was very angry that I did not prescribe him more narcotic medication. My hope is that the orthopedist will have some other method of pain control other than narcotic medication to provide for the patient - Oxycodone 10 mg 1 tablet Q8 hours when necessary pain, dispense #9

## 2013-09-01 NOTE — Assessment & Plan Note (Signed)
Patient states that he has been taking his Prilosec daily; however his ex-wife states that he has not. Per patient, he ran out of the Prilosec last Thursday, which was 08/28/13. He states that Prilosec has improved his abdominal pain, but he is still having pain during the first hour after he eats. He is taking a baby aspirin daily. I think he needs to go back to GI for reevaluation. Unfortunately, at this time, his orange card has expired; patient states he is going to meet with Jaynee Eagles sometime this week to renew his orange card.  - Continue Prilosec daily, prescription provided with 11 refills.

## 2013-09-01 NOTE — Progress Notes (Signed)
Case discussed with Dr. Glenn soon after the resident saw the patient.  We reviewed the resident's history and exam and pertinent patient test results.  I agree with the assessment, diagnosis, and plan of care documented in the resident's note. 
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
## 2013-09-01 NOTE — Patient Instructions (Addendum)
Please keep your appointment with the orthopedist at [facility] on Wednesday.   Please call the clinic after you receive your [facility] Card so we can refer you back to the GI specialist.

## 2013-09-03 ENCOUNTER — Encounter: Payer: Self-pay | Admitting: Internal Medicine

## 2013-09-03 ENCOUNTER — Other Ambulatory Visit: Payer: Self-pay | Admitting: *Deleted

## 2013-09-03 DIAGNOSIS — G8929 Other chronic pain: Secondary | ICD-10-CM

## 2013-09-03 DIAGNOSIS — M25561 Pain in right knee: Secondary | ICD-10-CM

## 2013-09-03 NOTE — Telephone Encounter (Signed)
Pt states he went to [facility] today and will not be seen for another month.  He is asking for refill on pain med.  He runs out today. Pt # [phone]

## 2013-09-04 ENCOUNTER — Ambulatory Visit: Payer: Self-pay | Admitting: Internal Medicine

## 2013-09-04 ENCOUNTER — Other Ambulatory Visit: Payer: Self-pay | Admitting: Internal Medicine

## 2013-09-04 ENCOUNTER — Telehealth: Payer: Self-pay | Admitting: *Deleted

## 2013-09-04 DIAGNOSIS — M25561 Pain in right knee: Secondary | ICD-10-CM

## 2013-09-04 DIAGNOSIS — G8929 Other chronic pain: Secondary | ICD-10-CM

## 2013-09-04 MED ORDER — OXYCODONE-ACETAMINOPHEN 10-325 MG PO TABS: 1.0000 | ORAL_TABLET | Freq: Three times a day (TID) | ORAL | Status: DC | PRN

## 2013-09-04 NOTE — Telephone Encounter (Signed)
I do not think [name] needs to be seen today, as I just saw him 3 days ago. I will refill his narcotic for one month with #90 tablets. This is the last narcotic prescription that I will provide [name], and from now on he will need to get his narcotics from his Orthopedist if that is what the Orthopedist feels that the patient needs. [name] was made aware of this and acknowledged understanding to our triage nurse.

## 2013-09-04 NOTE — Telephone Encounter (Signed)
Pt came to clinic and is asking about pain meds.  He will bring information on where to send his records.  He is seeing Dr. Birdena Crandall at [facility]. He states he is taking Prilosec 40 mg once a day and plans to continue taking this.  His family member got them for pt. He returns to [facility] Jan. 13.  Pt's ex wife is trying to arrange for housing for pt.  This doctor will not give pain meds until this appointment. Dr. Roselyn Bering also told pt he needs to stop smoking. Pt is working on this also.  Will see today in clinic

## 2013-09-10 ENCOUNTER — Emergency Department (HOSPITAL_COMMUNITY)
Admission: EM | Admit: 2013-09-10 | Discharge: 2013-09-10 | Disposition: A | Discharge status: Home / Self Care | Payer: Self-pay | Attending: Emergency Medicine | Admitting: Emergency Medicine

## 2013-09-10 ENCOUNTER — Encounter (HOSPITAL_COMMUNITY): Payer: Self-pay | Admitting: Emergency Medicine

## 2013-09-10 DIAGNOSIS — Z7982 Long term (current) use of aspirin: Secondary | ICD-10-CM | POA: Insufficient documentation

## 2013-09-10 DIAGNOSIS — M19019 Primary osteoarthritis, unspecified shoulder: Secondary | ICD-10-CM | POA: Insufficient documentation

## 2013-09-10 DIAGNOSIS — Z8639 Personal history of other endocrine, nutritional and metabolic disease: Secondary | ICD-10-CM | POA: Insufficient documentation

## 2013-09-10 DIAGNOSIS — F17200 Nicotine dependence, unspecified, uncomplicated: Secondary | ICD-10-CM | POA: Insufficient documentation

## 2013-09-10 DIAGNOSIS — M1710 Unilateral primary osteoarthritis, unspecified knee: Secondary | ICD-10-CM | POA: Insufficient documentation

## 2013-09-10 DIAGNOSIS — G8929 Other chronic pain: Secondary | ICD-10-CM | POA: Insufficient documentation

## 2013-09-10 DIAGNOSIS — Z79899 Other long term (current) drug therapy: Secondary | ICD-10-CM | POA: Insufficient documentation

## 2013-09-10 DIAGNOSIS — K219 Gastro-esophageal reflux disease without esophagitis: Secondary | ICD-10-CM | POA: Insufficient documentation

## 2013-09-10 DIAGNOSIS — Z8619 Personal history of other infectious and parasitic diseases: Secondary | ICD-10-CM | POA: Insufficient documentation

## 2013-09-10 DIAGNOSIS — Z8781 Personal history of (healed) traumatic fracture: Secondary | ICD-10-CM | POA: Insufficient documentation

## 2013-09-10 DIAGNOSIS — Z8659 Personal history of other mental and behavioral disorders: Secondary | ICD-10-CM | POA: Insufficient documentation

## 2013-09-10 DIAGNOSIS — Z8701 Personal history of pneumonia (recurrent): Secondary | ICD-10-CM | POA: Insufficient documentation

## 2013-09-10 DIAGNOSIS — Z862 Personal history of diseases of the blood and blood-forming organs and certain disorders involving the immune mechanism: Secondary | ICD-10-CM | POA: Insufficient documentation

## 2013-09-10 DIAGNOSIS — F1010 Alcohol abuse, uncomplicated: Secondary | ICD-10-CM

## 2013-09-10 LAB — COMPREHENSIVE METABOLIC PANEL
AST: 159 U/L — ABNORMAL HIGH (ref 0–37)
Albumin: 4 g/dL (ref 3.5–5.2)
BUN: 9 mg/dL (ref 6–23)
Calcium: 8.9 mg/dL (ref 8.4–10.5)
Creatinine, Ser: 0.68 mg/dL (ref 0.50–1.35)
GFR calc non Af Amer: >90 mL/min (ref >90)
Total Protein: 8 g/dL (ref 6.0–8.3)

## 2013-09-10 LAB — CBC
HCT: 46.5 % (ref 39.0–52.0)
MCH: 35.1 pg — ABNORMAL HIGH (ref 26.0–34.0)
MCHC: 35.5 g/dL (ref 30.0–36.0)
MCV: 98.9 fL (ref 78.0–100.0)
Platelets: 347 10*3/uL (ref 150–400)
RBC: 4.7 MIL/uL (ref 4.22–5.81)
RDW: 12.4 % (ref 11.5–15.5)

## 2013-09-10 LAB — RAPID URINE DRUG SCREEN, HOSP PERFORMED
Amphetamines: NOT DETECTED
Cocaine: NOT DETECTED
Opiates: NOT DETECTED
Tetrahydrocannabinol: POSITIVE — AB

## 2013-09-10 LAB — ETHANOL: Alcohol, Ethyl (B): 126 mg/dL — ABNORMAL HIGH (ref 0–11)

## 2013-09-10 NOTE — ED Notes (Addendum)
Pt states he is going to a treatment facility and needs to be medically cleared prior to going.  Pt is getting detox for alcohol.  Last drink an hour ago, 40oz beer.

## 2013-09-10 NOTE — ED Notes (Addendum)
Patients watch, wallet and cellphone were given to Penni Bombard patients friend who is at bedside.

## 2013-09-10 NOTE — ED Notes (Signed)
Patient is placed in paper scrubs and all belongings are bagged and removed from room. Security is called for wanding.
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
## 2013-09-19 ENCOUNTER — Inpatient Hospital Stay (HOSPITAL_COMMUNITY)
Admission: EM | Admit: 2013-09-19 | Discharge: 2013-09-24 | DRG: 087 | Disposition: A | Discharge status: Home / Self Care | Payer: Self-pay | Attending: General Surgery | Admitting: General Surgery

## 2013-09-19 ENCOUNTER — Emergency Department (HOSPITAL_COMMUNITY): Payer: Self-pay

## 2013-09-19 ENCOUNTER — Encounter (HOSPITAL_COMMUNITY): Payer: Self-pay | Admitting: Emergency Medicine

## 2013-09-19 DIAGNOSIS — F10229 Alcohol dependence with intoxication, unspecified: Secondary | ICD-10-CM | POA: Diagnosis present

## 2013-09-19 DIAGNOSIS — M545 Low back pain, unspecified: Secondary | ICD-10-CM | POA: Diagnosis present

## 2013-09-19 DIAGNOSIS — B1920 Unspecified viral hepatitis C without hepatic coma: Secondary | ICD-10-CM | POA: Diagnosis present

## 2013-09-19 DIAGNOSIS — S8002XA Contusion of left knee, initial encounter: Secondary | ICD-10-CM

## 2013-09-19 DIAGNOSIS — F1020 Alcohol dependence, uncomplicated: Secondary | ICD-10-CM | POA: Diagnosis present

## 2013-09-19 DIAGNOSIS — R109 Unspecified abdominal pain: Secondary | ICD-10-CM | POA: Diagnosis present

## 2013-09-19 DIAGNOSIS — F411 Generalized anxiety disorder: Secondary | ICD-10-CM | POA: Diagnosis present

## 2013-09-19 DIAGNOSIS — S06309A Unspecified focal traumatic brain injury with loss of consciousness of unspecified duration, initial encounter: Secondary | ICD-10-CM

## 2013-09-19 DIAGNOSIS — S61412A Laceration without foreign body of left hand, initial encounter: Secondary | ICD-10-CM

## 2013-09-19 DIAGNOSIS — F17200 Nicotine dependence, unspecified, uncomplicated: Secondary | ICD-10-CM | POA: Diagnosis present

## 2013-09-19 DIAGNOSIS — N400 Enlarged prostate without lower urinary tract symptoms: Secondary | ICD-10-CM | POA: Diagnosis present

## 2013-09-19 DIAGNOSIS — IMO0002 Reserved for concepts with insufficient information to code with codable children: Secondary | ICD-10-CM | POA: Diagnosis present

## 2013-09-19 DIAGNOSIS — Y998 Other external cause status: Secondary | ICD-10-CM

## 2013-09-19 DIAGNOSIS — M129 Arthropathy, unspecified: Secondary | ICD-10-CM | POA: Diagnosis present

## 2013-09-19 DIAGNOSIS — Y92410 Unspecified street and highway as the place of occurrence of the external cause: Secondary | ICD-10-CM

## 2013-09-19 DIAGNOSIS — G8929 Other chronic pain: Secondary | ICD-10-CM | POA: Diagnosis present

## 2013-09-19 DIAGNOSIS — S06300A Unspecified focal traumatic brain injury without loss of consciousness, initial encounter: Principal | ICD-10-CM | POA: Diagnosis present

## 2013-09-19 DIAGNOSIS — F10929 Alcohol use, unspecified with intoxication, unspecified: Secondary | ICD-10-CM

## 2013-09-19 DIAGNOSIS — E0590 Thyrotoxicosis, unspecified without thyrotoxic crisis or storm: Secondary | ICD-10-CM | POA: Diagnosis present

## 2013-09-19 DIAGNOSIS — K219 Gastro-esophageal reflux disease without esophagitis: Secondary | ICD-10-CM | POA: Diagnosis present

## 2013-09-19 DIAGNOSIS — E785 Hyperlipidemia, unspecified: Secondary | ICD-10-CM | POA: Diagnosis present

## 2013-09-19 DIAGNOSIS — F4542 Pain disorder with related psychological factors: Secondary | ICD-10-CM | POA: Diagnosis present

## 2013-09-19 LAB — CBC WITH DIFFERENTIAL/PLATELET
Basophils Absolute: 0 10*3/uL (ref 0.0–0.1)
Basophils Relative: 1 % (ref 0–1)
Eosinophils Absolute: 0 10*3/uL (ref 0.0–0.7)
Eosinophils Relative: 0 % (ref 0–5)
Hemoglobin: 15.7 g/dL (ref 13.0–17.0)
Lymphs Abs: 4.3 10*3/uL — ABNORMAL HIGH (ref 0.7–4.0)
MCH: 33.8 pg (ref 26.0–34.0)
MCHC: 35.5 g/dL (ref 30.0–36.0)
MCV: 95.3 fL (ref 78.0–100.0)
Monocytes Relative: 10 % (ref 3–12)
Neutrophils Relative %: 35 % — ABNORMAL LOW (ref 43–77)
Platelets: 319 10*3/uL (ref 150–400)
RDW: 12.4 % (ref 11.5–15.5)

## 2013-09-19 LAB — POCT I-STAT, CHEM 8
BUN: 4 mg/dL — ABNORMAL LOW (ref 6–23)
Calcium, Ion: 1.15 mmol/L (ref 1.12–1.23)
Chloride: 98 mEq/L (ref 96–112)
Creatinine, Ser: 1.1 mg/dL (ref 0.50–1.35)
Glucose, Bld: 108 mg/dL — ABNORMAL HIGH (ref 70–99)
HCT: 47 % (ref 39.0–52.0)
Hemoglobin: 16 g/dL (ref 13.0–17.0)
Potassium: 3.9 mEq/L (ref 3.5–5.1)
TCO2: 24 mmol/L (ref 0–100)

## 2013-09-19 LAB — SAMPLE TO BLOOD BANK

## 2013-09-19 LAB — APTT: aPTT: 30 seconds (ref 24–37)

## 2013-09-19 MED ORDER — MORPHINE SULFATE 4 MG/ML IJ SOLN: 2.0000 mg | Freq: Once | INTRAMUSCULAR | Status: DC

## 2013-09-19 MED ORDER — FENTANYL CITRATE 0.05 MG/ML IJ SOLN
50.0000 ug | Freq: Once | INTRAMUSCULAR | Status: AC
  Administered 2013-09-19: 50 ug via INTRAVENOUS
  Filled 2013-09-19: qty 2

## 2013-09-19 MED ORDER — IOHEXOL 300 MG/ML  SOLN
100.0000 mL | Freq: Once | INTRAMUSCULAR | Status: AC | PRN
  Administered 2013-09-19: 100 mL via INTRAVENOUS
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
## 2013-09-19 NOTE — ED Notes (Signed)
Pt back from CT
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
## 2013-09-20 ENCOUNTER — Encounter (HOSPITAL_COMMUNITY): Payer: Self-pay | Admitting: General Surgery

## 2013-09-20 DIAGNOSIS — I619 Nontraumatic intracerebral hemorrhage, unspecified: Secondary | ICD-10-CM

## 2013-09-20 DIAGNOSIS — S06309A Unspecified focal traumatic brain injury with loss of consciousness of unspecified duration, initial encounter: Secondary | ICD-10-CM

## 2013-09-20 DIAGNOSIS — K861 Other chronic pancreatitis: Secondary | ICD-10-CM

## 2013-09-20 DIAGNOSIS — S065X9A Traumatic subdural hemorrhage with loss of consciousness of unspecified duration, initial encounter: Secondary | ICD-10-CM

## 2013-09-20 LAB — MRSA PCR SCREENING: MRSA by PCR: NEGATIVE

## 2013-09-20 MED ORDER — FOLIC ACID 1 MG PO TABS
1.0000 mg | ORAL_TABLET | Freq: Every day | ORAL | Status: DC
  Administered 2013-09-20 – 2013-09-24 (×5): 1 mg via ORAL
  Filled 2013-09-20 (×5): qty 1

## 2013-09-20 MED ORDER — LORAZEPAM 1 MG PO TABS
1.0000 mg | ORAL_TABLET | Freq: Four times a day (QID) | ORAL | Status: AC | PRN
  Administered 2013-09-21 – 2013-09-22 (×2): 1 mg via ORAL
  Filled 2013-09-20 (×2): qty 1

## 2013-09-20 MED ORDER — BOOST / RESOURCE BREEZE PO LIQD
1.0000 | Freq: Two times a day (BID) | ORAL | Status: DC
  Administered 2013-09-20 – 2013-09-24 (×8): 1 via ORAL

## 2013-09-20 MED ORDER — LORAZEPAM 2 MG/ML IJ SOLN
0.0000 mg | Freq: Two times a day (BID) | INTRAMUSCULAR | Status: AC
  Administered 2013-09-22: 2 mg via INTRAVENOUS
  Administered 2013-09-22: 1 mg via INTRAVENOUS
  Administered 2013-09-23 (×2): 2 mg via INTRAVENOUS
  Filled 2013-09-20 (×4): qty 1
  Filled 2013-09-20: qty 2

## 2013-09-20 MED ORDER — THIAMINE HCL 100 MG/ML IJ SOLN
100.0000 mg | Freq: Every day | INTRAMUSCULAR | Status: DC
  Filled 2013-09-20 (×5): qty 1

## 2013-09-20 MED ORDER — HYDROMORPHONE HCL PF 1 MG/ML IJ SOLN
0.5000 mg | INTRAMUSCULAR | Status: DC | PRN
  Administered 2013-09-20 – 2013-09-23 (×13): 0.5 mg via INTRAVENOUS
  Filled 2013-09-20 (×13): qty 1

## 2013-09-20 MED ORDER — OXYCODONE-ACETAMINOPHEN 5-325 MG PO TABS
1.0000 | ORAL_TABLET | Freq: Three times a day (TID) | ORAL | Status: DC | PRN
  Administered 2013-09-20 – 2013-09-23 (×8): 1 via ORAL
  Filled 2013-09-20 (×11): qty 1

## 2013-09-20 MED ORDER — PANTOPRAZOLE SODIUM 40 MG IV SOLR
40.0000 mg | Freq: Every day | INTRAVENOUS | Status: DC
  Administered 2013-09-23: 40 mg via INTRAVENOUS
  Filled 2013-09-20 (×4): qty 40

## 2013-09-20 MED ORDER — PANTOPRAZOLE SODIUM 40 MG PO TBEC
40.0000 mg | DELAYED_RELEASE_TABLET | Freq: Every day | ORAL | Status: DC
Start: 2013-09-20 — End: 2013-09-23
  Administered 2013-09-20 – 2013-09-22 (×3): 40 mg via ORAL
  Filled 2013-09-20 (×2): qty 1

## 2013-09-20 MED ORDER — ONDANSETRON HCL 4 MG/2ML IJ SOLN
4.0000 mg | Freq: Four times a day (QID) | INTRAMUSCULAR | Status: DC | PRN
  Administered 2013-09-20: 4 mg via INTRAVENOUS
  Filled 2013-09-20: qty 2

## 2013-09-20 MED ORDER — DEXTROSE IN LACTATED RINGERS 5 % IV SOLN
INTRAVENOUS | Status: DC
  Administered 2013-09-20 (×2): via INTRAVENOUS

## 2013-09-20 MED ORDER — OXYCODONE-ACETAMINOPHEN 10-325 MG PO TABS: 1.0000 | ORAL_TABLET | Freq: Three times a day (TID) | ORAL | Status: DC | PRN

## 2013-09-20 MED ORDER — OXYCODONE HCL 5 MG PO TABS
5.0000 mg | ORAL_TABLET | Freq: Three times a day (TID) | ORAL | Status: DC | PRN
  Administered 2013-09-20 – 2013-09-23 (×8): 5 mg via ORAL
  Filled 2013-09-20 (×8): qty 1

## 2013-09-20 MED ORDER — LORAZEPAM 2 MG/ML IJ SOLN
0.0000 mg | Freq: Four times a day (QID) | INTRAMUSCULAR | Status: AC
  Administered 2013-09-20 (×2): 1 mg via INTRAVENOUS
  Administered 2013-09-20 – 2013-09-22 (×6): 2 mg via INTRAVENOUS
  Filled 2013-09-20 (×7): qty 1

## 2013-09-20 MED ORDER — VITAMIN B-1 100 MG PO TABS
100.0000 mg | ORAL_TABLET | Freq: Every day | ORAL | Status: DC
  Administered 2013-09-20 – 2013-09-24 (×5): 100 mg via ORAL
  Filled 2013-09-20 (×5): qty 1

## 2013-09-20 MED ORDER — ONDANSETRON HCL 4 MG PO TABS: 4.0000 mg | ORAL_TABLET | Freq: Four times a day (QID) | ORAL | Status: DC | PRN

## 2013-09-20 MED ORDER — ADULT MULTIVITAMIN W/MINERALS CH
1.0000 | ORAL_TABLET | Freq: Every day | ORAL | Status: DC
  Administered 2013-09-20 – 2013-09-24 (×5): 1 via ORAL
  Filled 2013-09-20 (×5): qty 1

## 2013-09-20 MED ORDER — LORAZEPAM 2 MG/ML IJ SOLN
1.0000 mg | Freq: Four times a day (QID) | INTRAMUSCULAR | Status: AC | PRN
  Administered 2013-09-20 – 2013-09-21 (×3): 1 mg via INTRAVENOUS
  Filled 2013-09-20 (×5): qty 1

## 2013-09-20 MED ORDER — FENTANYL CITRATE 0.05 MG/ML IJ SOLN
50.0000 ug | Freq: Once | INTRAMUSCULAR | Status: AC
  Administered 2013-09-20: 50 ug via INTRAVENOUS
  Filled 2013-09-20: qty 2
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
## 2013-09-20 NOTE — Progress Notes (Signed)
Utilization Review completed.  

## 2013-09-20 NOTE — Progress Notes (Signed)
Patient ID: [name], male   DOB: 12/12/1955, 57 y.o.   MRN: [id number] Afeb,vss No new neuro issues; remains awake and alert; Will recheck CT head today but doubt surgical problem present.

## 2013-09-20 NOTE — ED Provider Notes (Signed)
Medical screening examination/treatment/procedure(s) were performed by non-physician practitioner and as supervising physician I was immediately available for consultation/collaboration.  EKG Interpretation    Date/Time:    Ventricular Rate:    PR Interval:    QRS Duration:   QT Interval:    QTC Calculation:   R Axis:     Text Interpretation:                Charles B. Bernette Mayers, MD 09/20/13 [phone]
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
## 2013-09-20 NOTE — ED Notes (Signed)
Neuro surgery at bedside.

## 2013-09-21 ENCOUNTER — Inpatient Hospital Stay (HOSPITAL_COMMUNITY): Payer: Self-pay

## 2013-09-21 MED ORDER — NICOTINE 21 MG/24HR TD PT24
21.0000 mg | MEDICATED_PATCH | Freq: Every day | TRANSDERMAL | Status: DC
  Administered 2013-09-21 – 2013-09-24 (×4): 21 mg via TRANSDERMAL
  Filled 2013-09-21 (×4): qty 1
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
## 2013-09-22 MED ORDER — BACITRACIN ZINC 500 UNIT/GM EX OINT
TOPICAL_OINTMENT | Freq: Every day | CUTANEOUS | Status: DC
  Administered 2013-09-22 – 2013-09-24 (×3): via TOPICAL
  Filled 2013-09-22: qty 28.35

## 2013-09-22 MED ORDER — SPIRITUS FRUMENTI
1.0000 | Freq: Three times a day (TID) | ORAL | Status: DC
  Administered 2013-09-22 – 2013-09-24 (×5): 1 via ORAL
  Filled 2013-09-22 (×13): qty 1

## 2013-09-22 MED ORDER — TRAMADOL HCL 50 MG PO TABS
50.0000 mg | ORAL_TABLET | Freq: Four times a day (QID) | ORAL | Status: DC | PRN
  Administered 2013-09-22: 50 mg via ORAL
  Filled 2013-09-22: qty 1

## 2013-09-22 NOTE — Progress Notes (Signed)
Subjective: Patient reports not feeling great.  Objective: Vital signs in last 24 hours: Temp:  [97.6 F (36.4 C)-98.4 F (36.9 C)] 97.6 F (36.4 C) (12/22 0300) Pulse Rate:  [63-81] 75 (12/22 0300) Resp:  [11-12] 12 (12/22 0300) BP: (122-133)/(69-85) 122/75 mmHg (12/22 0300) SpO2:  [96 %-97 %] 96 % (12/22 0300)  Intake/Output from previous day: 12/21 0701 - 12/22 0700 In: 1095 [P.O.:270; I.V.:825] Out: 1150 [Urine:1150] Intake/Output this shift:    Physical Exam: Awake, alert, conversant.  Ambulating with walker with PT  Lab Results:  Recent Labs  09/19/13 1915 09/19/13 1922  WBC 7.9  --   HGB 15.7 16.0  HCT 44.2 47.0  PLT 319  --    BMET  Recent Labs  09/19/13 1922  NA 136  K 3.9  CL 98  GLUCOSE 108*  BUN 4*  CREATININE 1.10    Studies/Results: Ct Head Wo Contrast  09/21/2013   CLINICAL DATA:  Followup intra cerebral hemorrhage  EXAM: CT HEAD WITHOUT CONTRAST  TECHNIQUE: Contiguous axial images were obtained from the base of the skull through the vertex without intravenous contrast.  COMPARISON:  09/19/2013  FINDINGS: Again noted is the area of hemorrhage along the right corpus callosum and projecting into the right lateral ventricle. This measures 13 x 7 mm, not significantly changed since prior study. The layering blood within the lateral ventricles has decreased. No hydrocephalus. No new areas of hemorrhage.  IMPRESSION: Stable area of hemorrhage along the right corpus callosum body. Decreasing intraventricular blood. No hydrocephalus.   Electronically Signed   By: Charlett Nose M.D.   On: 09/21/2013 07:35    Assessment/Plan: Progressing.No new neurosurgical recs.  Please call with questions.      LOS: 3 days    Dorian Heckle, MD 09/22/2013, 8:40 AM
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
## 2013-09-23 LAB — BASIC METABOLIC PANEL
BUN: 12 mg/dL (ref 6–23)
CO2: 25 mEq/L (ref 19–32)
Chloride: 100 mEq/L (ref 96–112)
GFR calc Af Amer: >90 mL/min (ref >90)
GFR calc non Af Amer: >90 mL/min (ref >90)
Potassium: 4 mEq/L (ref 3.5–5.1)
Sodium: 136 mEq/L (ref 135–145)

## 2013-09-23 MED ORDER — OXYCODONE-ACETAMINOPHEN 10-325 MG PO TABS: 1.0000 | ORAL_TABLET | Freq: Three times a day (TID) | ORAL | Status: DC | PRN

## 2013-09-23 MED ORDER — HYDROMORPHONE HCL PF 1 MG/ML IJ SOLN
0.5000 mg | INTRAMUSCULAR | Status: DC | PRN
  Administered 2013-09-23 – 2013-09-24 (×2): 0.5 mg via INTRAVENOUS
  Filled 2013-09-23 (×2): qty 1

## 2013-09-23 MED ORDER — ENOXAPARIN SODIUM 40 MG/0.4ML ~~LOC~~ SOLN
40.0000 mg | SUBCUTANEOUS | Status: DC
  Administered 2013-09-23: 40 mg via SUBCUTANEOUS
  Filled 2013-09-23 (×2): qty 0.4

## 2013-09-23 MED ORDER — TRAMADOL HCL 50 MG PO TABS
50.0000 mg | ORAL_TABLET | Freq: Four times a day (QID) | ORAL | Status: DC | PRN
  Administered 2013-09-23 (×2): 100 mg via ORAL
  Administered 2013-09-24 (×2): 50 mg via ORAL
  Filled 2013-09-23 (×2): qty 2
  Filled 2013-09-23: qty 1
  Filled 2013-09-23: qty 2

## 2013-09-23 MED ORDER — LORAZEPAM 2 MG/ML IJ SOLN
2.0000 mg | INTRAMUSCULAR | Status: DC | PRN
  Administered 2013-09-24: 2 mg via INTRAVENOUS
  Filled 2013-09-23: qty 1

## 2013-09-23 NOTE — Progress Notes (Signed)
D/W Dr. Venetia Maxon - OK for Lovenox 40QD. Therapies rec SNF. Patient examined and I agree with the assessment and plan  Violeta Gelinas, MD, MPH, FACS Pager: [phone]  09/23/2013 10:51 AM

## 2013-09-23 NOTE — Clinical Social Work Placement (Signed)
Clinical Social Work Department CLINICAL SOCIAL WORK PLACEMENT NOTE 09/23/2013  Patient:  [name], [name]  Account Number:  [id number] Admit date:  09/19/2013  Clinical Social Worker:  Macario Golds, LCSW  Date/time:  09/23/2013 04:30 PM  Clinical Social Work is seeking post-discharge placement for this patient at the following level of care:   SKILLED NURSING   (*CSW will update this form in Epic as items are completed)   09/23/2013  Patient/family provided with [facility] Department of Clinical Social Work's list of facilities offering this level of care within the geographic area requested by the patient (or if unable, by the patient's family).  09/23/2013  Patient/family informed of their freedom to choose among providers that offer the needed level of care, that participate in Medicare, Medicaid or managed care program needed by the patient, have an available bed and are willing to accept the patient.  09/23/2013  Patient/family informed of [facility]' ownership interest in [facility], as well as of the fact that they are under no obligation to receive care at this facility.  PASARR submitted to EDS on  PASARR number received from EDS on   FL2 transmitted to all facilities in geographic area requested by pt/family on  09/23/2013 FL2 transmitted to all facilities within larger geographic area on 09/23/2013  Patient informed that his/her managed care company has contracts with or will negotiate with  certain facilities, including the following:     Patient/family informed of bed offers received:   Patient chooses bed at  Physician recommends and patient chooses bed at    Patient to be transferred to  on   Patient to be transferred to facility by   The following physician request were entered in Epic:   Additional Comments: 12/23 Patient will be LOG placement

## 2013-09-23 NOTE — Clinical Social Work Note (Addendum)
Clinical Social Worker continuing to follow patient for support and discharge planning needs.  CSW spoke with patient at bedside who states that he is working with [employer] who is going to provide him transportation and he is making arrangements to visit with his children for the holidays.  Patient plans to eventually return to his [place] the woods.  CSW offered patient SNF option, however patient politely declined, stating that he needed to see his children.  CSW spoke with PT over the phone who states that patient could be safe discharging home with adequate supervision - patient feels he will have that at discharge.  Clinical Social Worker will sign off for now as social work intervention is no longer needed. Please consult [place] again if new need arises.  Macario Golds, [place] [phone]   16:35  CSW received phone call from CM stating, patient has changed his mind and is now in agreement with SNF placement.  CSW has initiated search to facilities who tend to take potential LOG patients.  CSW to follow up with patient regarding any potential bed offers available.  PA/CM aware of potential delays due to LOG placement.  CSW remains available for support and to facilitate patient discharge needs once medically stable.  Macario Golds, [place] [phone]
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
## 2013-09-23 NOTE — Progress Notes (Signed)
Patient ID: [name], male   DOB: Jun 19, 1956, 57 y.o.   MRN: [id number]   LOS: 4 days   Subjective: C/o HA, medication helping.   Objective: Vital signs in last 24 hours: Temp:  [97.5 F (36.4 C)-97.8 F (36.6 C)] 97.7 F (36.5 C) (12/23 4132) Pulse Rate:  [60-83] 76 (12/23 0638) Resp:  [14-18] 18 (12/23 4401) BP: (110-149)/(62-78) 110/65 mmHg (12/23 0638) SpO2:  [94 %-99 %] 94 % (12/23 0638) Last BM Date: 09/20/13   Laboratory  BMET  Recent Labs  09/23/13 0558  NA 136  K 4.0  CL 100  CO2 25  GLUCOSE 127*  BUN 12  CREATININE 0.66  CALCIUM 8.9    Physical Exam General appearance: alert and no distress Resp: clear to auscultation bilaterally Cardio: regular rate and rhythm GI: normal findings: bowel sounds normal and soft, non-tender   Assessment/Plan: [facility]  TBI w/IVH - TBI teams  L hand abrasion - bacitracin  ETOH abuse - CIWA, beer FEN - Will try tramadol only for pain, has dilaudid for breakthrough VTE - SCD's, will check with NS regarding adding Lovenox  DIspo - TBI team recommending SNF, placement will be difficult    Freeman Caldron, PA-C Pager: [phone] General Trauma PA Pager: [phone]   09/23/2013

## 2013-09-23 NOTE — Progress Notes (Signed)
As D/C instructions were being read to pt and friend, pt decided that he would listen to previous DC rec's and seek SNF placement.  MD notified, pt aware that this could take several days, pt re-educated about fall prevention safety plan.  He agreed to comply with the bed alarm and waiting for placement. Will continue to monitor.
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
## 2013-09-23 NOTE — Discharge Summary (Signed)
Orlanda Frankum, MD, MPH, FACS Pager: [phone]  
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
## 2013-09-24 MED ORDER — OXYCODONE-ACETAMINOPHEN 10-325 MG PO TABS: 1.0000 | ORAL_TABLET | ORAL | Status: DC | PRN

## 2013-09-24 MED ORDER — TRAMADOL HCL 50 MG PO TABS: 50.0000 mg | ORAL_TABLET | Freq: Four times a day (QID) | ORAL | Status: DC | PRN

## 2013-09-24 NOTE — Progress Notes (Signed)
Pt for discharge today,  several attempts to locate a shelter/facility for him to go to after leaving the hospital were made without any solution.  [name], a friend of the pt is to pick him up at 1500.  Pt is refusing to stay in the hospital so that he can go fill his Rx and smoke.  Pt encouraged to stay until [facility] can get here to pick him up but pt refused to stay, asking for discharge papers.  Pt found walking the halls in an attempt to leave.  DC papers read/signed with RN. Pt walked out of the hospital after formal discharge.

## 2013-09-24 NOTE — Discharge Summary (Signed)
Miguel Gruhn, MD, MPH, FACS Pager: [phone]  

## 2013-09-24 NOTE — ED Provider Notes (Signed)
Medical screening examination/treatment/procedure(s) were performed by non-physician practitioner and as supervising physician I was immediately available for consultation/collaboration.  EKG Interpretation    Date/Time:    Ventricular Rate:    PR Interval:    QRS Duration:   QT Interval:    QTC Calculation:   R Axis:     Text Interpretation:               Shelda Jakes, MD 09/24/13 [phone]

## 2013-09-24 NOTE — Clinical Social Work Note (Signed)
CSW received call from unit director asking for help in assisting patient with housing issues. CSW provided patient with shelter list for [facility]. Patient states that he used all of his shelter days, CSW asked how long ago was his last night in the shelter. Patient stated that this was over a year ago. Patient can receive 67 more days in shelter after being out of the shelter for 6 months. Patient is currently refusing to go to a shelter and [place] CSW and RN have encouraged patient to try and get into a shelter, but understand that patient has the right to make his own decisions.  Roddie Mc, [place], [place], [phone]

## 2013-10-04 ENCOUNTER — Emergency Department (HOSPITAL_COMMUNITY)
Admission: EM | Admit: 2013-10-04 | Discharge: 2013-10-04 | Disposition: A | Discharge status: Home / Self Care | Payer: Self-pay | Attending: Emergency Medicine | Admitting: Emergency Medicine

## 2013-10-04 ENCOUNTER — Emergency Department (HOSPITAL_COMMUNITY): Payer: Self-pay

## 2013-10-04 ENCOUNTER — Other Ambulatory Visit: Payer: Self-pay

## 2013-10-04 ENCOUNTER — Encounter (HOSPITAL_COMMUNITY): Payer: Self-pay | Admitting: Emergency Medicine

## 2013-10-04 DIAGNOSIS — M2550 Pain in unspecified joint: Secondary | ICD-10-CM | POA: Insufficient documentation

## 2013-10-04 DIAGNOSIS — Z87448 Personal history of other diseases of urinary system: Secondary | ICD-10-CM | POA: Insufficient documentation

## 2013-10-04 DIAGNOSIS — M25519 Pain in unspecified shoulder: Secondary | ICD-10-CM | POA: Insufficient documentation

## 2013-10-04 DIAGNOSIS — Z8619 Personal history of other infectious and parasitic diseases: Secondary | ICD-10-CM | POA: Insufficient documentation

## 2013-10-04 DIAGNOSIS — R51 Headache: Secondary | ICD-10-CM | POA: Insufficient documentation

## 2013-10-04 DIAGNOSIS — M25512 Pain in left shoulder: Secondary | ICD-10-CM

## 2013-10-04 DIAGNOSIS — F1010 Alcohol abuse, uncomplicated: Secondary | ICD-10-CM | POA: Insufficient documentation

## 2013-10-04 DIAGNOSIS — Z8639 Personal history of other endocrine, nutritional and metabolic disease: Secondary | ICD-10-CM | POA: Insufficient documentation

## 2013-10-04 DIAGNOSIS — Z8781 Personal history of (healed) traumatic fracture: Secondary | ICD-10-CM | POA: Insufficient documentation

## 2013-10-04 DIAGNOSIS — R109 Unspecified abdominal pain: Secondary | ICD-10-CM

## 2013-10-04 DIAGNOSIS — K861 Other chronic pancreatitis: Secondary | ICD-10-CM | POA: Insufficient documentation

## 2013-10-04 DIAGNOSIS — R1084 Generalized abdominal pain: Secondary | ICD-10-CM | POA: Insufficient documentation

## 2013-10-04 DIAGNOSIS — G8929 Other chronic pain: Secondary | ICD-10-CM | POA: Insufficient documentation

## 2013-10-04 DIAGNOSIS — F17200 Nicotine dependence, unspecified, uncomplicated: Secondary | ICD-10-CM | POA: Insufficient documentation

## 2013-10-04 DIAGNOSIS — Z8709 Personal history of other diseases of the respiratory system: Secondary | ICD-10-CM | POA: Insufficient documentation

## 2013-10-04 DIAGNOSIS — Z8659 Personal history of other mental and behavioral disorders: Secondary | ICD-10-CM | POA: Insufficient documentation

## 2013-10-04 DIAGNOSIS — R519 Headache, unspecified: Secondary | ICD-10-CM

## 2013-10-04 DIAGNOSIS — Z862 Personal history of diseases of the blood and blood-forming organs and certain disorders involving the immune mechanism: Secondary | ICD-10-CM | POA: Insufficient documentation

## 2013-10-04 DIAGNOSIS — Z8701 Personal history of pneumonia (recurrent): Secondary | ICD-10-CM | POA: Insufficient documentation

## 2013-10-04 LAB — CBC WITH DIFFERENTIAL/PLATELET
BASOS ABS: 0.1 10*3/uL (ref 0.0–0.1)
Basophils Relative: 1 % (ref 0–1)
Eosinophils Absolute: 0.1 10*3/uL (ref 0.0–0.7)
Eosinophils Relative: 1 % (ref 0–5)
HCT: 45.9 % (ref 39.0–52.0)
HEMOGLOBIN: 16.5 g/dL (ref 13.0–17.0)
Lymphocytes Relative: 35 % (ref 12–46)
Lymphs Abs: 3.2 10*3/uL (ref 0.7–4.0)
MCH: 34.4 pg — ABNORMAL HIGH (ref 26.0–34.0)
MCHC: 35.9 g/dL (ref 30.0–36.0)
MCV: 95.8 fL (ref 78.0–100.0)
Monocytes Absolute: 1 10*3/uL (ref 0.1–1.0)
Monocytes Relative: 11 % (ref 3–12)
NEUTROS ABS: 4.9 10*3/uL (ref 1.7–7.7)
NEUTROS PCT: 53 % (ref 43–77)
Platelets: 380 10*3/uL (ref 150–400)
RBC: 4.79 MIL/uL (ref 4.22–5.81)
RDW: 12.7 % (ref 11.5–15.5)
WBC: 9.1 10*3/uL (ref 4.0–10.5)

## 2013-10-04 LAB — COMPREHENSIVE METABOLIC PANEL
ALBUMIN: 3.8 g/dL (ref 3.5–5.2)
ALK PHOS: 97 U/L (ref 39–117)
ALT: 177 U/L — AB (ref 0–53)
AST: 160 U/L — ABNORMAL HIGH (ref 0–37)
BUN: 4 mg/dL — ABNORMAL LOW (ref 6–23)
CHLORIDE: 102 meq/L (ref 96–112)
CO2: 24 mEq/L (ref 19–32)
Calcium: 9.3 mg/dL (ref 8.4–10.5)
Creatinine, Ser: 0.63 mg/dL (ref 0.50–1.35)
GFR calc Af Amer: >90 mL/min (ref >90)
GFR calc non Af Amer: >90 mL/min (ref >90)
Glucose, Bld: 95 mg/dL (ref 70–99)
POTASSIUM: 4 meq/L (ref 3.7–5.3)
Sodium: 141 mEq/L (ref 137–147)
Total Bilirubin: 0.3 mg/dL (ref 0.3–1.2)
Total Protein: 8.1 g/dL (ref 6.0–8.3)

## 2013-10-04 LAB — POCT I-STAT TROPONIN I: Troponin i, poc: 0 ng/mL (ref 0.00–0.08)

## 2013-10-04 LAB — LIPASE, BLOOD: Lipase: 42 U/L (ref 11–59)

## 2013-10-04 LAB — ETHANOL: Alcohol, Ethyl (B): 121 mg/dL — ABNORMAL HIGH (ref 0–11)

## 2013-10-04 MED ORDER — SODIUM CHLORIDE 0.9 % IV BOLUS (SEPSIS)
1000.0000 mL | Freq: Once | INTRAVENOUS | Status: AC
  Administered 2013-10-04: 1000 mL via INTRAVENOUS

## 2013-10-04 MED ORDER — ONDANSETRON HCL 4 MG/2ML IJ SOLN
4.0000 mg | Freq: Once | INTRAMUSCULAR | Status: AC
  Administered 2013-10-04: 4 mg via INTRAVENOUS
  Filled 2013-10-04: qty 2

## 2013-10-04 MED ORDER — OXYCODONE-ACETAMINOPHEN 5-325 MG PO TABS
2.0000 | ORAL_TABLET | Freq: Once | ORAL | Status: DC
  Filled 2013-10-04: qty 2

## 2013-10-04 NOTE — ED Notes (Addendum)
PT refused medication with tylenol because He has pancreatitis.
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
## 2013-10-04 NOTE — Discharge Instructions (Signed)

## 2013-10-04 NOTE — ED Notes (Signed)
Spoke with [place], PA, states pt is ready to go home, no longer needs to be monitored

## 2013-10-04 NOTE — ED Notes (Signed)
Spoke with Nira Conn PA, was told pt has too many prescriptions from pain medicine, unable to prescribe pt any more pain medicine. Pt continuing to state that doctor said he could have pain medication. Pt very angry and upset, states he lost all his prescriptions. Pt informed he cannot have anything for pain at this time.

## 2013-10-06 NOTE — ED Provider Notes (Signed)
Medical screening examination/treatment/procedure(s) were performed by non-physician practitioner and as supervising physician I was immediately available for consultation/collaboration.  EKG Interpretation    Date/Time:  Saturday October 04 2013 16:30:15 EST Ventricular Rate:  103 PR Interval:  167 QRS Duration: 85 QT Interval:  358 QTC Calculation: 469 R Axis:   95 Text Interpretation:  Sinus tachycardia Atrial premature complex Borderline right axis deviation Probable anteroseptal infarct, old ED PHYSICIAN INTERPRETATION AVAILABLE IN [facility] [place] Confirmed by TEST, RECORD ([id number]) on 10/06/2013 7:40:06 AM              Osvaldo Shipper, MD 10/06/13 1732

## 2013-10-08 ENCOUNTER — Encounter (HOSPITAL_COMMUNITY): Payer: Self-pay | Admitting: Emergency Medicine

## 2013-10-08 ENCOUNTER — Emergency Department (HOSPITAL_COMMUNITY)
Admission: EM | Admit: 2013-10-08 | Discharge: 2013-10-09 | Disposition: A | Discharge status: Home / Self Care | Payer: No Typology Code available for payment source | Attending: Emergency Medicine | Admitting: Emergency Medicine

## 2013-10-08 DIAGNOSIS — K859 Acute pancreatitis without necrosis or infection, unspecified: Secondary | ICD-10-CM | POA: Insufficient documentation

## 2013-10-08 DIAGNOSIS — B1920 Unspecified viral hepatitis C without hepatic coma: Secondary | ICD-10-CM | POA: Insufficient documentation

## 2013-10-08 DIAGNOSIS — M1710 Unilateral primary osteoarthritis, unspecified knee: Secondary | ICD-10-CM | POA: Insufficient documentation

## 2013-10-08 DIAGNOSIS — F10929 Alcohol use, unspecified with intoxication, unspecified: Secondary | ICD-10-CM

## 2013-10-08 DIAGNOSIS — R45851 Suicidal ideations: Secondary | ICD-10-CM | POA: Insufficient documentation

## 2013-10-08 DIAGNOSIS — F10939 Alcohol use, unspecified with withdrawal, unspecified: Secondary | ICD-10-CM | POA: Diagnosis present

## 2013-10-08 DIAGNOSIS — M19019 Primary osteoarthritis, unspecified shoulder: Secondary | ICD-10-CM | POA: Insufficient documentation

## 2013-10-08 DIAGNOSIS — Z8781 Personal history of (healed) traumatic fracture: Secondary | ICD-10-CM | POA: Insufficient documentation

## 2013-10-08 DIAGNOSIS — IMO0001 Reserved for inherently not codable concepts without codable children: Secondary | ICD-10-CM | POA: Insufficient documentation

## 2013-10-08 DIAGNOSIS — F1010 Alcohol abuse, uncomplicated: Secondary | ICD-10-CM | POA: Insufficient documentation

## 2013-10-08 DIAGNOSIS — F10239 Alcohol dependence with withdrawal, unspecified: Secondary | ICD-10-CM | POA: Diagnosis present

## 2013-10-08 DIAGNOSIS — Z87448 Personal history of other diseases of urinary system: Secondary | ICD-10-CM | POA: Insufficient documentation

## 2013-10-08 DIAGNOSIS — F17200 Nicotine dependence, unspecified, uncomplicated: Secondary | ICD-10-CM | POA: Insufficient documentation

## 2013-10-08 DIAGNOSIS — IMO0002 Reserved for concepts with insufficient information to code with codable children: Secondary | ICD-10-CM

## 2013-10-08 DIAGNOSIS — F339 Major depressive disorder, recurrent, unspecified: Secondary | ICD-10-CM | POA: Diagnosis present

## 2013-10-08 DIAGNOSIS — K219 Gastro-esophageal reflux disease without esophagitis: Secondary | ICD-10-CM | POA: Insufficient documentation

## 2013-10-08 DIAGNOSIS — Z862 Personal history of diseases of the blood and blood-forming organs and certain disorders involving the immune mechanism: Secondary | ICD-10-CM | POA: Insufficient documentation

## 2013-10-08 DIAGNOSIS — Z8639 Personal history of other endocrine, nutritional and metabolic disease: Secondary | ICD-10-CM | POA: Insufficient documentation

## 2013-10-08 DIAGNOSIS — G8929 Other chronic pain: Secondary | ICD-10-CM | POA: Insufficient documentation

## 2013-10-08 DIAGNOSIS — Z8709 Personal history of other diseases of the respiratory system: Secondary | ICD-10-CM | POA: Insufficient documentation

## 2013-10-08 DIAGNOSIS — F1020 Alcohol dependence, uncomplicated: Secondary | ICD-10-CM | POA: Diagnosis present

## 2013-10-08 DIAGNOSIS — Z79899 Other long term (current) drug therapy: Secondary | ICD-10-CM | POA: Insufficient documentation

## 2013-10-08 DIAGNOSIS — Z7982 Long term (current) use of aspirin: Secondary | ICD-10-CM | POA: Insufficient documentation

## 2013-10-08 DIAGNOSIS — Z8701 Personal history of pneumonia (recurrent): Secondary | ICD-10-CM | POA: Insufficient documentation

## 2013-10-08 LAB — COMPREHENSIVE METABOLIC PANEL
ALT: 142 U/L — ABNORMAL HIGH (ref 0–53)
AST: 107 U/L — AB (ref 0–37)
Albumin: 3.7 g/dL (ref 3.5–5.2)
Alkaline Phosphatase: 99 U/L (ref 39–117)
BUN: 10 mg/dL (ref 6–23)
CHLORIDE: 100 meq/L (ref 96–112)
CO2: 25 meq/L (ref 19–32)
Calcium: 8.9 mg/dL (ref 8.4–10.5)
Creatinine, Ser: 0.71 mg/dL (ref 0.50–1.35)
GFR calc Af Amer: >90 mL/min (ref >90)
Glucose, Bld: 101 mg/dL — ABNORMAL HIGH (ref 70–99)
Potassium: 5 mEq/L (ref 3.7–5.3)
Sodium: 140 mEq/L (ref 137–147)
Total Protein: 8.2 g/dL (ref 6.0–8.3)

## 2013-10-08 LAB — CBC
HCT: 43.6 % (ref 39.0–52.0)
HEMOGLOBIN: 15.5 g/dL (ref 13.0–17.0)
MCH: 34.1 pg — ABNORMAL HIGH (ref 26.0–34.0)
MCHC: 35.6 g/dL (ref 30.0–36.0)
MCV: 96 fL (ref 78.0–100.0)
PLATELETS: 295 10*3/uL (ref 150–400)
RBC: 4.54 MIL/uL (ref 4.22–5.81)
RDW: 13 % (ref 11.5–15.5)
WBC: 11.6 10*3/uL — AB (ref 4.0–10.5)

## 2013-10-08 LAB — RAPID URINE DRUG SCREEN, HOSP PERFORMED
Amphetamines: NOT DETECTED
BARBITURATES: NOT DETECTED
BENZODIAZEPINES: NOT DETECTED
Cocaine: NOT DETECTED
Opiates: NOT DETECTED
Tetrahydrocannabinol: NOT DETECTED

## 2013-10-08 LAB — SALICYLATE LEVEL: Salicylate Lvl: <2 mg/dL — ABNORMAL LOW (ref 2.8–20.0)

## 2013-10-08 LAB — LIPASE, BLOOD: LIPASE: 96 U/L — AB (ref 11–59)

## 2013-10-08 LAB — ACETAMINOPHEN LEVEL

## 2013-10-08 LAB — ETHANOL: Alcohol, Ethyl (B): 266 mg/dL — ABNORMAL HIGH (ref 0–11)

## 2013-10-08 MED ORDER — ONDANSETRON 4 MG PO TBDP
4.0000 mg | ORAL_TABLET | Freq: Once | ORAL | Status: AC
Start: 2013-10-08 — End: 2013-10-08
  Administered 2013-10-08: 4 mg via ORAL
  Filled 2013-10-08: qty 1

## 2013-10-08 MED ORDER — LORAZEPAM 1 MG PO TABS
0.0000 mg | ORAL_TABLET | Freq: Four times a day (QID) | ORAL | Status: DC
  Administered 2013-10-09 (×4): 1 mg via ORAL
  Filled 2013-10-08: qty 1
  Filled 2013-10-08: qty 2
  Filled 2013-10-08 (×3): qty 1

## 2013-10-08 MED ORDER — NICOTINE 21 MG/24HR TD PT24
21.0000 mg | MEDICATED_PATCH | Freq: Every day | TRANSDERMAL | Status: DC
  Administered 2013-10-09 (×2): 21 mg via TRANSDERMAL
  Filled 2013-10-08 (×2): qty 1

## 2013-10-08 MED ORDER — ONDANSETRON 4 MG PO TBDP
4.0000 mg | ORAL_TABLET | Freq: Once | ORAL | Status: DC
  Filled 2013-10-08: qty 1

## 2013-10-08 MED ORDER — LORAZEPAM 1 MG PO TABS
0.0000 mg | ORAL_TABLET | Freq: Two times a day (BID) | ORAL | Status: DC
  Administered 2013-10-08: 1 mg via ORAL

## 2013-10-08 MED ORDER — IBUPROFEN 200 MG PO TABS
400.0000 mg | ORAL_TABLET | Freq: Once | ORAL | Status: DC
  Filled 2013-10-08: qty 2

## 2013-10-08 MED ORDER — ACETAMINOPHEN 325 MG PO TABS
650.0000 mg | ORAL_TABLET | ORAL | Status: DC | PRN
Start: 2013-10-08 — End: 2013-10-08
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
## 2013-10-08 NOTE — ED Notes (Signed)
Pt is extremely agitated. He refused to speak with the counselor, and hung up the phone. Phineas Douglas, PA is aware and plans to IVC the pt. Visitor at bedside.

## 2013-10-08 NOTE — ED Notes (Signed)
Pt is upset about not receiving his desired pain medication, so he spit out his zofran and is again threatening to leave.

## 2013-10-08 NOTE — ED Notes (Signed)
Pt continuing to call out for pain medication and nausea medication.

## 2013-10-08 NOTE — ED Notes (Signed)
Magistrate's office called to report that the Affidavit was filled out incorrectly. This RN had Rogene Houston, MD resign the paperwork, and called [facility] to notarize the paper.

## 2013-10-08 NOTE — ED Notes (Signed)
[place], [facility] notified that the pt is asking for pain nausea medication.
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
## 2013-10-08 NOTE — BH Assessment (Signed)
[facility] Assessment Progress Note  At 21:18 I spoke to EDP Dr Stevie Kern in anticipation of TTS assessment scheduled for 21:30.  Jalene Mullet, MA Triage Specialist 10/08/2013 @ 21:22

## 2013-10-08 NOTE — ED Notes (Signed)
Spoke with the Magistrate's office. IVC paper successfully faxed.
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
## 2013-10-08 NOTE — ED Notes (Signed)
Pt states that if he does not get pain medication, he is going to leave. Pt is demanding his clothes. Visitor at bedside is trying to reason with the pt. According to [place], [place] pt is to be IVC'd.

## 2013-10-08 NOTE — ED Notes (Signed)
Sitter with patient.  

## 2013-10-08 NOTE — ED Notes (Signed)
Telepsych monitor at bedside, however, volume is not working. Pt is speaking with the counselor on the phone while being seen on the monitor.

## 2013-10-09 ENCOUNTER — Encounter (HOSPITAL_COMMUNITY): Payer: Self-pay | Admitting: Psychiatry

## 2013-10-09 ENCOUNTER — Encounter (HOSPITAL_COMMUNITY): Payer: Self-pay

## 2013-10-09 ENCOUNTER — Inpatient Hospital Stay (HOSPITAL_COMMUNITY)
Admission: AD | Admit: 2013-10-09 | Discharge: 2013-10-13 | DRG: 897 | Disposition: A | Discharge status: Home / Self Care | Payer: No Typology Code available for payment source | Source: Intra-hospital | Attending: Psychiatry | Admitting: Psychiatry

## 2013-10-09 DIAGNOSIS — E0590 Thyrotoxicosis, unspecified without thyrotoxic crisis or storm: Secondary | ICD-10-CM | POA: Diagnosis present

## 2013-10-09 DIAGNOSIS — F332 Major depressive disorder, recurrent severe without psychotic features: Secondary | ICD-10-CM

## 2013-10-09 DIAGNOSIS — F10939 Alcohol use, unspecified with withdrawal, unspecified: Principal | ICD-10-CM

## 2013-10-09 DIAGNOSIS — Z5900 Homelessness unspecified: Secondary | ICD-10-CM

## 2013-10-09 DIAGNOSIS — F1910 Other psychoactive substance abuse, uncomplicated: Secondary | ICD-10-CM

## 2013-10-09 DIAGNOSIS — M129 Arthropathy, unspecified: Secondary | ICD-10-CM | POA: Diagnosis present

## 2013-10-09 DIAGNOSIS — Z8249 Family history of ischemic heart disease and other diseases of the circulatory system: Secondary | ICD-10-CM

## 2013-10-09 DIAGNOSIS — F17200 Nicotine dependence, unspecified, uncomplicated: Secondary | ICD-10-CM | POA: Diagnosis present

## 2013-10-09 DIAGNOSIS — M545 Low back pain, unspecified: Secondary | ICD-10-CM | POA: Diagnosis present

## 2013-10-09 DIAGNOSIS — F1010 Alcohol abuse, uncomplicated: Secondary | ICD-10-CM

## 2013-10-09 DIAGNOSIS — G8929 Other chronic pain: Secondary | ICD-10-CM | POA: Diagnosis present

## 2013-10-09 DIAGNOSIS — F1020 Alcohol dependence, uncomplicated: Secondary | ICD-10-CM

## 2013-10-09 DIAGNOSIS — R45851 Suicidal ideations: Secondary | ICD-10-CM

## 2013-10-09 DIAGNOSIS — F339 Major depressive disorder, recurrent, unspecified: Secondary | ICD-10-CM

## 2013-10-09 DIAGNOSIS — Z808 Family history of malignant neoplasm of other organs or systems: Secondary | ICD-10-CM

## 2013-10-09 DIAGNOSIS — K219 Gastro-esophageal reflux disease without esophagitis: Secondary | ICD-10-CM | POA: Diagnosis present

## 2013-10-09 DIAGNOSIS — F411 Generalized anxiety disorder: Secondary | ICD-10-CM | POA: Diagnosis present

## 2013-10-09 DIAGNOSIS — F1994 Other psychoactive substance use, unspecified with psychoactive substance-induced mood disorder: Secondary | ICD-10-CM

## 2013-10-09 DIAGNOSIS — F10239 Alcohol dependence with withdrawal, unspecified: Principal | ICD-10-CM

## 2013-10-09 LAB — URINALYSIS, ROUTINE W REFLEX MICROSCOPIC
Bilirubin Urine: NEGATIVE
Glucose, UA: NEGATIVE mg/dL
Hgb urine dipstick: NEGATIVE
Ketones, ur: NEGATIVE mg/dL
LEUKOCYTES UA: NEGATIVE
NITRITE: NEGATIVE
PH: 6 (ref 5.0–8.0)
Protein, ur: NEGATIVE mg/dL
Specific Gravity, Urine: 1.012 (ref 1.005–1.030)
UROBILINOGEN UA: 0.2 mg/dL (ref 0.0–1.0)

## 2013-10-09 LAB — PROTIME-INR
INR: 0.77 (ref 0.00–1.49)
Prothrombin Time: 10.7 seconds — ABNORMAL LOW (ref 11.6–15.2)

## 2013-10-09 MED ORDER — OXYCODONE HCL 5 MG PO TABS
10.0000 mg | ORAL_TABLET | ORAL | Status: DC | PRN
  Administered 2013-10-09 – 2013-10-13 (×18): 10 mg via ORAL
  Filled 2013-10-09 (×18): qty 2

## 2013-10-09 MED ORDER — VITAMIN B-1 100 MG PO TABS
100.0000 mg | ORAL_TABLET | Freq: Every day | ORAL | Status: DC
  Administered 2013-10-09: 10:00:00 100 mg via ORAL
  Filled 2013-10-09: qty 1

## 2013-10-09 MED ORDER — LORAZEPAM 2 MG/ML IJ SOLN: 1.0000 mg | Freq: Four times a day (QID) | INTRAMUSCULAR | Status: DC | PRN

## 2013-10-09 MED ORDER — SODIUM CHLORIDE 0.9 % IV BOLUS (SEPSIS)
1000.0000 mL | Freq: Once | INTRAVENOUS | Status: AC
  Administered 2013-10-09: 1000 mL via INTRAVENOUS

## 2013-10-09 MED ORDER — ZOLPIDEM TARTRATE 5 MG PO TABS
5.0000 mg | ORAL_TABLET | Freq: Every evening | ORAL | Status: DC | PRN
  Administered 2013-10-09 – 2013-10-10 (×2): 5 mg via ORAL
  Filled 2013-10-09 (×2): qty 1

## 2013-10-09 MED ORDER — LORAZEPAM 1 MG PO TABS
0.0000 mg | ORAL_TABLET | Freq: Two times a day (BID) | ORAL | Status: DC
  Administered 2013-10-12: 1 mg via ORAL
  Filled 2013-10-09: qty 1

## 2013-10-09 MED ORDER — ALUM & MAG HYDROXIDE-SIMETH 200-200-20 MG/5ML PO SUSP
30.0000 mL | ORAL | Status: DC | PRN
Start: 2013-10-09 — End: 2013-10-13

## 2013-10-09 MED ORDER — THIAMINE HCL 100 MG/ML IJ SOLN: 100.0000 mg | Freq: Every day | INTRAMUSCULAR | Status: DC

## 2013-10-09 MED ORDER — MAGNESIUM HYDROXIDE 400 MG/5ML PO SUSP
30.0000 mL | Freq: Every day | ORAL | Status: DC | PRN
Start: 2013-10-09 — End: 2013-10-13

## 2013-10-09 MED ORDER — HYDROMORPHONE HCL PF 1 MG/ML IJ SOLN
0.5000 mg | Freq: Once | INTRAMUSCULAR | Status: AC
  Administered 2013-10-09: 0.5 mg via INTRAVENOUS
  Filled 2013-10-09: qty 1

## 2013-10-09 MED ORDER — PROMETHAZINE HCL 25 MG PO TABS
12.5000 mg | ORAL_TABLET | Freq: Four times a day (QID) | ORAL | Status: DC | PRN
  Administered 2013-10-12: 12.5 mg via ORAL
  Filled 2013-10-09: qty 1

## 2013-10-09 MED ORDER — ADULT MULTIVITAMIN W/MINERALS CH
1.0000 | ORAL_TABLET | Freq: Every day | ORAL | Status: DC
  Administered 2013-10-10 – 2013-10-13 (×4): 1 via ORAL
  Filled 2013-10-09 (×8): qty 1

## 2013-10-09 MED ORDER — LORAZEPAM 1 MG PO TABS
0.0000 mg | ORAL_TABLET | Freq: Four times a day (QID) | ORAL | Status: AC
  Administered 2013-10-09 – 2013-10-11 (×6): 1 mg via ORAL
  Filled 2013-10-09 (×6): qty 1

## 2013-10-09 MED ORDER — VITAMIN B-1 100 MG PO TABS
100.0000 mg | ORAL_TABLET | Freq: Every day | ORAL | Status: DC
  Administered 2013-10-10 – 2013-10-13 (×3): 100 mg via ORAL
  Filled 2013-10-09 (×6): qty 1

## 2013-10-09 MED ORDER — ASPIRIN EC 81 MG PO TBEC
81.0000 mg | DELAYED_RELEASE_TABLET | Freq: Every day | ORAL | Status: DC
  Administered 2013-10-10 – 2013-10-13 (×4): 81 mg via ORAL
  Filled 2013-10-09 (×6): qty 1

## 2013-10-09 MED ORDER — ONDANSETRON HCL 4 MG/2ML IJ SOLN: 4.0000 mg | Freq: Four times a day (QID) | INTRAMUSCULAR | Status: DC | PRN

## 2013-10-09 MED ORDER — FOLIC ACID 1 MG PO TABS
1.0000 mg | ORAL_TABLET | Freq: Every day | ORAL | Status: DC
  Administered 2013-10-10 – 2013-10-13 (×4): 1 mg via ORAL
  Filled 2013-10-09 (×7): qty 1

## 2013-10-09 MED ORDER — LORAZEPAM 1 MG PO TABS
1.0000 mg | ORAL_TABLET | Freq: Three times a day (TID) | ORAL | Status: DC | PRN
  Administered 2013-10-09: 1 mg via ORAL
  Filled 2013-10-09: qty 1

## 2013-10-09 MED ORDER — HYDROMORPHONE HCL PF 1 MG/ML IJ SOLN
0.5000 mg | INTRAMUSCULAR | Status: DC | PRN
  Administered 2013-10-09 (×3): 0.5 mg via INTRAVENOUS
  Filled 2013-10-09 (×3): qty 1

## 2013-10-09 MED ORDER — LORAZEPAM 1 MG PO TABS
1.0000 mg | ORAL_TABLET | Freq: Four times a day (QID) | ORAL | Status: DC | PRN
  Administered 2013-10-11 – 2013-10-12 (×3): 1 mg via ORAL
  Filled 2013-10-09 (×2): qty 1

## 2013-10-09 MED ORDER — PANTOPRAZOLE SODIUM 40 MG PO TBEC
40.0000 mg | DELAYED_RELEASE_TABLET | Freq: Every day | ORAL | Status: DC
  Administered 2013-10-10 – 2013-10-13 (×4): 40 mg via ORAL
  Filled 2013-10-09 (×6): qty 1

## 2013-10-09 MED ORDER — LORAZEPAM 1 MG PO TABS: 1.0000 mg | ORAL_TABLET | Freq: Three times a day (TID) | ORAL | Status: DC | PRN

## 2013-10-09 MED ORDER — ZOLPIDEM TARTRATE 5 MG PO TABS
5.0000 mg | ORAL_TABLET | Freq: Every evening | ORAL | Status: DC | PRN
  Administered 2013-10-09: 5 mg via ORAL
  Filled 2013-10-09: qty 1

## 2013-10-09 NOTE — Tx Team (Signed)
Initial Interdisciplinary Treatment Plan  PATIENT STRENGTHS: (choose at least two) Active sense of humor Communication skills Supportive family/friends  PATIENT STRESSORS: Financial difficulties Substance abuse   PROBLEM LIST: Problem List/Patient Goals Date to be addressed Date deferred Reason deferred Estimated date of resolution  ETOH abuse long term treatment      Depression with SI      Chronic pain                                           DISCHARGE CRITERIA:  Adequate post-discharge living arrangements Improved stabilization in mood, thinking, and/or behavior Motivation to continue treatment in a less acute level of care Verbal commitment to aftercare and medication compliance  PRELIMINARY DISCHARGE PLAN: Attend 12-step recovery group Outpatient therapy Placement in alternative living arrangements  PATIENT/FAMIILY INVOLVEMENT: This treatment plan has been presented to and reviewed with the patient, [name], and/or family member, .  The patient and family have been given the opportunity to ask questions and make suggestions.  [name] 10/09/2013, 8:51 PM

## 2013-10-09 NOTE — ED Notes (Signed)
Pt requested bacitracin for a wound on his hand. This RN placed bacitracin and gauze to the pt's left hand.

## 2013-10-09 NOTE — BH Assessment (Signed)
Heloise Purpura, NP will meet with patient 12noon to complete a tele assessment on this patient.
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
## 2013-10-09 NOTE — ED Notes (Signed)
Spoke with the Magistrate's office. Affidavit has been received and is filled out correctly.  Pt continues to complain about the lack of pain medication, and is still threatening to leave.

## 2013-10-09 NOTE — ED Notes (Signed)
Pt is going to take a shower. This RN wrapped the pt's IV with plastic to keep it dry.
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
## 2013-10-09 NOTE — ED Notes (Signed)
Affidavit re-faxed.

## 2013-10-09 NOTE — [facility] Counselor (Addendum)
Heloise Purpura NP attempted to conduct TP with pt and Probation officer assisted. However, the calls to [facility] telassessment cart wouldn't go through. Several minutes later, Anne Ng RN was able to call into the TA machine but Heloise Purpura was w/ a pt. Heloise Purpura reports he will attempted TP later today.   Arnold Long, [place] Assessment Counselor
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
## 2013-10-09 NOTE — ED Notes (Signed)
[employer] HAS LEFT WITH THE PT

## 2013-10-09 NOTE — Treatment Plan (Signed)
Pt accepted to [facility] by Waylan Boga, NP to room 502-1.
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
## 2013-10-09 NOTE — ED Notes (Signed)
Pt asking for something to help him sleep. This RN attempting to Environmental education officer, [place].

## 2013-10-09 NOTE — Progress Notes (Signed)
Adult Psychoeducational Group Note  Date:  10/09/2013 Time:  08:00pm Group Topic/Focus:  Wrap-Up Group:   The focus of this group is to help patients review their daily goal of treatment and discuss progress on daily workbooks.  Participation Level:  Active  Participation Quality:  Appropriate and Attentive  Affect:  Appropriate  Cognitive:  Alert and Appropriate  Insight: Appropriate  Engagement in Group:  Engaged  Modes of Intervention:  Discussion and Education  Additional Comments:  Pt attended and participated in group.     Marlowe Shores D 10/09/2013, 8:48 PM

## 2013-10-09 NOTE — ED Notes (Signed)
Schinlever, PA to put in orders for sleep medications.

## 2013-10-09 NOTE — ED Notes (Signed)
[place]

## 2013-10-10 ENCOUNTER — Encounter (HOSPITAL_COMMUNITY): Payer: Self-pay | Admitting: Psychiatry

## 2013-10-10 DIAGNOSIS — F10239 Alcohol dependence with withdrawal, unspecified: Principal | ICD-10-CM

## 2013-10-10 DIAGNOSIS — F339 Major depressive disorder, recurrent, unspecified: Secondary | ICD-10-CM

## 2013-10-10 DIAGNOSIS — F1020 Alcohol dependence, uncomplicated: Secondary | ICD-10-CM

## 2013-10-10 DIAGNOSIS — F10939 Alcohol use, unspecified with withdrawal, unspecified: Principal | ICD-10-CM

## 2013-10-10 MED ORDER — BOOST / RESOURCE BREEZE PO LIQD
1.0000 | Freq: Three times a day (TID) | ORAL | Status: DC
  Administered 2013-10-10 – 2013-10-13 (×5): 1 via ORAL
  Filled 2013-10-10 (×14): qty 1

## 2013-10-10 MED ORDER — NICOTINE 21 MG/24HR TD PT24
21.0000 mg | MEDICATED_PATCH | Freq: Every day | TRANSDERMAL | Status: DC
  Administered 2013-10-10 – 2013-10-13 (×4): 21 mg via TRANSDERMAL
  Filled 2013-10-10 (×2): qty 1
  Filled 2013-10-10: qty 14
  Filled 2013-10-10 (×3): qty 1

## 2013-10-10 MED ORDER — BACITRACIN-NEOMYCIN-POLYMYXIN OINTMENT TUBE
TOPICAL_OINTMENT | CUTANEOUS | Status: DC | PRN
  Administered 2013-10-13: 15 via TOPICAL
  Filled 2013-10-10: qty 1
  Filled 2013-10-10: qty 15

## 2013-10-10 NOTE — Tx Team (Signed)
Interdisciplinary Treatment Plan Update (Adult)  Date: 10/10/2013  Time Reviewed:  9:45 AM  Progress in Treatment: Attending groups: Yes Participating in groups:  Yes Taking medication as prescribed:  Yes Tolerating medication:  Yes Family/Significant othe contact made: CSW assessing  Patient understands diagnosis:  Yes Discussing patient identified problems/goals with staff:  Yes Medical problems stabilized or resolved:  Yes Denies suicidal/homicidal ideation: Yes Issues/concerns per patient self-inventory:  Yes Other:  New problem(s) identified: N/A  Discharge Plan or Barriers: CSW assessing for appropriate referrals.  Reason for Continuation of Hospitalization: Anxiety Depression Medication Stabilization  Comments: N/A  Estimated length of stay: 3-5 days  For review of initial/current patient goals, please see plan of care.  Attendees: Patient:     Family:     Physician:  Dr. Johnalagadda 10/10/2013 10:41 AM   Nursing:   Ronecia Byrd, RN 10/10/2013 10:41 AM   Clinical Social Worker:  Abeera Flannery Horton, LCSW 10/10/2013 10:41 AM   Other: Britney Tyson, RN 10/10/2013 10:41 AM   Other:  Valerie Noch, care coordination 10/10/2013 10:41 AM   Other:  Quylle Hodnett, LCSW 10/10/2013 10:41 AM   Other:     Other:    Other:    Other:    Other:    Other:    Other:     Scribe for Treatment Team:   Horton, Nehemie Casserly Nicole, 10/10/2013 10:41 AM    

## 2013-10-10 NOTE — BHH Counselor (Signed)
Adult Psychosocial Assessment Update Interdisciplinary Team  Previous [facility] admissions/discharges:  Admissions Discharges  Date: 06/20/13 Date: 06/23/13  Date: Date:  Date: Date:  Date: Date:  Date: Date:   Changes since the last Psychosocial Assessment (including adherence to outpatient mental health and/or substance abuse treatment, situational issues contributing to decompensation and/or relapse). Pt reports coming here in September for detox off of alcohol as well as this time.  Pt states that he is homeless in [place], staying in a tent.  Pt states that he'd rather stay in his tent than a shelter as he can come and go as he wants.  Pt states that he came to detox and refused referrals for outpatient mental health.  Pt states that he has tried [name] twice before and didn't like what they had to offer.               Discharge Plan 1. Will you be returning to the same living situation after discharge?   Yes: X Pt is homeless in [place] and states that a ministry is helping him get into [address] housing.  Pt refused information provided about shelters as pt states he'd rather live in his tent. No:      If no, what is your plan?           2. Would you like a referral for services when you are discharged? Yes:    If yes, for what services?   No:   X  Pt doesn't want any referrals for outpatient medication management or therapy.  CSW informed pt about [facility] and [employer] of the [place].             Summary and Recommendations (to be completed by the evaluator) Patient is a 58 year old Caucasian Male with a diagnosis of Alcohol Dependence and Depressive Disorder NOS.  Patient is homeless in [place].  Patient will benefit from crisis stabilization, medication evaluation, group therapy and psycho education in addition to case management for discharge planning.                         Signature:  [name], 10/10/2013 8:38  AM
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
## 2013-10-10 NOTE — [facility] Group Notes (Signed)
[facility] LCSW Group Therapy  10/10/2013  1:15 PM   Type of Therapy:  Group Therapy  Participation Level:  Active  Participation Quality:  Attentive, Sharing and Supportive  Affect:  Depressed and Flat  Cognitive:  Alert and Oriented  Insight:  Developing/Improving and Engaged  Engagement in Therapy:  Developing/Improving and Engaged  Modes of Intervention:  Clarification, Confrontation, Discussion, Education, Exploration, Limit-setting, Orientation, Problem-solving, Rapport Building, Art therapist, Socialization and Support  Summary of Progress/Problems: The topic for today was feelings about relapse.  Pt discussed what relapse prevention is to them and identified triggers that they are on the path to relapse.  Pt processed their feeling towards relapse and was able to relate to peers.  Pt discussed coping skills that can be used for relapse prevention.   Pt shared that he has had many relapses over the years but is tired of hurting his kids and wants to get healthy, back to work and a home but knows he can't accomplish these goals until he stops drinking.  Pt appeared motivated to stop drinking and participated in discussion of how alcohol has impacted their health.  Pt actively participated and was engaged in group discussion.    Regan Lemming, LCSW 10/10/2013 2:55 PM

## 2013-10-10 NOTE — Progress Notes (Signed)
Patient ID: OSA CAMPOLI, male   DOB: March 07, 1956, 58 y.o.   MRN: [id number] D: Pt. In bed eyes closed, respirations even. A: Writer assessed for s/s of distress. Staff will monitor q75min for safety. R: Pt. Is safe on the unit, no distress noted respirations unlabored.

## 2013-10-10 NOTE — [facility] Group Notes (Signed)
[facility] LCSW Aftercare Discharge Planning Group Note   10/10/2013 8:45 AM  Participation Quality:  Alert, Appropriate and Oriented  Mood/Affect:  Flat and Depressed  Depression Rating:  5  Anxiety Rating:  8  Thoughts of Suicide:  Pt denies SI/HI  Will you contract for safety?   Yes  Current AVH:  Pt denies  Plan for Discharge/Comments:  Pt attended discharge planning group and actively participated in group.  CSW provided pt with today's workbook.  Pt reports coming to the hospital for pain and alcohol detox.  Pt states that he is homeless in [place] but reports a ministry is working on getting him [employer] 8 housing.  CSW will assess for appropriate referrals.  No further needs voiced by pt at this time.    Transportation Means: Pt reports access to transportation  Supports: No supports mentioned at this time  Regan Lemming, LCSW 10/10/2013 9:32 AM

## 2013-10-10 NOTE — Progress Notes (Signed)
[name] Group Notes:  (Nursing/MHT/Case Management/Adjunct)  Date:  10/10/2013  Time:  10:15 PM  Type of Therapy:  Group Therapy  Participation Level:  Active  Participation Quality:  Appropriate  Affect:  Appropriate  Cognitive:  Appropriate  Insight:  Good  Engagement in Group:  Engaged  Modes of Intervention:  Socialization and Support  Summary of Progress/Problems: Pt. Stated he was feeling better and was preparing mentally for a surgical procedure.  Pt. Stated his environment makes it easy for him to relapse.  Pt. Stated a good support group would help with relapse prevention.  [name] 10/10/2013, 10:15 PM

## 2013-10-10 NOTE — Progress Notes (Signed)
D: Patient denies SI/HI and A/V hallucinations; patient reports sleep is fair; reports appetite is improving ; reports energy level is low ; reports ability to pay attention is improving; rates depression as 5/10; rates hopelessness 1/10; rates anxiety as 8/10;patient reports pain; patient also reporting tremors, agitation, and chills due to withdrawal; patient reports that he is homeless;   A: Monitored q 15 minutes; patient encouraged to attend groups; patient educated about medications; patient given medications per physician orders; patient encouraged to express feelings and/or concerns  R: Patient does have tremors and agitation; patient reports some relief of pain but he is asking for pain medicine before it due; patient reports anxiety but able to concentrate and sit still; patient appears nice and pleasant; patient is cooperative and appropriate to circumstances;  patient's interaction with staff and peers is appropriate; patient was able to set goal to talk with staff 1:1 when having feelings of SI; patient is taking medications as prescribed and tolerating medications; patient is attending all groups

## 2013-10-10 NOTE — [facility] Suicide Risk Assessment (Signed)
Suicide Risk Assessment  Admission Assessment     Nursing information obtained from:    Demographic factors:    Current Mental Status:    Loss Factors:    Historical Factors:    Risk Reduction Factors:     CLINICAL FACTORS:   Severe Anxiety and/or Agitation Depression:   Anhedonia Comorbid alcohol abuse/dependence Hopelessness Impulsivity Insomnia Recent sense of peace/wellbeing Severe Alcohol/Substance Abuse/Dependencies Chronic Pain Unstable or Poor Therapeutic Relationship Previous Psychiatric Diagnoses and Treatments Medical Diagnoses and Treatments/Surgeries  COGNITIVE FEATURES THAT CONTRIBUTE TO RISK:  Closed-mindedness Loss of executive function Polarized thinking    SUICIDE RISK:   Moderate:  Frequent suicidal ideation with limited intensity, and duration, some specificity in terms of plans, no associated intent, good self-control, limited dysphoria/symptomatology, some risk factors present, and identifiable protective factors, including available and accessible social support.  PLAN OF CARE: Admit for crisis stabilization, safety monitoring and medication management for substance induced mood disorder and depression with suicidal ideation. Patient will receive alcohol detox treatment and appropriate medication for depression.  I certify that inpatient services furnished can reasonably be expected to improve the patient's condition.  Daruis Swaim,JANARDHAHA R. 10/10/2013, 11:32 AM

## 2013-10-10 NOTE — Progress Notes (Signed)
Recreation Therapy Notes  Date: 01.09.2015 Time: 2:45pm Location: [address] Dayroom   Group Topic: Stress Management  Goal Area(s) Addresses:  Patient will verbalize importance of using healthy stress management.  Patient will complete stress management techniques taught in group session.   Behavioral Response: Did not attend.    Laureen Ochs Jemma Rasp, LRT/CTRS  Lane Hacker 10/10/2013 5:17 PM
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
## 2013-10-11 MED ORDER — TRAZODONE HCL 100 MG PO TABS
100.0000 mg | ORAL_TABLET | Freq: Every day | ORAL | Status: DC
  Administered 2013-10-11 – 2013-10-12 (×2): 100 mg via ORAL
  Filled 2013-10-11: qty 14
  Filled 2013-10-11 (×4): qty 1
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
## 2013-10-11 NOTE — Progress Notes (Addendum)
[name] is out in the milieu.Marland KitchenHe  interacts with staff and the other patients appropriately. He is quiet, keeps to himself, but pays attention in his groups.   A He is asked to complete his self inventory but says " I don't want to do that today ,"   R He requests, and is given ativan 1 mg po and oxycodone IR 10 mg  po at 1410, for c/o pain " all over and in my back". Safety is in place and poc moves forward. Pt 's [name] is dc'Miguel per MD and trazadone 100 mg scheduled for hs.

## 2013-10-11 NOTE — [facility] Group Notes (Signed)
[facility] Group Notes:  (Clinical Social Work)  @DATE @    3:00-4:00PM  Summary of Progress/Problems:   The main focus of today's process group was for the patient to identify ways in which they have sabotaged their own mental health wellness/recovery.  Motivational interviewing was used to explore the reasons they engage in this behavior, and reasons they may have for wanting to change.  Scaling of 1 (no motivation) to 10 (complete motivation) was used for the patient to identify where they are with regard to changing the self-defeating behavior discussed by them in group today.  The patient expressed that he self-sabotages with alcohol, but did not stay for the remainder of group.  He is concerned about an upcoming appointment on 10/15/13 to discuss potential surgery.  Type of Therapy:  Process Group  Participation Level:  Active  Participation Quality:  Attentive  Affect:  Appropriate  Cognitive:  Appropriate  Insight:  Limited  Engagement in Therapy:  Limited  Modes of Intervention:  Education, Motivational Interviewing   Selmer Dominion, LCSW 10/11/2013, 4:00pm

## 2013-10-11 NOTE — Progress Notes (Signed)
Psychoeducational Group Note  Date: 10/11/2013 Time: 0930  Group Topic/Focus:  Identifying Needs:   The focus of this group is to help patients identify their personal  Goals as well as the steps needed in ordere to achieve these goals. Participation Level:  Active  Participation Quality:  Appropriate  Affect:  Appropriate  Cognitive:  Alert  Insight:  Engaged  Engagement in Group:  Engaged  Additional Comments:    1/10/20154:42 PM Kaly Mcquary, Trixie Rude

## 2013-10-11 NOTE — Plan of Care (Deleted)
Psychoeducational Group Note  Date:  10/11/2013 Time: [id number]  Group Topic/Focus:  Identifying Needs:   The focus of this group is to help patients identify their personal needs that have been historically problematic and identify healthy behaviors to address their needs.  Participation Level:  Active  Participation Quality:  Appropriate  Affect:  Anxious  Cognitive:  Alert  Insight:  Engaged  Engagement in Group:  Engaged  Additional Comments:    10/11/2013,4:57 PM Destyn Schuyler, Trixie Rude

## 2013-10-12 DIAGNOSIS — F332 Major depressive disorder, recurrent severe without psychotic features: Secondary | ICD-10-CM

## 2013-10-12 MED ORDER — ONDANSETRON HCL 4 MG PO TABS: 4.0000 mg | ORAL_TABLET | Freq: Four times a day (QID) | ORAL | Status: DC | PRN

## 2013-10-12 MED ORDER — ONDANSETRON 4 MG PO TBDP
4.0000 mg | ORAL_TABLET | Freq: Four times a day (QID) | ORAL | Status: DC | PRN
  Administered 2013-10-12: 4 mg via ORAL

## 2013-10-12 MED ORDER — OXYCODONE HCL 5 MG PO TABS
ORAL_TABLET | ORAL | Status: AC
  Filled 2013-10-12: qty 1

## 2013-10-12 MED ORDER — LORAZEPAM 1 MG PO TABS
1.0000 mg | ORAL_TABLET | Freq: Every day | ORAL | Status: AC
  Administered 2013-10-13: 1 mg via ORAL
  Filled 2013-10-12: qty 1

## 2013-10-12 MED ORDER — LORAZEPAM 1 MG PO TABS
1.0000 mg | ORAL_TABLET | Freq: Once | ORAL | Status: AC
  Administered 2013-10-12: 1 mg via ORAL
  Filled 2013-10-12: qty 1

## 2013-10-12 NOTE — Progress Notes (Addendum)
[name] reports getting nauseous in the cafe this morning ( during breakfast) and came back to his room and stayed ll day. HE has not attended his groups, nor has he come to the med window at med passs times.     A He did complete his am self inventory and on it he said his depression and hopelessness were " 7/7" and he denies SI. He received zofran at lunch, for c/o nausea and he iad he got good releif with this.     R  He cont to discuss with staff his many DC options.

## 2013-10-12 NOTE — ED Provider Notes (Signed)
Medical screening examination/treatment/procedure(s) were conducted as a shared visit with non-physician practitioner(s) and myself.  I personally evaluated the patient during the encounter.  EKG Interpretation   None        Mervin Kung, MD 10/12/13 [phone]

## 2013-10-12 NOTE — Progress Notes (Signed)
Seen and agreed. Patricio Popwell, MD 

## 2013-10-12 NOTE — Progress Notes (Signed)
Writer has observed patient in the dayroom briefly but mostly in the hallway walking. Writer spoke with patient 1:1 and he c/o right shoulder pain along with abdomenal and leg pain. Patient reports that he is in constant pain and has learned to just tolerate it.Writer informed him of next time pain med is available and patient is fine with it.  Patient is hopeful that he will find housing so that he can have his surgery on January 14th. Support and encouragement offered, safety maintained on unit with 15 min checks.
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
## 2013-10-12 NOTE — Progress Notes (Signed)
Psychoeducational Group Note  Date: 10/12/2013 Time: 1015  Group Topic/Focus:  Making Healthy Choices:   The focus of this group is to help patients identify negative/unhealthy choices they were using prior to admission and identify positive/healthier coping strategies to replace them upon discharge.  Participation Level:  Did Not Attend  PAdditional Comments:    10/12/2013,1:10 PM Geraldo Haris, Trixie Rude

## 2013-10-12 NOTE — [facility] Group Notes (Signed)
[place] Group Notes: (Clinical Social Work)   10/12/2013      Type of Therapy:  Group Therapy   Participation Level:  Did Not Attend    Selmer Dominion, LCSW 10/12/2013, 4:29 PM

## 2013-10-12 NOTE — Progress Notes (Signed)
[facility] Group Notes:  (Nursing/MHT/Case Management/Adjunct)  Date:  10/12/2013  Time:  9:06 PM  Type of Therapy:  Group Therapy  Participation Level:  Active  Participation Quality:  Appropriate  Affect:  Appropriate  Cognitive:  Appropriate  Insight:  Appropriate  Engagement in Group:  Engaged  Modes of Intervention:  Discussion  Summary of Progress/Problems:Did not attend the group today.  Nash Shearer 10/12/2013, 9:06 PM

## 2013-10-12 NOTE — Progress Notes (Signed)
Adult Psychoeducational Group Note  Date:  10/12/2013 Time:  2:21 PM  Group Topic/Focus:  Therapeutic Activity   Participation Level:  Did Not Attend  Elisha Headland 10/12/2013, 2:21 PM

## 2013-10-13 MED ORDER — OXYCODONE HCL 10 MG PO TABS: 10.0000 mg | ORAL_TABLET | ORAL | Status: DC | PRN

## 2013-10-13 MED ORDER — TRAZODONE HCL 100 MG PO TABS: 100.0000 mg | ORAL_TABLET | Freq: Every day | ORAL | Status: DC

## 2013-10-13 MED ORDER — NICOTINE 21 MG/24HR TD PT24: 21.0000 mg | MEDICATED_PATCH | Freq: Every day | TRANSDERMAL | Status: DC
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
## 2013-10-13 NOTE — Progress Notes (Signed)
Discharge Note:  Patient discharged with bus pass.  Patient received all his belongings, clothing, miscellaneous items, toiletries, belt, coats, boots, socks, sealed envelope, phone, charger, red hat, lighter, prescriptions, medications.  Suicide prevention information given and discussed with patient who stated he understood and had no questions.  Denied SI and HI.  Denied A/V hallucinations.  Patient stated he appreciated all assistance received from [facility] staff.

## 2013-10-13 NOTE — Tx Team (Signed)
Interdisciplinary Treatment Plan Update (Adult)  Date: 10/13/2013  Time Reviewed:  9:45 AM  Progress in Treatment: Attending groups: Yes Participating in groups:  Yes Taking medication as prescribed:  Yes Tolerating medication:  Yes Family/Significant othe contact made: No, attempts made to talk with pt's ex wife Patient understands diagnosis:  Yes Discussing patient identified problems/goals with staff:  Yes Medical problems stabilized or resolved:  Yes Denies suicidal/homicidal ideation: Yes Issues/concerns per patient self-inventory:  Yes Other:  New problem(s) identified: N/A  Discharge Plan or Barriers: Pt refused follow up  Reason for Continuation of Hospitalization: Stable to d/c  Comments: N/A  Estimated length of stay: D/C today  For review of initial/current patient goals, please see plan of care.  Attendees: Patient:  [name]  10/13/2013 10:27 AM   Family:     Physician:  Dr. Zorita Pang 10/13/2013 10:20 AM   Nursing:   Franco Nones, RN 10/13/2013 10:20 AM   Clinical Social Worker:  Regan Lemming, LCSW 10/13/2013 10:20 AM   Other: Catalina Pizza, PA 10/13/2013 10:20 AM   Other:  Gala Romney, care coordination 10/13/2013 10:20 AM   Other:  Joette Catching, LCSW 10/13/2013 10:20 AM   Other:  Thurnell Garbe, RN 10/13/2013 10:21 AM   Other: Grayland Ormond, RN 10/13/2013 10:21 AM   Other:    Other:    Other:    Other:    Other:     Scribe for Treatment Team:   Ane Payment, 10/13/2013 10:20 AM

## 2013-10-13 NOTE — Progress Notes (Signed)
[facility] Adult Case Management Discharge Plan :  Will you be returning to the same living situation after discharge: Yes,  pt was homeless prior to admission and continues to be homeless at d/c.  Pt verbalized a plan to return to his tent, stating that he'd rather stay in his tent than the shelter.  CSW offered pt shelter options but pt declined at this time At discharge, do you have transportation home?:Yes,  provided pt with a bus pass Do you have the ability to pay for your medications:Yes,  access to meds  Release of information consent forms completed and in the chart;  Patient's signature needed at discharge.  Patient to Follow up at: Follow-up Information   Follow up with Patient refused referrals for follow up.      Patient denies SI/HI:   Yes,  denies SI/HI    Safety Planning and Suicide Prevention discussed:  Yes,  discussed with pt.  Unable to reach pt's ex wife.  Seee suicide prevention education note.    CSW spoke with Ronalee Belts with [employer] about pt's discharge plans.  Ronalee Belts was hopeful that pt would be going to a long term treatment program or that we'd have a place for housing for pt.  CSW explained that pt didn't want a referral for any longer term treatment and didn't want to stay in a shelter.  Ronalee Belts states that he is working with pt on housing.    Ane Payment 10/13/2013, 10:56 AM
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
## 2013-10-13 NOTE — Progress Notes (Signed)
Patient observed out in the hallway and did not attend group this evening. Patient reports he had a visit from the gentleman assisting him with housing from a local church, visited this evening. Patient is concerned he will not be discharged in time for his surgery on the 14th. Writer encouraged patient to speak with his doctor and confirm his discharge. Patient compliant with medications scheduled and voiced no other concerns. Patient denies si/hi/a/v hallucinations. Safety maintained on unit with 15 min checks.

## 2013-10-13 NOTE — BHH Suicide Risk Assessment (Signed)
[facility] INPATIENT:  Family/Significant Other Suicide Prevention Education  Suicide Prevention Education:  Contact Attempts:  Deberah Castle - ex wife [phone]), (name of family member/significant other) has been identified by the patient as the family member/significant other with whom the patient will be residing, and identified as the person(s) who will aid the patient in the event of a mental health crisis.  With written consent from the patient, two attempts were made to provide suicide prevention education, prior to and/or following the patient's discharge.  We were unsuccessful in providing suicide prevention education.  A suicide education pamphlet was given to the patient to share with family/significant other.  Date and time of first attempt: 10/10/13 @ 12:04 pm, voicemail message left Date and time of second attempt: 10/13/13 @ 10:55 am   [name], Miguel Cory 10/13/2013, 10:46 AM

## 2013-10-13 NOTE — BHH Group Notes (Signed)
[facility] LCSW Aftercare Discharge Planning Group Note   10/13/2013  8:45 AM  Participation Quality:  Did Not Attend - pt sleeping in his room  Regan Lemming, [place] 10/13/2013 10:06 AM
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
## 2013-10-14 ENCOUNTER — Other Ambulatory Visit: Payer: Self-pay | Admitting: *Deleted

## 2013-10-15 NOTE — Telephone Encounter (Signed)
Pt presented to front desk asking for a script of pain meds, he was ask if he had any from the script of 1/12, [facility] and stated he had already used those 10. He was told that someone would call him and that the doctor had 48 hrs to respond

## 2013-10-16 ENCOUNTER — Encounter: Payer: Self-pay | Admitting: Internal Medicine

## 2013-10-16 ENCOUNTER — Ambulatory Visit (INDEPENDENT_AMBULATORY_CARE_PROVIDER_SITE_OTHER): Payer: Self-pay | Admitting: Internal Medicine

## 2013-10-16 ENCOUNTER — Other Ambulatory Visit: Payer: Self-pay | Admitting: Internal Medicine

## 2013-10-16 VITALS — BP 106/61 | HR 81 | Temp 97.4°F | Ht 70.0 in | Wt 151.3 lb

## 2013-10-16 DIAGNOSIS — Z5900 Homelessness unspecified: Secondary | ICD-10-CM

## 2013-10-16 DIAGNOSIS — M25519 Pain in unspecified shoulder: Secondary | ICD-10-CM

## 2013-10-16 DIAGNOSIS — M25512 Pain in left shoulder: Secondary | ICD-10-CM

## 2013-10-16 MED ORDER — OXYCODONE HCL 10 MG PO TABS: 10.0000 mg | ORAL_TABLET | ORAL | Status: DC

## 2013-10-16 MED ORDER — SUCRALFATE 1 GM/10ML PO SUSP: 1.0000 g | Freq: Four times a day (QID) | ORAL | Status: DC
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
## 2013-10-16 NOTE — Progress Notes (Signed)
Pt came into clinic for Narcotic Rx.   Rx was not ready, I reviewed last pickup, verified with [facility] # of pills pt recieved. Unable to reach pt's PCP so asked Dr Lynnae January to review records and talk with pt.

## 2013-10-16 NOTE — Progress Notes (Signed)
Patient Discharge Instructions:  No documentation was faxed for [facility].  Per the SW the patient refused follow up.  Patsey Berthold, 10/16/2013, 2:06 PM

## 2013-10-17 DIAGNOSIS — M25512 Pain in left shoulder: Secondary | ICD-10-CM | POA: Insufficient documentation

## 2013-10-17 NOTE — Telephone Encounter (Signed)
Saw  Dr Algis Liming 10/16/13 about Rx.

## 2013-10-17 NOTE — Progress Notes (Signed)
Case discussed with Dr. Sadek soon after the resident saw the patient.  We reviewed the resident's history and exam and pertinent patient test results.  I agree with the assessment, diagnosis, and plan of care documented in the resident's note. 

## 2013-10-17 NOTE — Assessment & Plan Note (Signed)
[place] and is unable not even get food states have been connected with some sort of church agency but is not having much success at this time. Patient is begging for food and instead getting alcohol. Would greatly benefit and seems very motivated to make lifestyle changes. -CSW referral -Patient was provided with food today
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
## 2013-10-23 ENCOUNTER — Other Ambulatory Visit: Payer: Self-pay | Admitting: *Deleted

## 2013-10-23 MED ORDER — OXYCODONE HCL 10 MG PO TABS: 10.0000 mg | ORAL_TABLET | ORAL | Status: DC

## 2013-10-23 NOTE — Telephone Encounter (Signed)
Last refill 1/15 for # 60.  Pt states he takes 6 a day so will be out of Saturday. Pt # [phone]

## 2013-10-23 NOTE — Telephone Encounter (Signed)
Pt informed Rx is ready 

## 2013-10-30 ENCOUNTER — Encounter: Payer: Self-pay | Admitting: Internal Medicine

## 2013-10-30 ENCOUNTER — Ambulatory Visit (INDEPENDENT_AMBULATORY_CARE_PROVIDER_SITE_OTHER): Payer: Self-pay | Admitting: Internal Medicine

## 2013-10-30 VITALS — BP 116/73 | HR 67 | Temp 97.3°F | Wt 154.9 lb

## 2013-10-30 DIAGNOSIS — Z79899 Other long term (current) drug therapy: Secondary | ICD-10-CM

## 2013-10-30 DIAGNOSIS — Z0289 Encounter for other administrative examinations: Secondary | ICD-10-CM
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
## 2013-10-31 ENCOUNTER — Other Ambulatory Visit: Payer: Self-pay | Admitting: Internal Medicine

## 2013-10-31 ENCOUNTER — Telehealth: Payer: Self-pay | Admitting: *Deleted

## 2013-10-31 LAB — PRESCRIPTION ABUSE MONITORING 15P, URINE
AMPHETAMINE/METH: NEGATIVE ng/mL
BENZODIAZEPINE SCREEN, URINE: NEGATIVE ng/mL
BUPRENORPHINE, URINE: NEGATIVE ng/mL
Barbiturate Screen, Urine: NEGATIVE ng/mL
COCAINE METABOLITES: NEGATIVE ng/mL
CREATININE, URINE: 15.36 mg/dL — AB (ref >20.0)
Cannabinoid Scrn, Ur: NEGATIVE ng/mL
Carisoprodol, Urine: NEGATIVE ng/mL
Fentanyl, Ur: NEGATIVE ng/mL
MEPERIDINE UR: NEGATIVE ng/mL
METHADONE SCREEN, URINE: NEGATIVE ng/mL
Opiate Screen, Urine: NEGATIVE ng/mL
Propoxyphene: NEGATIVE ng/mL
TRAMADOL UR: NEGATIVE ng/mL
Zolpidem, Urine: NEGATIVE ng/mL

## 2013-10-31 NOTE — Telephone Encounter (Signed)
Mr Valley hung around looking for me this morning so we talked at length about his being denied  a refill on his narcotic prescription. He could not understand how his urine could have come back negative, so I explained how that could happen and he was now in violation of his pain contract, which I had to explain to him was a contract for him to comply not a contract that guarantees him medications. I explained Dr. Ferd Glassing plan once again stressing his medical review at rehab at [facility] in [place].  He will be registering Saturday. I also suggested that "perhaps" he would not continue to to get narcotics from [facility] if he continued to partially attend rehab.

## 2013-11-03 ENCOUNTER — Encounter: Payer: Self-pay | Admitting: Internal Medicine

## 2013-11-03 LAB — OXYCODONE, URINE (LC/MS-MS)
Noroxycodone, Ur: 799 ng/mL
OXYMORPHONE, URINE: 550 ng/mL
Oxycodone, ur: 676 ng/mL

## 2013-11-03 NOTE — Progress Notes (Signed)
I saw and evaluated the patient.  I personally confirmed the key portions of the history and exam documented by Dr. Gill and I reviewed pertinent patient test results.  The assessment, diagnosis, and plan were formulated together and I agree with the documentation in the resident's note. 

## 2013-11-10 ENCOUNTER — Other Ambulatory Visit: Payer: Self-pay | Admitting: *Deleted

## 2013-11-10 NOTE — Telephone Encounter (Signed)
Pt leaves a message stating the rehab cant take him at the moment so he will need pain med

## 2013-11-11 NOTE — Telephone Encounter (Signed)
Spoke w/ pt and informed no more narcotics, he stated he has a pain contract so the md in clinic needs to write his scripts, he was informed that he had violated that contract so there was no contract, states he will go to ED. It was suggested to him that he be proactive and call the rehab every morning to see if a space has come available.

## 2013-11-13 ENCOUNTER — Encounter (HOSPITAL_COMMUNITY): Payer: Self-pay | Admitting: Emergency Medicine

## 2013-11-13 ENCOUNTER — Emergency Department (HOSPITAL_COMMUNITY)
Admission: EM | Admit: 2013-11-13 | Discharge: 2013-11-16 | Disposition: A | Discharge status: Home / Self Care | Payer: Self-pay | Attending: Emergency Medicine | Admitting: Emergency Medicine

## 2013-11-13 DIAGNOSIS — M25519 Pain in unspecified shoulder: Secondary | ICD-10-CM | POA: Insufficient documentation

## 2013-11-13 DIAGNOSIS — Z5900 Homelessness unspecified: Secondary | ICD-10-CM | POA: Insufficient documentation

## 2013-11-13 DIAGNOSIS — Z7982 Long term (current) use of aspirin: Secondary | ICD-10-CM | POA: Insufficient documentation

## 2013-11-13 DIAGNOSIS — Z8619 Personal history of other infectious and parasitic diseases: Secondary | ICD-10-CM | POA: Insufficient documentation

## 2013-11-13 DIAGNOSIS — Z8709 Personal history of other diseases of the respiratory system: Secondary | ICD-10-CM | POA: Insufficient documentation

## 2013-11-13 DIAGNOSIS — Z8701 Personal history of pneumonia (recurrent): Secondary | ICD-10-CM | POA: Insufficient documentation

## 2013-11-13 DIAGNOSIS — M129 Arthropathy, unspecified: Secondary | ICD-10-CM | POA: Insufficient documentation

## 2013-11-13 DIAGNOSIS — F332 Major depressive disorder, recurrent severe without psychotic features: Secondary | ICD-10-CM | POA: Insufficient documentation

## 2013-11-13 DIAGNOSIS — G8929 Other chronic pain: Secondary | ICD-10-CM | POA: Insufficient documentation

## 2013-11-13 DIAGNOSIS — K219 Gastro-esophageal reflux disease without esophagitis: Secondary | ICD-10-CM | POA: Insufficient documentation

## 2013-11-13 DIAGNOSIS — F10929 Alcohol use, unspecified with intoxication, unspecified: Secondary | ICD-10-CM

## 2013-11-13 DIAGNOSIS — Z79899 Other long term (current) drug therapy: Secondary | ICD-10-CM | POA: Insufficient documentation

## 2013-11-13 DIAGNOSIS — F1010 Alcohol abuse, uncomplicated: Secondary | ICD-10-CM | POA: Insufficient documentation

## 2013-11-13 DIAGNOSIS — F17200 Nicotine dependence, unspecified, uncomplicated: Secondary | ICD-10-CM | POA: Insufficient documentation

## 2013-11-13 DIAGNOSIS — Z87448 Personal history of other diseases of urinary system: Secondary | ICD-10-CM | POA: Insufficient documentation

## 2013-11-13 DIAGNOSIS — F411 Generalized anxiety disorder: Secondary | ICD-10-CM | POA: Insufficient documentation

## 2013-11-13 DIAGNOSIS — Z8781 Personal history of (healed) traumatic fracture: Secondary | ICD-10-CM | POA: Insufficient documentation

## 2013-11-13 DIAGNOSIS — F1020 Alcohol dependence, uncomplicated: Secondary | ICD-10-CM

## 2013-11-13 LAB — COMPREHENSIVE METABOLIC PANEL
ALT: 92 U/L — ABNORMAL HIGH (ref 0–53)
AST: 74 U/L — ABNORMAL HIGH (ref 0–37)
Albumin: 3.5 g/dL (ref 3.5–5.2)
Alkaline Phosphatase: 84 U/L (ref 39–117)
BILIRUBIN TOTAL: 0.2 mg/dL — AB (ref 0.3–1.2)
BUN: 7 mg/dL (ref 6–23)
CHLORIDE: 98 meq/L (ref 96–112)
CO2: 23 meq/L (ref 19–32)
CREATININE: 0.78 mg/dL (ref 0.50–1.35)
Calcium: 9.2 mg/dL (ref 8.4–10.5)
Glucose, Bld: 89 mg/dL (ref 70–99)
Potassium: 4.2 mEq/L (ref 3.7–5.3)
Sodium: 137 mEq/L (ref 137–147)
Total Protein: 7.7 g/dL (ref 6.0–8.3)

## 2013-11-13 LAB — CBC
HEMATOCRIT: 39.1 % (ref 39.0–52.0)
Hemoglobin: 13.6 g/dL (ref 13.0–17.0)
MCH: 33.7 pg (ref 26.0–34.0)
MCHC: 34.8 g/dL (ref 30.0–36.0)
MCV: 97 fL (ref 78.0–100.0)
PLATELETS: 389 10*3/uL (ref 150–400)
RBC: 4.03 MIL/uL — AB (ref 4.22–5.81)
RDW: 13.5 % (ref 11.5–15.5)
WBC: 9.3 10*3/uL (ref 4.0–10.5)

## 2013-11-13 LAB — RAPID URINE DRUG SCREEN, HOSP PERFORMED
Amphetamines: NOT DETECTED
BARBITURATES: NOT DETECTED
Benzodiazepines: POSITIVE — AB
Cocaine: NOT DETECTED
OPIATES: POSITIVE — AB
TETRAHYDROCANNABINOL: NOT DETECTED

## 2013-11-13 LAB — SALICYLATE LEVEL: Salicylate Lvl: <2 mg/dL — ABNORMAL LOW (ref 2.8–20.0)

## 2013-11-13 LAB — ACETAMINOPHEN LEVEL

## 2013-11-13 LAB — ETHANOL: ALCOHOL ETHYL (B): 164 mg/dL — AB (ref 0–11)

## 2013-11-13 MED ORDER — VITAMIN B-1 100 MG PO TABS
100.0000 mg | ORAL_TABLET | Freq: Every day | ORAL | Status: DC
  Administered 2013-11-14 – 2013-11-16 (×3): 100 mg via ORAL
  Filled 2013-11-13 (×3): qty 1

## 2013-11-13 MED ORDER — LORAZEPAM 1 MG PO TABS: 0.0000 mg | ORAL_TABLET | Freq: Two times a day (BID) | ORAL | Status: DC

## 2013-11-13 MED ORDER — TRAZODONE HCL 50 MG PO TABS
100.0000 mg | ORAL_TABLET | Freq: Every day | ORAL | Status: DC
  Administered 2013-11-13 – 2013-11-15 (×3): 100 mg via ORAL
  Filled 2013-11-13 (×3): qty 2

## 2013-11-13 MED ORDER — LORAZEPAM 1 MG PO TABS
0.0000 mg | ORAL_TABLET | Freq: Four times a day (QID) | ORAL | Status: AC
Start: 2013-11-14 — End: 2013-11-15
  Administered 2013-11-14 – 2013-11-15 (×3): 1 mg via ORAL
  Filled 2013-11-13 (×3): qty 1

## 2013-11-13 MED ORDER — THIAMINE HCL 100 MG/ML IJ SOLN: 100.0000 mg | Freq: Every day | INTRAMUSCULAR | Status: DC

## 2013-11-13 MED ORDER — ASPIRIN EC 81 MG PO TBEC
81.0000 mg | DELAYED_RELEASE_TABLET | Freq: Every day | ORAL | Status: DC
  Administered 2013-11-14 – 2013-11-16 (×3): 81 mg via ORAL
  Filled 2013-11-13 (×3): qty 1

## 2013-11-13 MED ORDER — ONDANSETRON HCL 4 MG PO TABS
4.0000 mg | ORAL_TABLET | Freq: Three times a day (TID) | ORAL | Status: DC | PRN
Start: 2013-11-13 — End: 2013-11-16

## 2013-11-13 MED ORDER — PANTOPRAZOLE SODIUM 40 MG PO TBEC
40.0000 mg | DELAYED_RELEASE_TABLET | Freq: Every day | ORAL | Status: DC
  Administered 2013-11-14 – 2013-11-16 (×3): 40 mg via ORAL
  Filled 2013-11-13 (×3): qty 1

## 2013-11-13 MED ORDER — NICOTINE 21 MG/24HR TD PT24
21.0000 mg | MEDICATED_PATCH | Freq: Every day | TRANSDERMAL | Status: DC
  Administered 2013-11-13 – 2013-11-16 (×4): 21 mg via TRANSDERMAL
  Filled 2013-11-13 (×4): qty 1

## 2013-11-13 NOTE — ED Notes (Signed)
Please call Wynonia Hazard at [phone] (with [place]).

## 2013-11-13 NOTE — ED Notes (Signed)
Sitter at bedside.

## 2013-11-13 NOTE — ED Notes (Signed)
House coverage notified of need for sitter

## 2013-11-13 NOTE — ED Provider Notes (Signed)
Patient states, that if he can't get renewals of his narcotic prescriptions.  He may as well, and all this was stated in the TTS valuate her who then proceeded with IVC papers.  Patient has been placed on CIWA were called to to his chronic alcoholism.  Placement will be sought  Garald Balding, NP 11/13/13 2212
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
## 2013-11-13 NOTE — ED Notes (Signed)
Patient currently talking with Beverely Low at [facility] via tele psych.

## 2013-11-13 NOTE — ED Notes (Signed)
Tele-psych monitor placed in room at this time per request from [place] at [facility].
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
## 2013-11-14 ENCOUNTER — Emergency Department (HOSPITAL_COMMUNITY): Payer: Self-pay

## 2013-11-14 MED ORDER — HYDROCODONE-ACETAMINOPHEN 5-325 MG PO TABS
2.0000 | ORAL_TABLET | Freq: Once | ORAL | Status: AC
  Administered 2013-11-14: 2 via ORAL
  Filled 2013-11-14: qty 2

## 2013-11-14 MED ORDER — HYDROCODONE-ACETAMINOPHEN 5-325 MG PO TABS
2.0000 | ORAL_TABLET | Freq: Four times a day (QID) | ORAL | Status: DC | PRN
  Administered 2013-11-14 – 2013-11-16 (×6): 2 via ORAL
  Filled 2013-11-14 (×6): qty 2

## 2013-11-14 NOTE — ED Notes (Signed)
WAS NOTIFIED BY MARCUS HARVEY THAT PATIENT NEEDED  TO BE IVC, NOTIFY AC, MARCUS NOTIFY [place] NP, AS WELL AS CHARGE NURSE WAS NOTIFY.

## 2013-11-14 NOTE — ED Notes (Signed)
Relieved sitter for break.

## 2013-11-14 NOTE — ED Provider Notes (Signed)
Patient has been evaluated by psych. Recommend inpatient placement. No beds at behavioral health.  [name]. Alvino Chapel, MD 11/14/13 [phone]

## 2013-11-14 NOTE — ED Notes (Signed)
Patient asked for and received a cup of coffee.  

## 2013-11-14 NOTE — ED Notes (Signed)
PATIENT BELONGS WERE INVENTORY AND SENT TO HOLDING AREA, PATIENT VALUABLES LOCK IN SECURITY AND ONE BOTTLE OF MEDS PHENERGAN 25MG   WITH 6 TABLETS TO BE LOCK UP IN PHARMACY.

## 2013-11-14 NOTE — ED Notes (Addendum)
Pt requesting his pain medication for chronic pain

## 2013-11-14 NOTE — ED Notes (Signed)
Patient asked for and received peanut butter and crackers.

## 2013-11-14 NOTE — Progress Notes (Signed)
MHT intiated inpatient treatment at the following hospitals with bed availability:  1)[facility]-declined  2)[facility]-faxed referral 3)[facility]-faxed referral 4)[facility]-faxed referral 5)[facility]-faxed referral 6)[facility]-faxed referral 7)[facility]-no geropsych beds 8)[facility]-no geropsych beds 9)[facility]-no geropsych beds 10)[facility]-no beds 11)[facility]-no outside referrals due to acuity   Wyvonnia Dusky, MHT/NS

## 2013-11-14 NOTE — ED Notes (Signed)
Pt walked around nurses station, no visible difficulty ambulating. Pt returned to room asking for pain medications, states he takes pain meds every 4 hours.

## 2013-11-14 NOTE — ED Notes (Signed)
Patient requested and received a cup of coffee.

## 2013-11-14 NOTE — ED Provider Notes (Signed)
Medical screening examination/treatment/procedure(s) were conducted as a shared visit with non-physician practitioner(s) and myself.  I personally evaluated the patient during the encounter.  EKG Interpretation   None         Charles B. Karle Starch, MD 11/14/13 [phone]

## 2013-11-14 NOTE — Progress Notes (Signed)
[facility] called to declined pt due to acuity.  Wyvonnia Dusky, MHT/NS
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
## 2013-11-15 NOTE — ED Notes (Signed)
Dietary called to have another tray brought here due to pt's portion was small.

## 2013-11-15 NOTE — ED Notes (Signed)
Old patch given at later time than scheduled, old patch from r arm removed at this time, patient given new nicotine patch to L arm.

## 2013-11-15 NOTE — Progress Notes (Addendum)
CSW met with patient to assess for needed community resources. Patient states that he is here due to a suicide attempt. Patient is homeless and currently living in the woods. Patient has an ex-wife who has been able to assist him in the past, and has a son and daughter who offer some support. Patient was calm and cooperative during assessment. Patient requests information on housing. CSW provided patient with listing of low income housing in the area, a list of homeless shelters, information on the [facility], a buss pass, information on DSS and [employer] transportation, and a Pharmacologist. Patient was appreciative of CSW assistance. Patient provided CSW with info for contact person [name] [phone] (friend).  Tilden Fossa, MSW, [place] Clinical Social Worker [facility] Emergency Dept. [phone]

## 2013-11-15 NOTE — BH Assessment (Signed)
[place] Assessment Progress Note  Pt seen for reassessment this day @ 0915 by this clinician via tele assessment after appt scheduled with pt's nurse, Santiago Glad.  Pt calm, cooperative and oriented x 4.  Pt presents with depressed mood and continues to report SI.  Pt stated he is having "a hard time right now," and has been living in the woods.  Pt stated he has no support from his family and struggles with chronic pain.  He reported he was going to talk with a Education officer, museum at [employer] regarding housing options.  Pt denies HI or psychosis.  Pt is pending multiple facilities for inpatient treatment, but has been declined at [facility].  Pt pending [place], [place], [place].  TTS will continue to seek placement for the pt.  Shaune Pascal, MS, [facility] Licensed Professional Counselor Triage Specialist

## 2013-11-16 ENCOUNTER — Encounter (HOSPITAL_COMMUNITY): Payer: Self-pay | Admitting: Psychiatry

## 2013-11-16 DIAGNOSIS — F1010 Alcohol abuse, uncomplicated: Secondary | ICD-10-CM

## 2013-11-16 DIAGNOSIS — G8929 Other chronic pain: Secondary | ICD-10-CM

## 2013-11-16 DIAGNOSIS — F1020 Alcohol dependence, uncomplicated: Secondary | ICD-10-CM

## 2013-11-16 MED ORDER — OXYCODONE HCL 5 MG PO TABS
10.0000 mg | ORAL_TABLET | Freq: Four times a day (QID) | ORAL | Status: DC
  Administered 2013-11-16: 10 mg via ORAL
  Filled 2013-11-16: qty 2

## 2013-11-16 NOTE — ED Provider Notes (Signed)
Pt re-evaluated by Psych today and cleared for discharge. IVC rescinded. Pt no longer suicidal. Asking for bus pass to [place] for Ortho appointment but that appointment has not yet been scheduled. Probably won't be for 4-5 more weeks. Pt asking for Rx for pain pills advised that the ED does not manage chronic pain. Followup with PCP for pain management.   Aarohi Redditt B. Karle Starch, MD 11/16/13 [phone]
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
## 2013-11-16 NOTE — ED Notes (Signed)
States was told not to take pain meds w/Tyelnol d/t liver problems and ETOH.

## 2013-11-16 NOTE — BH Assessment (Signed)
Spoke to Waylan Boga NP after she assessed patient, [name] recommended discharge for patient. Per Waylan Boga NP this writer needed to run patient by Dr. Darleene Cleaver, Dr. Darleene Cleaver agreed with recommendation for discharge. Becky Agricultural consultant at [employer] made aware of recommendation.

## 2013-11-16 NOTE — BH Assessment (Signed)
Per Joann Glover, AC at [facility], adult unit is at capacity. Contacted the following facilities for placement:  [facility]: At capacity [facility]: At capacity [facility]: At capacity [facility]: At capacity [facility]: At capacity [facility]: At capacity [facility]: At capacity [facility]: At capacity [facility]: At capacity [facility]: At capacity [facility]: At capacity [facility]: At capacity [facility]: At capacity [facility]: At capacity  Garyn Arlotta Ellis Jarielys Girardot Jr, LPC, N[facility] Triage Specialist 

## 2013-11-16 NOTE — BH Assessment (Signed)
[place] Assessment Progress Note   Called EDP Ghim, and he ordered a tele psych for pt for further assessment and evaluation, as pt is requesting to go home @0945 .  Pt denies SI, stating he just wants his pain medications and to go home per pt's nurse, Jacqlyn Larsen.  Tele psych will be scheduled with a TTS extender.  Shaune Pascal, MS, [facility] Licensed Professional Counselor Triage Specialist

## 2013-11-16 NOTE — Discharge Instructions (Signed)
Alcohol Intoxication Alcohol intoxication occurs when the amount of alcohol that a person has consumed impairs his or her ability to mentally and physically function. Alcohol directly impairs the normal chemical activity of the brain. Drinking large amounts of alcohol can lead to changes in mental function and behavior, and it can cause many physical effects that can be harmful.  Alcohol intoxication can range in severity from mild to very severe. Various factors can affect the level of intoxication that occurs, such as the person's age, gender, weight, frequency of alcohol consumption, and the presence of other medical conditions (such as diabetes, seizures, or heart conditions). Dangerous levels of alcohol intoxication may occur when people drink large amounts of alcohol in a short period (binge drinking). Alcohol can also be especially dangerous when combined with certain prescription medicines or "recreational" drugs. SIGNS AND SYMPTOMS Some common signs and symptoms of mild alcohol intoxication include:  Loss of coordination.  Changes in mood and behavior.  Impaired judgment.  Slurred speech. As alcohol intoxication progresses to more severe levels, other signs and symptoms will appear. These may include:  Vomiting.  Confusion and impaired memory.  Slowed breathing.  Seizures.  Loss of consciousness. DIAGNOSIS  Your health care provider will take a medical history and perform a physical exam. You will be asked about the amount and type of alcohol you have consumed. Blood tests will be done to measure the concentration of alcohol in your blood. In many places, your blood alcohol level must be lower than 80 mg/dL (0.08%) to legally drive. However, many dangerous effects of alcohol can occur at much lower levels.  TREATMENT  People with alcohol intoxication often do not require treatment. Most of the effects of alcohol intoxication are temporary, and they go away as the alcohol naturally  leaves the body. Your health care provider will monitor your condition until you are stable enough to go home. Fluids are sometimes given through an IV access tube to help prevent dehydration.  HOME CARE INSTRUCTIONS  Do not drive after drinking alcohol.  Stay hydrated. Drink enough water and fluids to keep your urine clear or pale yellow. Avoid caffeine.   Only take over-the-counter or prescription medicines as directed by your health care provider.  SEEK MEDICAL CARE IF:   You have persistent vomiting.   You do not feel better after a few days.  You have frequent alcohol intoxication. Your health care provider can help determine if you should see a substance use treatment counselor. SEEK IMMEDIATE MEDICAL CARE IF:   You become shaky or tremble when you try to stop drinking.   You shake uncontrollably (seizure).   You throw up (vomit) blood. This may be bright red or may look like black coffee grounds.   You have blood in your stool. This may be bright red or may appear as a black, tarry, bad smelling stool.   You become lightheaded or faint.  MAKE SURE YOU:   Understand these instructions.  Will watch your condition.  Will get help right away if you are not doing well or get worse. Document Released: 06/28/2005 Document Revised: 05/21/2013 Document Reviewed: 02/21/2013 [facility] Patient Information 2014 [place].    Chronic Pain Chronic pain can be defined as pain that is off and on and lasts for 3 6 months or longer. Many things cause chronic pain, which can make it difficult to make a diagnosis. There are many treatment options available for chronic pain. However, finding a treatment that works well for  you may require trying various approaches until the right one is found. Many people benefit from a combination of two or more types of treatment to control their pain. SYMPTOMS  Chronic pain can occur anywhere in the body and can range from mild to very  severe. Some types of chronic pain include: Headache. Low back pain. Cancer pain. Arthritis pain. Neurogenic pain. This is pain resulting from damage to nerves. People with chronic pain may also have other symptoms such as: Depression. Anger. Insomnia. Anxiety. DIAGNOSIS  Your health care provider will help diagnose your condition over time. In many cases, the initial focus will be on excluding possible conditions that could be causing the pain. Depending on your symptoms, your health care provider may order tests to diagnose your condition. Some of these tests may include:  Blood tests.  CT scan.  MRI.  X-rays.  Ultrasounds.  Nerve conduction studies.  You may need to see a specialist.  TREATMENT  Finding treatment that works well may take time. You may be referred to a pain specialist. He or she may prescribe medicine or therapies, such as:  Mindful meditation or yoga. Shots (injections) of numbing or pain-relieving medicines into the spine or area of pain. Local electrical stimulation. Acupuncture.  Massage therapy.  Aroma, color, light, or sound therapy.  Biofeedback.  Working with a physical therapist to keep from getting stiff.  Regular, gentle exercise.  Cognitive or behavioral therapy.  Group support.  Sometimes, surgery may be recommended.  HOME CARE INSTRUCTIONS  Take all medicines as directed by your health care provider.  Lessen stress in your life by relaxing and doing things such as listening to calming music.  Exercise or be active as directed by your health care provider.  Eat a healthy diet and include things such as vegetables, fruits, fish, and lean meats in your diet.  Keep all follow-up appointments with your health care provider.  Attend a support group with others suffering from chronic pain. SEEK MEDICAL CARE IF:  Your pain gets worse.  You develop a new pain that was not there before.  You cannot tolerate medicines given to  you by your health care provider.  You have new symptoms since your last visit with your health care provider.  SEEK IMMEDIATE MEDICAL CARE IF:  You feel weak.  You have decreased sensation or numbness.  You lose control of bowel or bladder function.  Your pain suddenly gets much worse.  You develop shaking. You develop chills. You develop confusion. You develop chest pain. You develop shortness of breath.  MAKE SURE YOU: Understand these instructions. Will watch your condition. Will get help right away if you are not doing well or get worse. Document Released: 06/10/2002 Document Revised: 05/21/2013 Document Reviewed: 03/14/2013 [facility] Patient Information 2014 [place].

## 2013-11-17 ENCOUNTER — Inpatient Hospital Stay (HOSPITAL_COMMUNITY)
Admission: AD | Admit: 2013-11-17 | Discharge: 2013-11-19 | DRG: 439 | Disposition: A | Discharge status: Home / Self Care | Payer: Self-pay | Source: Ambulatory Visit | Attending: Internal Medicine | Admitting: Internal Medicine

## 2013-11-17 ENCOUNTER — Encounter: Payer: Self-pay | Admitting: Internal Medicine

## 2013-11-17 ENCOUNTER — Other Ambulatory Visit: Payer: Self-pay

## 2013-11-17 ENCOUNTER — Ambulatory Visit: Payer: Self-pay | Admitting: Internal Medicine

## 2013-11-17 ENCOUNTER — Ambulatory Visit (INDEPENDENT_AMBULATORY_CARE_PROVIDER_SITE_OTHER): Payer: Self-pay | Admitting: Internal Medicine

## 2013-11-17 VITALS — BP 149/84 | HR 63 | Temp 96.6°F | Ht 71.0 in | Wt 155.8 lb

## 2013-11-17 DIAGNOSIS — F1020 Alcohol dependence, uncomplicated: Secondary | ICD-10-CM

## 2013-11-17 DIAGNOSIS — F17200 Nicotine dependence, unspecified, uncomplicated: Secondary | ICD-10-CM | POA: Diagnosis present

## 2013-11-17 DIAGNOSIS — M25519 Pain in unspecified shoulder: Secondary | ICD-10-CM

## 2013-11-17 DIAGNOSIS — Z91199 Patient's noncompliance with other medical treatment and regimen due to unspecified reason: Secondary | ICD-10-CM

## 2013-11-17 DIAGNOSIS — F10239 Alcohol dependence with withdrawal, unspecified: Secondary | ICD-10-CM

## 2013-11-17 DIAGNOSIS — F10939 Alcohol use, unspecified with withdrawal, unspecified: Secondary | ICD-10-CM

## 2013-11-17 DIAGNOSIS — K529 Noninfective gastroenteritis and colitis, unspecified: Secondary | ICD-10-CM

## 2013-11-17 DIAGNOSIS — K219 Gastro-esophageal reflux disease without esophagitis: Secondary | ICD-10-CM | POA: Diagnosis present

## 2013-11-17 DIAGNOSIS — M25569 Pain in unspecified knee: Secondary | ICD-10-CM

## 2013-11-17 DIAGNOSIS — G8929 Other chronic pain: Secondary | ICD-10-CM

## 2013-11-17 DIAGNOSIS — F411 Generalized anxiety disorder: Secondary | ICD-10-CM | POA: Diagnosis present

## 2013-11-17 DIAGNOSIS — R109 Unspecified abdominal pain: Secondary | ICD-10-CM | POA: Diagnosis present

## 2013-11-17 DIAGNOSIS — E0590 Thyrotoxicosis, unspecified without thyrotoxic crisis or storm: Secondary | ICD-10-CM | POA: Diagnosis present

## 2013-11-17 DIAGNOSIS — IMO0002 Reserved for concepts with insufficient information to code with codable children: Secondary | ICD-10-CM

## 2013-11-17 DIAGNOSIS — K859 Acute pancreatitis without necrosis or infection, unspecified: Principal | ICD-10-CM

## 2013-11-17 DIAGNOSIS — S46009A Unspecified injury of muscle(s) and tendon(s) of the rotator cuff of unspecified shoulder, initial encounter: Secondary | ICD-10-CM

## 2013-11-17 DIAGNOSIS — M25512 Pain in left shoulder: Secondary | ICD-10-CM

## 2013-11-17 DIAGNOSIS — K861 Other chronic pancreatitis: Secondary | ICD-10-CM | POA: Diagnosis present

## 2013-11-17 DIAGNOSIS — Z5900 Homelessness unspecified: Secondary | ICD-10-CM

## 2013-11-17 DIAGNOSIS — N400 Enlarged prostate without lower urinary tract symptoms: Secondary | ICD-10-CM

## 2013-11-17 DIAGNOSIS — Z9119 Patient's noncompliance with other medical treatment and regimen: Secondary | ICD-10-CM

## 2013-11-17 DIAGNOSIS — F339 Major depressive disorder, recurrent, unspecified: Secondary | ICD-10-CM

## 2013-11-17 DIAGNOSIS — B1920 Unspecified viral hepatitis C without hepatic coma: Secondary | ICD-10-CM

## 2013-11-17 DIAGNOSIS — Z7982 Long term (current) use of aspirin: Secondary | ICD-10-CM

## 2013-11-17 DIAGNOSIS — Z79899 Other long term (current) drug therapy: Secondary | ICD-10-CM

## 2013-11-17 DIAGNOSIS — G894 Chronic pain syndrome: Secondary | ICD-10-CM | POA: Diagnosis present

## 2013-11-17 DIAGNOSIS — R112 Nausea with vomiting, unspecified: Secondary | ICD-10-CM

## 2013-11-17 DIAGNOSIS — F332 Major depressive disorder, recurrent severe without psychotic features: Secondary | ICD-10-CM

## 2013-11-17 LAB — CBC WITH DIFFERENTIAL/PLATELET
BASOS ABS: 0 10*3/uL (ref 0.0–0.1)
Basophils Relative: 0 % (ref 0–1)
EOS PCT: 1 % (ref 0–5)
Eosinophils Absolute: 0.1 10*3/uL (ref 0.0–0.7)
HCT: 44.7 % (ref 39.0–52.0)
Hemoglobin: 15.6 g/dL (ref 13.0–17.0)
LYMPHS ABS: 2.4 10*3/uL (ref 0.7–4.0)
Lymphocytes Relative: 20 % (ref 12–46)
MCH: 34.2 pg — AB (ref 26.0–34.0)
MCHC: 34.9 g/dL (ref 30.0–36.0)
MCV: 98 fL (ref 78.0–100.0)
Monocytes Absolute: 0.7 10*3/uL (ref 0.1–1.0)
Monocytes Relative: 6 % (ref 3–12)
Neutro Abs: 8.8 10*3/uL — ABNORMAL HIGH (ref 1.7–7.7)
Neutrophils Relative %: 73 % (ref 43–77)
PLATELETS: 485 10*3/uL — AB (ref 150–400)
RBC: 4.56 MIL/uL (ref 4.22–5.81)
RDW: 13.7 % (ref 11.5–15.5)
WBC: 12.1 10*3/uL — ABNORMAL HIGH (ref 4.0–10.5)

## 2013-11-17 LAB — COMPLETE METABOLIC PANEL WITH GFR
ALBUMIN: 4.2 g/dL (ref 3.5–5.2)
ALT: 82 U/L — AB (ref 0–53)
AST: 58 U/L — AB (ref 0–37)
Alkaline Phosphatase: 86 U/L (ref 39–117)
BUN: 17 mg/dL (ref 6–23)
CHLORIDE: 99 meq/L (ref 96–112)
CO2: 23 mEq/L (ref 19–32)
Calcium: 10.1 mg/dL (ref 8.4–10.5)
Creat: 0.74 mg/dL (ref 0.50–1.35)
GFR, Est African American: >89 mL/min
GFR, Est Non African American: >89 mL/min
Glucose, Bld: 120 mg/dL — ABNORMAL HIGH (ref 70–99)
Potassium: 4.3 mEq/L (ref 3.5–5.3)
Sodium: 139 mEq/L (ref 135–145)
Total Bilirubin: 0.3 mg/dL (ref 0.3–1.2)
Total Protein: 8.8 g/dL — ABNORMAL HIGH (ref 6.0–8.3)

## 2013-11-17 LAB — URINALYSIS, ROUTINE W REFLEX MICROSCOPIC
Glucose, UA: NEGATIVE mg/dL
HGB URINE DIPSTICK: NEGATIVE
Ketones, ur: 15 mg/dL — AB
Leukocytes, UA: NEGATIVE
NITRITE: NEGATIVE
Protein, ur: 100 mg/dL — AB
SPECIFIC GRAVITY, URINE: 1.031 — AB (ref 1.005–1.030)
UROBILINOGEN UA: 1 mg/dL (ref 0.0–1.0)
pH: 6 (ref 5.0–8.0)

## 2013-11-17 LAB — MAGNESIUM: MAGNESIUM: 2 mg/dL (ref 1.5–2.5)

## 2013-11-17 LAB — GLUCOSE, CAPILLARY: Glucose-Capillary: 146 mg/dL — ABNORMAL HIGH (ref 70–99)

## 2013-11-17 LAB — RAPID URINE DRUG SCREEN, HOSP PERFORMED
Amphetamines: NOT DETECTED
Barbiturates: NOT DETECTED
Benzodiazepines: POSITIVE — AB
Cocaine: NOT DETECTED
OPIATES: POSITIVE — AB
TETRAHYDROCANNABINOL: POSITIVE — AB

## 2013-11-17 LAB — ETHANOL: Alcohol, Ethyl (B): <11 mg/dL (ref 0–11)

## 2013-11-17 LAB — TROPONIN I

## 2013-11-17 LAB — URINE MICROSCOPIC-ADD ON

## 2013-11-17 LAB — PHOSPHORUS: PHOSPHORUS: 3.9 mg/dL (ref 2.3–4.6)

## 2013-11-17 LAB — LIPASE: Lipase: 45 U/L (ref 11–59)

## 2013-11-17 LAB — LACTIC ACID, PLASMA: LACTIC ACID: 2.2 mmol/L (ref 0.5–2.2)

## 2013-11-17 MED ORDER — OXYCODONE-ACETAMINOPHEN 5-325 MG PO TABS
1.0000 | ORAL_TABLET | ORAL | Status: DC | PRN
  Administered 2013-11-17: 1 via ORAL
  Filled 2013-11-17: qty 1

## 2013-11-17 MED ORDER — LORAZEPAM 1 MG PO TABS
0.0000 mg | ORAL_TABLET | Freq: Four times a day (QID) | ORAL | Status: DC
  Administered 2013-11-17: 1 mg via ORAL
  Administered 2013-11-18: 2 mg via ORAL
  Administered 2013-11-18: 1 mg via ORAL
  Filled 2013-11-17: qty 2
  Filled 2013-11-17 (×2): qty 1

## 2013-11-17 MED ORDER — PANTOPRAZOLE SODIUM 40 MG PO TBEC
40.0000 mg | DELAYED_RELEASE_TABLET | Freq: Every day | ORAL | Status: DC
  Administered 2013-11-17 – 2013-11-19 (×3): 40 mg via ORAL
  Filled 2013-11-17 (×3): qty 1

## 2013-11-17 MED ORDER — ZOLPIDEM TARTRATE 5 MG PO TABS: 5.0000 mg | ORAL_TABLET | Freq: Every evening | ORAL | Status: DC | PRN

## 2013-11-17 MED ORDER — MORPHINE SULFATE 2 MG/ML IJ SOLN
2.0000 mg | INTRAMUSCULAR | Status: DC | PRN
  Administered 2013-11-17 – 2013-11-18 (×5): 2 mg via INTRAVENOUS
  Filled 2013-11-17 (×5): qty 1

## 2013-11-17 MED ORDER — LORAZEPAM 1 MG PO TABS: 1.0000 mg | ORAL_TABLET | Freq: Three times a day (TID) | ORAL | Status: DC | PRN

## 2013-11-17 MED ORDER — VITAMIN B-1 100 MG PO TABS
100.0000 mg | ORAL_TABLET | Freq: Every day | ORAL | Status: DC
  Administered 2013-11-17 – 2013-11-19 (×3): 100 mg via ORAL
  Filled 2013-11-17 (×3): qty 1

## 2013-11-17 MED ORDER — ONDANSETRON HCL 4 MG/2ML IJ SOLN
4.0000 mg | Freq: Four times a day (QID) | INTRAMUSCULAR | Status: DC | PRN
  Administered 2013-11-17 – 2013-11-19 (×3): 4 mg via INTRAVENOUS
  Filled 2013-11-17 (×3): qty 2

## 2013-11-17 MED ORDER — SODIUM CHLORIDE 0.9 % IV SOLN
INTRAVENOUS | Status: AC
  Administered 2013-11-17 – 2013-11-18 (×3): via INTRAVENOUS

## 2013-11-17 MED ORDER — ONDANSETRON HCL 4 MG/2ML IJ SOLN
4.0000 mg | Freq: Four times a day (QID) | INTRAMUSCULAR | Status: DC | PRN
Start: 2013-11-17 — End: 2013-11-17
  Administered 2013-11-17: 4 mg via INTRAVENOUS
  Filled 2013-11-17: qty 2

## 2013-11-17 MED ORDER — NICOTINE 21 MG/24HR TD PT24
21.0000 mg | MEDICATED_PATCH | Freq: Every day | TRANSDERMAL | Status: DC
Start: 2013-11-17 — End: 2013-11-19
  Administered 2013-11-17 – 2013-11-19 (×3): 21 mg via TRANSDERMAL
  Filled 2013-11-17 (×3): qty 1

## 2013-11-17 MED ORDER — ENOXAPARIN SODIUM 40 MG/0.4ML ~~LOC~~ SOLN
40.0000 mg | SUBCUTANEOUS | Status: DC
  Administered 2013-11-17: 40 mg via SUBCUTANEOUS
  Filled 2013-11-17 (×3): qty 0.4

## 2013-11-17 MED ORDER — TRAZODONE HCL 100 MG PO TABS
100.0000 mg | ORAL_TABLET | Freq: Every day | ORAL | Status: DC
  Administered 2013-11-18 (×2): 100 mg via ORAL
  Filled 2013-11-17 (×4): qty 1

## 2013-11-17 MED ORDER — ASPIRIN EC 81 MG PO TBEC
81.0000 mg | DELAYED_RELEASE_TABLET | Freq: Every day | ORAL | Status: DC
  Administered 2013-11-17 – 2013-11-19 (×3): 81 mg via ORAL
  Filled 2013-11-17 (×3): qty 1

## 2013-11-17 MED ORDER — LORAZEPAM 1 MG PO TABS: 0.0000 mg | ORAL_TABLET | Freq: Two times a day (BID) | ORAL | Status: DC

## 2013-11-17 MED ORDER — PROMETHAZINE HCL 25 MG/ML IJ SOLN
12.5000 mg | Freq: Four times a day (QID) | INTRAMUSCULAR | Status: DC | PRN
  Administered 2013-11-17: 12.5 mg via INTRAMUSCULAR

## 2013-11-17 MED ORDER — PROMETHAZINE HCL 25 MG/ML IJ SOLN
12.5000 mg | INTRAMUSCULAR | Status: DC | PRN
  Administered 2013-11-17: 12.5 mg via INTRAVENOUS
  Administered 2013-11-17: 18:00:00 via INTRAVENOUS
  Administered 2013-11-18 – 2013-11-19 (×4): 12.5 mg via INTRAVENOUS
  Filled 2013-11-17 (×6): qty 1

## 2013-11-17 MED ORDER — PNEUMOCOCCAL VAC POLYVALENT 25 MCG/0.5ML IJ INJ
0.5000 mL | INJECTION | INTRAMUSCULAR | Status: DC
  Filled 2013-11-17: qty 0.5

## 2013-11-17 NOTE — Assessment & Plan Note (Signed)
It was my plan originally to have his orthopaedist manage his chronic L shoulder pain as I am increasingly uncomfortable prescribing chronic narcotics for this patient as he has many red flags. Patient tells me today that he does not know the exact date of his appointment at [facility], but it is some time in March. If patient does not end up getting evaluated by ortho, I will plan to touch base with his orthopaedist myself to discuss patient's chronic shoulder pain and what our next steps need to be to obtain adequate pain control.
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
## 2013-11-17 NOTE — Assessment & Plan Note (Signed)
Patient with N/V and abdominal pain that is worse than his baseline. Patient is unable to take PO currently and I am concerned that he is at risk for GI bleed given his hx of "brown" colored emesis earlier this morning as well as his continued alcohol intake and hx of gastritis. I recommend admission to med surg for observation overnight to help control patient's symptoms. I spoke with Dr. Alice Rieger and discussed the patient w/ him. He agrees to admit the patient. We will obtain STAT basic labs- CMP, CBC, lipase, lactic acid to initiate the work up. No IVF indicated at this time as patient's mucous membranes appear moist and his vomiting started this morning so he has not had time for significant fluid loss. VSS at this time.
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
## 2013-11-18 ENCOUNTER — Encounter (HOSPITAL_COMMUNITY): Payer: Self-pay | Admitting: General Practice

## 2013-11-18 DIAGNOSIS — K859 Acute pancreatitis without necrosis or infection, unspecified: Secondary | ICD-10-CM | POA: Diagnosis present

## 2013-11-18 DIAGNOSIS — M25519 Pain in unspecified shoulder: Secondary | ICD-10-CM

## 2013-11-18 DIAGNOSIS — F339 Major depressive disorder, recurrent, unspecified: Secondary | ICD-10-CM

## 2013-11-18 DIAGNOSIS — M25569 Pain in unspecified knee: Secondary | ICD-10-CM

## 2013-11-18 LAB — COMPREHENSIVE METABOLIC PANEL
ALT: 67 U/L — AB (ref 0–53)
AST: 47 U/L — ABNORMAL HIGH (ref 0–37)
Albumin: 3.7 g/dL (ref 3.5–5.2)
Alkaline Phosphatase: 80 U/L (ref 39–117)
BUN: 21 mg/dL (ref 6–23)
CALCIUM: 9.7 mg/dL (ref 8.4–10.5)
CO2: 17 meq/L — AB (ref 19–32)
CREATININE: 0.63 mg/dL (ref 0.50–1.35)
Chloride: 101 mEq/L (ref 96–112)
GLUCOSE: 123 mg/dL — AB (ref 70–99)
Potassium: 4.6 mEq/L (ref 3.7–5.3)
Sodium: 138 mEq/L (ref 137–147)
TOTAL PROTEIN: 8.1 g/dL (ref 6.0–8.3)
Total Bilirubin: 0.4 mg/dL (ref 0.3–1.2)

## 2013-11-18 LAB — BLOOD GAS, ARTERIAL
ACID-BASE DEFICIT: 0.6 mmol/L (ref 0.0–2.0)
BICARBONATE: 23.2 meq/L (ref 20.0–24.0)
Drawn by: 225631
FIO2: 0.21 %
O2 SAT: 96.5 %
PO2 ART: 83.5 mmHg (ref 80.0–100.0)
Patient temperature: 98.6
TCO2: 24.3 mmol/L (ref 0–100)
pCO2 arterial: 36.1 mmHg (ref 35.0–45.0)
pH, Arterial: 7.424 (ref 7.350–7.450)

## 2013-11-18 LAB — GLUCOSE, CAPILLARY
GLUCOSE-CAPILLARY: 121 mg/dL — AB (ref 70–99)
Glucose-Capillary: 110 mg/dL — ABNORMAL HIGH (ref 70–99)
Glucose-Capillary: 129 mg/dL — ABNORMAL HIGH (ref 70–99)
Glucose-Capillary: 131 mg/dL — ABNORMAL HIGH (ref 70–99)
Glucose-Capillary: 139 mg/dL — ABNORMAL HIGH (ref 70–99)
Glucose-Capillary: 153 mg/dL — ABNORMAL HIGH (ref 70–99)

## 2013-11-18 LAB — ACETAMINOPHEN LEVEL

## 2013-11-18 LAB — ALCOHOL,  ISOPROPYL (ISOPROPANOL)
Acetone: NEGATIVE
Isopropanol: NEGATIVE

## 2013-11-18 LAB — SALICYLATE LEVEL: Salicylate Lvl: <2 mg/dL — ABNORMAL LOW (ref 2.8–20.0)

## 2013-11-18 LAB — ALCOHOL, METHYL (METHANOL), BLOOD: METHANOL LVL: NEGATIVE

## 2013-11-18 LAB — OSMOLALITY: OSMOLALITY: 293 mosm/kg (ref 275–300)

## 2013-11-18 MED ORDER — IBUPROFEN 600 MG PO TABS
600.0000 mg | ORAL_TABLET | Freq: Four times a day (QID) | ORAL | Status: DC | PRN
  Administered 2013-11-18: 600 mg via ORAL
  Filled 2013-11-18: qty 3

## 2013-11-18 MED ORDER — OXYCODONE HCL 5 MG PO TABS
5.0000 mg | ORAL_TABLET | Freq: Four times a day (QID) | ORAL | Status: DC | PRN
  Administered 2013-11-18 – 2013-11-19 (×4): 5 mg via ORAL
  Filled 2013-11-18 (×4): qty 1

## 2013-11-18 NOTE — H&P (Signed)
Patient seen and examined and agree with H and P as outlined above.   COMER, ROBERT, MD 

## 2013-11-18 NOTE — Progress Notes (Signed)
ABG drawn on room air, pt. not on Oxygen @ this time.

## 2013-11-18 NOTE — Progress Notes (Signed)
UR completed 

## 2013-11-18 NOTE — Progress Notes (Signed)
Subjective: Patient reports continued nausea, reports 4 episodes to small amount of NBNB emesis this AM.  Complains of left shoulder pain and some nonspecific abdominal pain.  IV morphine has helped the pain. Per nursing>> have not seen any patient emesis, potentially this AM smell of emesis but none in bedside basin. Patient denies any SI, denies any ingestions of industrial products, tylenol and salicylates. Objective: Vital signs in last 24 hours: Filed Vitals:   11/17/13 1805 11/17/13 2125 11/18/13 0016 11/18/13 0602  BP:  147/94 151/72 145/75  Pulse:  69 68 70  Temp:  97.6 F (36.4 C)  98.1 F (36.7 C)  TempSrc:  Oral  Oral  Resp:  18  18  Height: 5\' 11"  (1.803 m)     Weight: 155 lb 12.8 oz (70.67 kg)   150 lb 2.1 oz (68.1 kg)  SpO2:  99%  98%   Weight change:   Intake/Output Summary (Last 24 hours) at 11/18/13 1054 Last data filed at 11/18/13 0603  Gross per 24 hour  Intake   1268 ml  Output    775 ml  Net    493 ml   General: resting in bed, disheveled appearing HEENT: EOMI, no gross visual field deficits Cardiac: RRR, no rubs, murmurs or gallops Pulm: clear to auscultation bilaterally, moving normal volumes of air Abd: soft, minimal RUQ tenderness, nondistended, BS present Ext: warm and well perfused, no pedal edema Neuro: alert and oriented X3, cranial nerves II-XII grossly intact  Lab Results: Basic Metabolic Panel:  Recent Labs Lab 11/17/13 1441 11/17/13 1831 11/18/13 0434  NA 139  --  138  K 4.3  --  4.6  CL 99  --  101  CO2 23  --  17*  GLUCOSE 120*  --  123*  BUN 17  --  21  CREATININE 0.74  --  0.63  CALCIUM 10.1  --  9.7  MG  --  2.0  --   PHOS  --  3.9  --    Liver Function Tests:  Recent Labs Lab 11/17/13 1441 11/18/13 0434  AST 58* 47*  ALT 82* 67*  ALKPHOS 86 80  BILITOT 0.3 0.4  PROT 8.8* 8.1  ALBUMIN 4.2 3.7    Recent Labs Lab 11/17/13 1441  LIPASE 45   CBC:  Recent Labs Lab 11/13/13 1800 11/17/13 1441  WBC 9.3  12.1*  NEUTROABS  --  8.8*  HGB 13.6 15.6  HCT 39.1 44.7  MCV 97.0 98.0  PLT 389 485*   Cardiac Enzymes:  Recent Labs Lab 11/17/13 2230  TROPONINI <0.30   CBG:  Recent Labs Lab 11/17/13 2022 11/18/13 0009 11/18/13 0416  GLUCAP 146* 129* 121*   Urine Drug Screen: Drugs of Abuse     Component Value Date/Time   LABOPIA POSITIVE* 11/17/2013 2130   LABOPIA NEG 10/30/2013 1446   COCAINSCRNUR NONE DETECTED 11/17/2013 2130   COCAINSCRNUR NEG 10/30/2013 1446   LABBENZ POSITIVE* 11/17/2013 2130   LABBENZ NEG 10/30/2013 1446   AMPHETMU NONE DETECTED 11/17/2013 2130   THCU POSITIVE* 11/17/2013 2130   THCU 27 12/02/2012 1121   LABBARB NONE DETECTED 11/17/2013 2130   LABBARB NEG 10/30/2013 1446    Alcohol Level:  Recent Labs Lab 11/13/13 1800 11/17/13 2230  ETH 164* <11   Urinalysis:  Recent Labs Lab 11/17/13 2130  COLORURINE AMBER*  LABSPEC 1.031*  PHURINE 6.0  GLUCOSEU NEGATIVE  HGBUR NEGATIVE  BILIRUBINUR SMALL*  KETONESUR 15*  PROTEINUR 100*  UROBILINOGEN 1.0  NITRITE NEGATIVE  LEUKOCYTESUR NEGATIVE    Micro Results: No results found for this or any previous visit (from the past 240 hour(s)). Studies/Results: No results found. Medications: I have reviewed the patient's current medications. Scheduled Meds: . aspirin EC  81 mg Oral Daily  . enoxaparin (LOVENOX) injection  40 mg Subcutaneous Q24H  . LORazepam  0-4 mg Oral 4 times per day   Followed by  . [START ON 11/19/2013] LORazepam  0-4 mg Oral Q12H  . nicotine  21 mg Transdermal Daily  . pantoprazole  40 mg Oral Daily  . pneumococcal 23 valent vaccine  0.5 mL Intramuscular Tomorrow-1000  . thiamine  100 mg Oral Daily  . traZODone  100 mg Oral QHS   Continuous Infusions: . sodium chloride 100 mL/hr at 11/18/13 0602   PRN Meds:.ibuprofen, LORazepam, morphine injection, ondansetron (ZOFRAN) IV, promethazine, zolpidem Assessment/Plan: Principal Problem:   Nausea with vomiting, possible acute on chronic  pancreatitis - On admission lipase wnl.  There is some concern that he has an increased anion gap, and recent ED/Psych stay for suicidal ideation as well as chronic alcohol abuse. - AG this AM 20 up from 17 yesterday.  Patient denies ingestions of antifreeze, isopropyl alcohol, tylenol, Aspirin  - Check ABG- returned patient no acidotic - Check Salicylate, Acetaminophen levels - Check Methyl alcohol, ethylene glycol, isopropyl alcohol and osmolality - Continue Zofran IV. - Advance diet as tolerated.    Chronic pain syndrome - Patient was previously prescribed oxycodone, but violated pain management agreement in multiple ways.   - Patient has minimal abdominal tenderness, main complaint is chronic sholder pain. - Will try to wean from morphine.     Alcohol Abuse - CIWA protocol (range overnight 7-11)    Major depression with history of drug abuse - Recently IVC in ED due to SI, psych recommended inpatient rehab unable to find placement and patient's SI resolved.  Was discharged from ED on 2/15. - No current SI - Continue trazodone  Dispo: Disposition is deferred at this time, awaiting improvement of current medical problems.  Anticipated discharge in approximately 1 day(s).   The patient does have a current PCP Rebecca Eaton, MD) and does need an [facility] hospital follow-up appointment after discharge.  The patient does not know have transportation limitations that hinder transportation to clinic appointments.  .Services Needed at time of discharge: Y = Yes, Blank = No PT:   OT:   RN:   Equipment:   Other:     LOS: 1 day   Joni Reining, DO 11/18/2013, 10:54 AM

## 2013-11-19 DIAGNOSIS — G8929 Other chronic pain: Secondary | ICD-10-CM

## 2013-11-19 DIAGNOSIS — F10939 Alcohol use, unspecified with withdrawal, unspecified: Secondary | ICD-10-CM

## 2013-11-19 DIAGNOSIS — F332 Major depressive disorder, recurrent severe without psychotic features: Secondary | ICD-10-CM

## 2013-11-19 DIAGNOSIS — Z5900 Homelessness unspecified: Secondary | ICD-10-CM

## 2013-11-19 DIAGNOSIS — N400 Enlarged prostate without lower urinary tract symptoms: Secondary | ICD-10-CM

## 2013-11-19 DIAGNOSIS — F10239 Alcohol dependence with withdrawal, unspecified: Secondary | ICD-10-CM

## 2013-11-19 DIAGNOSIS — K5289 Other specified noninfective gastroenteritis and colitis: Secondary | ICD-10-CM

## 2013-11-19 DIAGNOSIS — K861 Other chronic pancreatitis: Secondary | ICD-10-CM

## 2013-11-19 DIAGNOSIS — K859 Acute pancreatitis without necrosis or infection, unspecified: Principal | ICD-10-CM

## 2013-11-19 DIAGNOSIS — F1020 Alcohol dependence, uncomplicated: Secondary | ICD-10-CM

## 2013-11-19 DIAGNOSIS — R109 Unspecified abdominal pain: Secondary | ICD-10-CM

## 2013-11-19 DIAGNOSIS — B1920 Unspecified viral hepatitis C without hepatic coma: Secondary | ICD-10-CM

## 2013-11-19 LAB — GLUCOSE, CAPILLARY
Glucose-Capillary: 107 mg/dL — ABNORMAL HIGH (ref 70–99)
Glucose-Capillary: 114 mg/dL — ABNORMAL HIGH (ref 70–99)

## 2013-11-19 LAB — ETHYLENE GLYCOL: Ethylene Glycol Lvl: <5 mg/dL (ref <5)

## 2013-11-19 NOTE — Discharge Instructions (Addendum)
The Internal Medicine Center at [facility] cannot prescribe you narcotic pain medications for your chronic pain as you have broken your pain contract.   Resource Guide 1) Find a Tax adviser and Pay Out of Pocket Although you won't have to find out who is covered by your insurance plan, it is a good idea to ask around and get recommendations. You will then need to call the office and see if the doctor you have chosen will accept you as a new patient and what types of options they offer for patients who are self-pay. Some doctors offer discounts or will set up payment plans for their patients who do not have insurance, but you will need to ask so you aren't surprised when you get to your appointment.  2) Contact Your Local Health Department Not all health departments have doctors that can see patients for sick visits, but many do, so it is worth a call to see if yours does. If you don't know where your local health department is, you can check in your phone book. The CDC also has a tool to help you locate your state's health department, and many state websites also have listings of all of their local health departments.  3) Find a [facility] If your illness is not likely to be very severe or complicated, you may want to try a walk in clinic. These are popping up all over the country in pharmacies, drugstores, and shopping centers. They're usually staffed by nurse practitioners or physician assistants that have been trained to treat common illnesses and complaints. They're usually fairly quick and inexpensive. However, if you have serious medical issues or chronic medical problems, these are probably not your best option.  No Primary Care Doctor: - Call Health Connect at  [phone] - they can help you locate a primary care doctor that  accepts your insurance, provides certain services, etc. - Physician Referral Service- [phone]  Chronic Pain Problems: Organization         Address  Phone    Notes  [facility]  [phone] Patients need to be referred by their primary care doctor.   Medication Assistance: Organization         Address  Phone   Notes  [facility] Medication [facility] [address] [place], [place] [address] [phone] --Must be a resident of [facility] -- Must have NO insurance coverage whatsoever (no Medicaid/ Medicare, etc.) -- The pt. MUST have a primary care doctor that directs their care regularly and follows them in the community   MedAssist  [phone]   [employer]  [phone]    Agencies that provide inexpensive medical care: Organization         Address  Phone   Notes  [place]  [phone]   [facility]    [phone]   [facility] [place], [place] [address] [phone]   [place] [address] [place] [phone]   Planned Parenthood    6823397134   [facility]    [phone]   [place] [address] [place], [place] Phone:  [phone], Fax:  [phone] Hours of Operation:  9 am - 6 pm, M-F.  Also accepts Medicaid/Medicare and self-pay.  [facility] for [place], [address], [place] Phone: [phone],  Fax: [phone]. Hours of Operation:  8:30 am - 5:30 pm, M-F.  Also accepts Medicaid and self-pay.  [facility] [place] [address], [place] Phone: [phone]   [place], [place], [place] [phone], Ext. [phone] Mondays & Thursdays: 7-9 AM.  First 15 patients are seen on a first come, first serve basis.    [place] Providers:  Organization         Address  Phone   Notes  [facility] [address],  [phone] Also accepts self-pay patients.  [facility]  [address], [place]  [phone]   [place], [address], [place] [phone]   [facility] [address], [place] [phone]   Lucianne Lei [address], [place]   [phone] Only accepts [place] Access [place] patients after they have their name applied to their card.   Self-Pay (no insurance) in [facility]:  Organization         Address  Phone   Notes  Sickle Cell Patients, [facility] [place] [phone]   [facility] [place] [phone]   [facility] [place]  [place], [place], [place] [phone]   [facility]/Dr. Osei-Bonsu  [address], [place] or [address], [place] [phone] Phone number for both [place] and [place] locations is the same.  Urgent Medical and [facility] [address], [place] [phone]   [facility] [address] [place] or [address] [phone]   [facility] [address] [place] [phone], phone; [phone], fax Sees patients 1st and 3rd Saturday of every month.  Must not qualify for public or private insurance (i.e. Medicaid, Medicare, [place] Health Choice, Veterans' Benefits)  Household income should be no more than 200% of the poverty level The clinic cannot treat you if you are pregnant or think you are pregnant  Sexually transmitted diseases are not treated at the clinic.    Dental Care: Organization         Address  Phone  Notes  [facility] Department of [facility] [place] [phone] Accepts children up to age 22 who are enrolled in [place] or [place]; pregnant women with a Medicaid card; and children who have  applied for Medicaid or [place] Health Choice, but were declined, whose parents can pay a reduced fee at time of service.  [facility] Department of [facility]  [address], [place] [phone] Accepts children up to age 33 who are enrolled in [place] or [place]; pregnant women with a Medicaid card; and children who have applied for Medicaid or [place] Health Choice, but were declined, whose parents can pay a reduced fee at time of service.  [place] Adult Dental Access PROGRAM  [place] [phone] Patients are seen by appointment only. Walk-ins are not accepted. [place] will see patients 13 years of age and older. Monday - Tuesday (8am-5pm) Most Wednesdays (8:30-5pm) $30 per visit, cash only  [place] Adult [employer] PROGRAM  [address], [employer] (  [phone] Patients are seen by appointment only. Walk-ins are not accepted. [place] will see patients 46 years of age and older. One Wednesday Evening (Monthly: Volunteer Based).  $30 per visit, cash only  [place]  [phone] for adults; Children under age 38, call Graduate Pediatric Dentistry at [phone]. Children aged 28-14, please call [phone] to request a pediatric application.  Dental services are provided in all areas of dental care including fillings, crowns and bridges, complete and partial dentures, implants, gum treatment, root canals, and extractions. Preventive care is also provided. Treatment is provided to both adults and children. Patients are selected via a lottery and there is often a waiting list.   [facility] [address] [place]  [phone] [email or web address]   [facility] [address] [place], [place] [phone], Ext. [phone] Second and Fourth Thursday of each month, opens at 6:30 AM; Clinic ends at 9 AM.  Patients are seen on a first-come first-served basis, and a  limited number are seen during each clinic.   [facility]  [address] Hillard Danker [place], [place] [phone]   Eligibility Requirements You must have lived in [place], [place], or [place] counties for at least the last three months.   You cannot be eligible for state or federal sponsored [employer], including [employer], [place], or [employer].   You generally cannot be eligible for healthcare insurance through your employer.    How to apply: Eligibility screenings are held every Tuesday and Wednesday afternoon from 1:00 pm until 4:00 pm. You do not need an appointment for the interview!  [facility] [address], [place], [place]   [place]  [place] Department  [place]  [phone]    Behavioral Health Resources in the Community: Intensive Outpatient Programs Organization         Address  Phone  Notes  [place]. [address], [place], [place] [phone]   [facility] Outpatient [address] [place], [place]   ADS: Alcohol & Drug Svcs [address] [place], [place]   [place] [address],  [place], [place] or [phone]   Substance Abuse Resources Organization         Address  Phone  Notes  Alcohol and Drug Services  [phone]   [place]  [phone]   [facility] [place]   Chinita Pester  [phone]   Residential & Outpatient Substance Abuse Program  [phone]   Psychological Services Organization         Address  Phone  Notes  [facility]  [place]  [phone]   [place] [address] [place] or [phone]    Mobile Crisis Teams Organization          Address  Phone  Notes  [facility], Mobile Crisis Care Unit  [phone]   [facility]  [address] [place], [place]   [name] [address], [place] [phone]    Self-Help/Support Groups Organization         Address  Phone             Notes  [place]. of [place] - variety of support groups  336- H3156881 Call for more information  Narcotics Anonymous (NA), [facility]aring [facility] [address], [place]  2 meetings at this location   Residential Facilities manager         Address  Phone  Notes  [facility] [place],    [place]  [phone]   [facility]  [address], [place] [address], [place], [place]   [place], [place] [phone] Admissions: 8am-3pm M-F  Incentives Substance [place] [address]    [place], [place] [phone]   [facility] [address] [place], [place], [place]   [facility] [address],  [place], [place]   [facility] Programs - Intensive Outpatient [address]een [address], [place], [place]   [facility] ([place] [place].,  [place], [place] [phone] or [phone]   [facility] Treatment Services (RTS) [address], [place], [place] Accepts Medicaid  [facility] [place] [address]  [place] [phone] Substance Abuse/Addiction Treatment   [facility] Organization         Address  Phone  Notes  [facility]  [phone]   Domenic Schwab, PhD [address] Arlis Porta [place], [place]   [phone] or [phone]   [place] [place], [place] [phone]   [facility] [address] [place], [place] [phone] Insurance/Medicaid/sponsorship  through [facility] and Families [address] [place]                                    [place], [place] [phone] [place] [address] [place], [place] [phone]    Dr. Adele Schilder  [phone]   [facility] Dept. 1) [address], [place] 2) [place] 3)  [place] [address], [place] [phone]  [phone]   [place] [phone] or [phone] (After Hours)      Acute Pancreatitis Acute pancreatitis is a disease in which the pancreas becomes suddenly irritated (inflamed). The pancreas is a large gland behind your stomach. The pancreas makes enzymes that help digest food. The pancreas also makes 2 hormones that help control your blood sugar. Acute pancreatitis happens when the enzymes attack and damage the pancreas. Most attacks last a couple of days and can cause serious problems. HOME CARE  Follow your doctor's diet instructions. You may need to avoid alcohol and limit fat in your diet.  Eat small meals often.  Drink enough fluids to keep your pee (urine) clear or pale yellow.  Only take medicines as told by your doctor.  Avoid drinking alcohol if it caused your disease.  Do not smoke.  Get plenty of rest.  Check your blood sugar at home as told by your doctor.  Keep all doctor visits as told. GET HELP RIGHT AWAY IF:   You are unable to eat or keep fluids down.  Your pain becomes severe.  You have a fever or lasting symptoms for more than 2 to 3 days.  You have a fever and your symptoms suddenly get worse.  Your skin or the white part of your  eyes turn yellow (jaundice).  You throw up (vomit).  You feel dizzy, or you pass out (faint).  Your blood sugar is high (over 300 mg/dL).  You do not get better as quickly as expected.  You have new or worsening symptoms.  You have lasting  pain, weakness, or feel sick to your stomach (nauseous).  You get better and then have another pain attack. MAKE SURE YOU:   Understand these instructions.  Will watch your condition.  Will get help right away if you are not doing well or get worse. Document Released: 03/06/2008 Document Revised: 03/19/2012 Document Reviewed: 12/28/2011 [facility] Patient Information 2014 [place].

## 2013-11-19 NOTE — Progress Notes (Signed)
Patient discharge Home per Md order.  Discharge instructions reviewed with patient and family.  Copies of all forms given and explained. Patient/family voiced understanding of all instructions.  Discharge in no acute distress. 
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
## 2013-11-19 NOTE — Progress Notes (Signed)
PT had crackers,peanut butter,ice cream and coffee,c/o nausea but no vomiting,noted clear spit in emesis basis,phenergan 12.5 mg IV given. Will monitor.

## 2013-11-19 NOTE — Progress Notes (Signed)
INTERNAL MEDICINE TEACHING SERVICE DAILY PROGRESS NOTE  Subjective: Patient tolerated dinner last night per nursing. Did complain of some nausea later and spit up into bedside basin but no emesis. He admits he was able to eat dinner.  He does note that his abdominal pain has resolved but the pain medication is not enough for his shoulder as he used to receive it every 4 hours. For his chronic pain he reports he has an upcoming appointment with Dr. Stephanie Acre - Orthopaedics at [facility] in march and that he needs pain medications to get him to that appointment.  He does have a letter from [facility] dated 09/03/13 that says his next appointment with Dr. Stephanie Acre is 10/15/13 @10 :45am.  And with his writing below it says Feb 2 at 3pm.  I have called their office and have been informed that he did have appointments on both the 14th and the 2nd but he was a no show for both.  He has no upcoming appointments. I have informed him that I cannot prescribe him pain medications for his chronic pain as I am a part of the same practice as his PCP and he has violated our pain agreement. Objective: Vital signs in last 24 hours: Filed Vitals:   11/18/13 1740 11/18/13 2127 11/19/13 0500 11/19/13 0641  BP: 114/58 104/55  167/87  Pulse: 68 62  66  Temp: 98 F (36.7 C) 98.1 F (36.7 C)  97.7 F (36.5 C)  TempSrc: Oral Oral  Oral  Resp: 16 16  18   Height:      Weight:   152 lb 1.9 oz (69 kg)   SpO2: 100% 100%  98%   Weight change: -3 lb 10.9 oz (-1.67 kg)  Intake/Output Summary (Last 24 hours) at 11/19/13 0850 Last data filed at 11/18/13 1300  Gross per 24 hour  Intake    240 ml  Output      0 ml  Net    240 ml   General: resting in bed, showered this AM HEENT: EOMI, no gross visual field deficits Cardiac: RRR, no rubs, murmurs or gallops Pulm: clear to auscultation bilaterally, moving normal volumes of air Abd: soft, nontender, nondistended, BS present Ext: warm and well perfused, no pedal edema Neuro:  alert and oriented X3  Lab Results: Basic Metabolic Panel:  Recent Labs Lab 11/17/13 1441 11/17/13 1831 11/18/13 0434  NA 139  --  138  K 4.3  --  4.6  CL 99  --  101  CO2 23  --  17*  GLUCOSE 120*  --  123*  BUN 17  --  21  CREATININE 0.74  --  0.63  CALCIUM 10.1  --  9.7  MG  --  2.0  --   PHOS  --  3.9  --    Liver Function Tests:  Recent Labs Lab 11/17/13 1441 11/18/13 0434  AST 58* 47*  ALT 82* 67*  ALKPHOS 86 80  BILITOT 0.3 0.4  PROT 8.8* 8.1  ALBUMIN 4.2 3.7    Recent Labs Lab 11/17/13 1441  LIPASE 45   CBC:  Recent Labs Lab 11/13/13 1800 11/17/13 1441  WBC 9.3 12.1*  NEUTROABS  --  8.8*  HGB 13.6 15.6  HCT 39.1 44.7  MCV 97.0 98.0  PLT 389 485*   Cardiac Enzymes:  Recent Labs Lab 11/17/13 2230  TROPONINI <0.30   CBG:  Recent Labs Lab 11/18/13 0416 11/18/13 0729 11/18/13 1140 11/18/13 1607 11/18/13 2112 11/19/13 0820  GLUCAP 121* 131* 110* 139* 153* 107*   Urine Drug Screen: Drugs of Abuse     Component Value Date/Time   LABOPIA POSITIVE* 11/17/2013 2130   LABOPIA NEG 10/30/2013 1446   COCAINSCRNUR NONE DETECTED 11/17/2013 2130   COCAINSCRNUR NEG 10/30/2013 1446   LABBENZ POSITIVE* 11/17/2013 2130   LABBENZ NEG 10/30/2013 1446   AMPHETMU NONE DETECTED 11/17/2013 2130   THCU POSITIVE* 11/17/2013 2130   THCU 27 12/02/2012 1121   LABBARB NONE DETECTED 11/17/2013 2130   LABBARB NEG 10/30/2013 1446    Alcohol Level:  Recent Labs Lab 11/13/13 1800 11/17/13 2230  ETH 164* <11   Urinalysis:  Recent Labs Lab 11/17/13 2130  COLORURINE AMBER*  LABSPEC 1.031*  PHURINE 6.0  GLUCOSEU NEGATIVE  HGBUR NEGATIVE  BILIRUBINUR SMALL*  KETONESUR 15*  PROTEINUR 100*  UROBILINOGEN 1.0  NITRITE NEGATIVE  LEUKOCYTESUR NEGATIVE    Micro Results: No results found for this or any previous visit (from the past 240 hour(s)). Studies/Results: No results found. Medications: I have reviewed the patient's current medications. Scheduled  Meds: . aspirin EC  81 mg Oral Daily  . enoxaparin (LOVENOX) injection  40 mg Subcutaneous Q24H  . LORazepam  0-4 mg Oral 4 times per day   Followed by  . LORazepam  0-4 mg Oral Q12H  . nicotine  21 mg Transdermal Daily  . pantoprazole  40 mg Oral Daily  . pneumococcal 23 valent vaccine  0.5 mL Intramuscular Tomorrow-1000  . thiamine  100 mg Oral Daily  . traZODone  100 mg Oral QHS   Continuous Infusions:   PRN Meds:.ibuprofen, LORazepam, ondansetron (ZOFRAN) IV, oxyCODONE, promethazine, zolpidem Assessment/Plan: Principal Problem:   Nausea with vomiting, possible acute on chronic pancreatitis - Patient reports mild nausea, no emesis - Able to tolerate food by mouth, no abdominal tenderness. - Safe to discharge home.    Chronic pain syndrome - Patient was previously prescribed oxycodone, but violated pain management agreement in multiple ways.  Patient reports that Dr. Stephanie Acre is taking over his pain management but per my phone conversation he has missed two appointments and cannot have an appt made at this time.  He has never been prescribed narcotic pain medications by Dr Stephanie Acre. - D/C pain medications, will not prescribe as outpatient. 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  Was discharged from ED on 2/15. - No current SI - Continue trazodone  Dispo: Able to tolerate food by mouth. Can be safely discharged.  The patient does have a current PCP Rebecca Eaton, MD) and does need an [facility] hospital follow-up appointment after discharge.  The patient does not know have transportation limitations that hinder transportation to clinic appointments.  .Services Needed at time of discharge: Y = Yes, Blank = No PT:   OT:   RN:   Equipment:   Other:      LOS: 2 days   Joni Reining, DO 11/19/2013, 8:50 AM

## 2013-11-24 NOTE — Discharge Summary (Signed)
Agree with plan as outlined in summary.  

## 2013-11-26 ENCOUNTER — Ambulatory Visit: Payer: Self-pay | Admitting: Internal Medicine

## 2013-12-22 ENCOUNTER — Encounter: Payer: Self-pay | Admitting: Internal Medicine

## 2013-12-22 ENCOUNTER — Ambulatory Visit (INDEPENDENT_AMBULATORY_CARE_PROVIDER_SITE_OTHER): Payer: No Typology Code available for payment source | Admitting: Internal Medicine

## 2013-12-22 VITALS — BP 115/76 | HR 76 | Temp 97.3°F | Ht 70.0 in | Wt 160.8 lb

## 2013-12-22 DIAGNOSIS — Z5900 Homelessness unspecified: Secondary | ICD-10-CM

## 2013-12-22 DIAGNOSIS — Z79899 Other long term (current) drug therapy: Secondary | ICD-10-CM

## 2013-12-22 DIAGNOSIS — M25512 Pain in left shoulder: Secondary | ICD-10-CM

## 2013-12-22 DIAGNOSIS — F1020 Alcohol dependence, uncomplicated: Secondary | ICD-10-CM

## 2013-12-22 DIAGNOSIS — M25569 Pain in unspecified knee: Secondary | ICD-10-CM

## 2013-12-22 DIAGNOSIS — R109 Unspecified abdominal pain: Secondary | ICD-10-CM

## 2013-12-22 DIAGNOSIS — H538 Other visual disturbances: Secondary | ICD-10-CM | POA: Insufficient documentation

## 2013-12-22 DIAGNOSIS — F332 Major depressive disorder, recurrent severe without psychotic features: Secondary | ICD-10-CM

## 2013-12-22 DIAGNOSIS — G8929 Other chronic pain: Secondary | ICD-10-CM

## 2013-12-22 DIAGNOSIS — K529 Noninfective gastroenteritis and colitis, unspecified: Secondary | ICD-10-CM

## 2013-12-22 DIAGNOSIS — Z0289 Encounter for other administrative examinations: Secondary | ICD-10-CM

## 2013-12-22 DIAGNOSIS — M25519 Pain in unspecified shoulder: Secondary | ICD-10-CM

## 2013-12-22 DIAGNOSIS — F339 Major depressive disorder, recurrent, unspecified: Secondary | ICD-10-CM

## 2013-12-22 MED ORDER — PROMETHAZINE HCL 12.5 MG PO TABS: 12.5000 mg | ORAL_TABLET | Freq: Four times a day (QID) | ORAL | Status: DC | PRN

## 2013-12-22 MED ORDER — TRAZODONE HCL 100 MG PO TABS: 100.0000 mg | ORAL_TABLET | Freq: Every day | ORAL | Status: DC

## 2013-12-22 MED ORDER — ASPIRIN EC 81 MG PO TBEC: 81.0000 mg | DELAYED_RELEASE_TABLET | Freq: Every day | ORAL | Status: DC

## 2013-12-22 MED ORDER — OMEPRAZOLE 40 MG PO CPDR: 40.0000 mg | DELAYED_RELEASE_CAPSULE | Freq: Every day | ORAL | Status: DC

## 2013-12-22 NOTE — Assessment & Plan Note (Signed)
Patient has stopped drinking, last drink 3.5 weeks ago. 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 He has hopes of complete alcohol cessation, though he states to me "I cannot be sure what will happen in the future, but for now this is what I want to do." He denies any drug use. He is still smoking 1/4 ppd cigarettes.
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
## 2013-12-22 NOTE — Patient Instructions (Signed)
Thank you for your visit.  Today I gave you a prescription for phenergan, prilosec, and trazodone. We also gave you $18 out of the emergency fund for you to have these medications filled.   Please see your orthopaedist for your chronic left shoulder pain and your right knee pain. We can no longer prescribe you narcotics.  Follow up in 1 month.

## 2013-12-22 NOTE — Progress Notes (Signed)
Patient ID: [name], male   DOB: 12/10/1955, 58 y.o.   MRN: [id number] HPI The patient is a 58 y.o. male with a history of alcohol abuse, chronic abd pain, chronic L shoulder and R knee pain , homelessness who presents for routine clinic visit.   Please see problem based charting for further details.   ROS: General: no fevers, chills Skin: no rash HEENT: +blurry vision, no sore throat Pulm: no dyspnea, coughing CV: no chest pain, palpitations, shortness of breath Abd: +nausea, epigastric abd pain; no vomiting or diarrhea GU: no dysuria Ext: L shoulder pain, R knee pain Neuro: no weakness, numbness, or tingling  Filed Vitals:   12/22/13 1441  BP: 115/76  Pulse: 76  Temp: 97.3 F (36.3 C)  WT 160.8lbs (152lbs 2/18) SpO2 98% r/a  Physical Exam General: alert, cooperative, and in no apparent distress; appears to be in better spirits than I have seen him in a while HEENT: pupils equal round and reactive to light, vision grossly intact, oropharynx clear and non-erythematous  Neck: supple Lungs: clear to ascultation bilaterally, normal work of respiration Heart: regular rate and rhythm, no murmurs, gallops, or rubs Abdomen: soft, TTP epigastrium and periumbilical region  Extremities: warm b/l, no pedal edema Neurologic: alert & oriented X3, cranial nerves II-XII grossly intact, strength grossly intact, sensation intact to light touch    Medication List       This list is accurate as of: 12/22/13  3:28 PM.  Always use your most recent med list.               aspirin EC 81 MG tablet  Take 1 tablet (81 mg total) by mouth daily.     omeprazole 40 MG capsule  Commonly known as:  PRILOSEC  Take 1 capsule (40 mg total) by mouth daily.            promethazine 12.5 MG tablet  Commonly known as:  PHENERGAN  Take 1 tablet (12.5 mg total) by mouth every 6 (six) hours as needed for nausea or vomiting.     traZODone 100 MG tablet  Commonly known as:  DESYREL  Take 1  tablet (100 mg total) by mouth at bedtime.       Assessment/Plan

## 2013-12-22 NOTE — Assessment & Plan Note (Signed)
Patient currently taking trazodone on an "as needed" basis to help with sleep. I informed patient that in order for this medication to help with his depression and anxiety he will need to take it every night. He agrees. I also this that if he takes this daily that it may help him sleep despite the pain in his joints. No SI or HI today.

## 2013-12-22 NOTE — Assessment & Plan Note (Signed)
Likely 2/2 gastritis as prior EGD done in 2014 confirmed gastritis and esophagitis. Patient c/o abd pain mostly after eating that is located in epigastrium and associate with some nausea, no vomiting or diarrhea. He looks much better than he has on previous visits. He has not been taking the prilosec at all since he was discharged from the hospital last month as he has no money to pay for any of his medications. Similarly, he has not been taking any narcotics or phenergan because he cannot pay for them. Today we discussed the importance of patient maintaining compliance with his prilosec as this is the best chance for his stomach pain and nausea to improve. At this point, he has never been compliant with this medication for longer than a couple of weeks. i explained the importance of taking this medication. Patient had no money to pay for this rx, so we gave him $18 out of the emergency fund to pay for the prilosec, phenergan, and trazodone. He guaranteed [place] [facility]. He has no excuse to not have been taking this medication at his 1 month follow up visit.

## 2013-12-22 NOTE — Assessment & Plan Note (Signed)
Patient has constant R lateral knee pain and reports sometimes his knee gives way on him when he is walking or standing. Does not describe having his knee lock or pop. Knee is exam only significant for TTP over lateral joint line. He is scheduled to see orthopaedics on 3/30. I asked him to bring this up to his orthopaedist. He tells me the pain is so severe recently that he cannot sleep. Given he has violated his pain contract with our clinic multiple times in the past, I cannot prescribe any more narcotics for this patient. He cannot be seen at a pain clinic as he does not have any insurance other than [employer].

## 2013-12-22 NOTE — Assessment & Plan Note (Signed)
Patient c/o bilateral eye blurriness, worse with focusing on things up close. He uses reading glasses, but these do not help enough recently. I cannot refer him to the optometrist or ophtho at this time as [facility] does not have referrals available until next month. I will plan to refer him to have his vision checked at his next visit in 1 month.

## 2013-12-25 NOTE — Progress Notes (Signed)
Case discussed with Dr. Mechele Claude at the time of the visit.  We reviewed the resident's history and exam and pertinent patient test results.  I agree with the assessment, diagnosis, and plan of care documented in the resident's note.

## 2014-01-26 ENCOUNTER — Emergency Department (HOSPITAL_COMMUNITY)
Admission: EM | Admit: 2014-01-26 | Discharge: 2014-01-27 | Disposition: A | Discharge status: Home / Self Care | Payer: No Typology Code available for payment source | Attending: Emergency Medicine | Admitting: Emergency Medicine

## 2014-01-26 ENCOUNTER — Encounter: Payer: Self-pay | Admitting: Internal Medicine

## 2014-01-26 ENCOUNTER — Ambulatory Visit (INDEPENDENT_AMBULATORY_CARE_PROVIDER_SITE_OTHER): Payer: No Typology Code available for payment source | Admitting: Internal Medicine

## 2014-01-26 ENCOUNTER — Encounter (HOSPITAL_COMMUNITY): Payer: Self-pay | Admitting: Emergency Medicine

## 2014-01-26 VITALS — BP 109/69 | HR 87 | Temp 97.2°F | Ht 70.0 in | Wt 159.1 lb

## 2014-01-26 DIAGNOSIS — Z8781 Personal history of (healed) traumatic fracture: Secondary | ICD-10-CM | POA: Insufficient documentation

## 2014-01-26 DIAGNOSIS — Z862 Personal history of diseases of the blood and blood-forming organs and certain disorders involving the immune mechanism: Secondary | ICD-10-CM | POA: Insufficient documentation

## 2014-01-26 DIAGNOSIS — G8929 Other chronic pain: Secondary | ICD-10-CM

## 2014-01-26 DIAGNOSIS — M19019 Primary osteoarthritis, unspecified shoulder: Secondary | ICD-10-CM | POA: Insufficient documentation

## 2014-01-26 DIAGNOSIS — Z5900 Homelessness unspecified: Secondary | ICD-10-CM

## 2014-01-26 DIAGNOSIS — F411 Generalized anxiety disorder: Secondary | ICD-10-CM | POA: Insufficient documentation

## 2014-01-26 DIAGNOSIS — M129 Arthropathy, unspecified: Secondary | ICD-10-CM | POA: Insufficient documentation

## 2014-01-26 DIAGNOSIS — F332 Major depressive disorder, recurrent severe without psychotic features: Secondary | ICD-10-CM

## 2014-01-26 DIAGNOSIS — M25512 Pain in left shoulder: Secondary | ICD-10-CM

## 2014-01-26 DIAGNOSIS — Z87448 Personal history of other diseases of urinary system: Secondary | ICD-10-CM | POA: Insufficient documentation

## 2014-01-26 DIAGNOSIS — IMO0002 Reserved for concepts with insufficient information to code with codable children: Secondary | ICD-10-CM | POA: Insufficient documentation

## 2014-01-26 DIAGNOSIS — H538 Other visual disturbances: Secondary | ICD-10-CM

## 2014-01-26 DIAGNOSIS — M25519 Pain in unspecified shoulder: Secondary | ICD-10-CM

## 2014-01-26 DIAGNOSIS — Z79899 Other long term (current) drug therapy: Secondary | ICD-10-CM | POA: Insufficient documentation

## 2014-01-26 DIAGNOSIS — R109 Unspecified abdominal pain: Secondary | ICD-10-CM

## 2014-01-26 DIAGNOSIS — F329 Major depressive disorder, single episode, unspecified: Secondary | ICD-10-CM

## 2014-01-26 DIAGNOSIS — S46009A Unspecified injury of muscle(s) and tendon(s) of the rotator cuff of unspecified shoulder, initial encounter: Secondary | ICD-10-CM

## 2014-01-26 DIAGNOSIS — Z7982 Long term (current) use of aspirin: Secondary | ICD-10-CM | POA: Insufficient documentation

## 2014-01-26 DIAGNOSIS — Z8639 Personal history of other endocrine, nutritional and metabolic disease: Secondary | ICD-10-CM | POA: Insufficient documentation

## 2014-01-26 DIAGNOSIS — K529 Noninfective gastroenteritis and colitis, unspecified: Secondary | ICD-10-CM

## 2014-01-26 DIAGNOSIS — F339 Major depressive disorder, recurrent, unspecified: Secondary | ICD-10-CM

## 2014-01-26 DIAGNOSIS — K219 Gastro-esophageal reflux disease without esophagitis: Secondary | ICD-10-CM | POA: Insufficient documentation

## 2014-01-26 DIAGNOSIS — F1020 Alcohol dependence, uncomplicated: Secondary | ICD-10-CM

## 2014-01-26 DIAGNOSIS — F17200 Nicotine dependence, unspecified, uncomplicated: Secondary | ICD-10-CM | POA: Insufficient documentation

## 2014-01-26 DIAGNOSIS — Z8619 Personal history of other infectious and parasitic diseases: Secondary | ICD-10-CM | POA: Insufficient documentation

## 2014-01-26 DIAGNOSIS — M25569 Pain in unspecified knee: Secondary | ICD-10-CM

## 2014-01-26 DIAGNOSIS — F3289 Other specified depressive episodes: Secondary | ICD-10-CM | POA: Insufficient documentation

## 2014-01-26 DIAGNOSIS — R45851 Suicidal ideations: Secondary | ICD-10-CM | POA: Insufficient documentation

## 2014-01-26 DIAGNOSIS — Z8701 Personal history of pneumonia (recurrent): Secondary | ICD-10-CM | POA: Insufficient documentation

## 2014-01-26 DIAGNOSIS — Z9089 Acquired absence of other organs: Secondary | ICD-10-CM | POA: Insufficient documentation

## 2014-01-26 DIAGNOSIS — F1010 Alcohol abuse, uncomplicated: Secondary | ICD-10-CM | POA: Insufficient documentation

## 2014-01-26 DIAGNOSIS — F32A Depression, unspecified: Secondary | ICD-10-CM

## 2014-01-26 LAB — COMPREHENSIVE METABOLIC PANEL
ALK PHOS: 88 U/L (ref 39–117)
ALT: 133 U/L — AB (ref 0–53)
AST: 117 U/L — ABNORMAL HIGH (ref 0–37)
Albumin: 3.9 g/dL (ref 3.5–5.2)
BUN: 5 mg/dL — ABNORMAL LOW (ref 6–23)
CO2: 23 meq/L (ref 19–32)
Calcium: 9.3 mg/dL (ref 8.4–10.5)
Chloride: 102 mEq/L (ref 96–112)
Creatinine, Ser: 0.72 mg/dL (ref 0.50–1.35)
GFR calc Af Amer: >90 mL/min (ref >90)
GFR calc non Af Amer: >90 mL/min (ref >90)
Glucose, Bld: 92 mg/dL (ref 70–99)
POTASSIUM: 4.6 meq/L (ref 3.7–5.3)
SODIUM: 140 meq/L (ref 137–147)
TOTAL PROTEIN: 8.2 g/dL (ref 6.0–8.3)
Total Bilirubin: 0.2 mg/dL — ABNORMAL LOW (ref 0.3–1.2)

## 2014-01-26 LAB — CBC
HCT: 44.1 % (ref 39.0–52.0)
HEMOGLOBIN: 15.3 g/dL (ref 13.0–17.0)
MCH: 32.9 pg (ref 26.0–34.0)
MCHC: 34.7 g/dL (ref 30.0–36.0)
MCV: 94.8 fL (ref 78.0–100.0)
Platelets: 332 10*3/uL (ref 150–400)
RBC: 4.65 MIL/uL (ref 4.22–5.81)
RDW: 12.6 % (ref 11.5–15.5)
WBC: 8.6 10*3/uL (ref 4.0–10.5)

## 2014-01-26 LAB — ACETAMINOPHEN LEVEL

## 2014-01-26 LAB — RAPID URINE DRUG SCREEN, HOSP PERFORMED
AMPHETAMINES: NOT DETECTED
Barbiturates: NOT DETECTED
Benzodiazepines: NOT DETECTED
COCAINE: NOT DETECTED
Opiates: NOT DETECTED
TETRAHYDROCANNABINOL: NOT DETECTED

## 2014-01-26 LAB — SALICYLATE LEVEL: Salicylate Lvl: <2 mg/dL — ABNORMAL LOW (ref 2.8–20.0)

## 2014-01-26 LAB — ETHANOL: Alcohol, Ethyl (B): 87 mg/dL — ABNORMAL HIGH (ref 0–11)

## 2014-01-26 MED ORDER — TRAZODONE HCL 100 MG PO TABS: 100.0000 mg | ORAL_TABLET | Freq: Every day | ORAL | Status: DC

## 2014-01-26 MED ORDER — OMEPRAZOLE 40 MG PO CPDR: 40.0000 mg | DELAYED_RELEASE_CAPSULE | Freq: Every day | ORAL | Status: DC

## 2014-01-26 MED ORDER — PROMETHAZINE HCL 12.5 MG PO TABS: 12.5000 mg | ORAL_TABLET | Freq: Four times a day (QID) | ORAL | Status: DC | PRN

## 2014-01-26 MED ORDER — ZOLPIDEM TARTRATE 5 MG PO TABS: 5.0000 mg | ORAL_TABLET | Freq: Every evening | ORAL | Status: DC | PRN

## 2014-01-26 MED ORDER — LORAZEPAM 1 MG PO TABS: 1.0000 mg | ORAL_TABLET | Freq: Three times a day (TID) | ORAL | Status: DC | PRN

## 2014-01-26 NOTE — ED Notes (Signed)
Per [facility], pt's TTS to be done within the next 10-15 minutes.

## 2014-01-26 NOTE — ED Notes (Signed)
TTS in process now.

## 2014-01-26 NOTE — ED Notes (Signed)
Pt now stating he would like to be transferred to [facility].

## 2014-01-26 NOTE — Progress Notes (Signed)
Received call from nursing stating that patient would like to leave and does not wish to go to [facility] if pain will not be addressed.  Discussed with provider Patriciaann Clan who indicated patient does not meet criteria for IVC and can be provided outpatient resources should he desire treatment. Contacted nurse and  ER provider PA Green to inform not to pursue IVC. Requested patient receive community resource paperwork at bedside.

## 2014-01-26 NOTE — Assessment & Plan Note (Addendum)
Patient c/o blurry vision during his last visit. Will submit ophthalmology referral today for evaluation as was previously planned.

## 2014-01-26 NOTE — Assessment & Plan Note (Signed)
See plan under "left shoulder pain"
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
## 2014-01-26 NOTE — ED Provider Notes (Signed)
Medical screening examination/treatment/procedure(s) were performed by non-physician practitioner and as supervising physician I was immediately available for consultation/collaboration.   EKG Interpretation None       Threasa Beards, MD 01/26/14 2314

## 2014-01-26 NOTE — ED Notes (Signed)
Spoke with Estill Bamberg at [facility] regarding pt's desire to leave AMA.  IVC papers to be filled out.

## 2014-01-26 NOTE — Progress Notes (Signed)
Called the ED; talked to charge nurse,Christy -stated to go to Triage. Pt taken to the ED;agreed to go to the ED.

## 2014-01-26 NOTE — ED Notes (Signed)
Ron, [facility] made aware of need for sitter.

## 2014-01-26 NOTE — ED Notes (Signed)
Spoke with Gerald Stabs from service response, carb mod dinner ordered.  Pt resps e/u. Pt speaking in clear sentences during introduction.  Warm blanket given.  Sitter at bedside.
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
## 2014-01-26 NOTE — ED Notes (Signed)
Pt reports chronic pain of rod in left shoulder and chronic pancreatitis. Reports SI due to pain and tired of dealing with the pain. Denies plan. Pt is calm and cooperative at this time.

## 2014-01-26 NOTE — Assessment & Plan Note (Signed)
Most likely 2/2 gastritis given prior EGD findings. Abd pain and N/V seem to have improved dramatically since patient decreased his alcohol intake and took prilosec consistent over the last month. It is important that patient take prilosec. He now has the orange card. I refilled this medication today along with the phenergan. I am hopeful that he will be able to get these prescriptions filled as he has improved while taking them.

## 2014-01-26 NOTE — Progress Notes (Signed)
TTS assessment complete. Patient admitted to [facility] bed 303-1 per Patriciaann Clan for polysubstance abuse and substance induced mood disorder. Contacted POD C RN who states she will have patient sign support documents and fax back to TTS.

## 2014-01-26 NOTE — Progress Notes (Signed)
Received call from RN at [facility] stating patient has changed his mind and will accept admission at [facility].  Discussed with [facility] who indicates bed is  Still available. Patient will be admitted to [facility] bed 303-1. RN is faxing support documents to TTS.

## 2014-01-26 NOTE — Progress Notes (Signed)
Patient ID: [name], male   DOB: 06/22/1956, 58 y.o.   MRN: [id number] HPI The patient is a 58 y.o. male with a history of alcohol abuse, chronic abd pain, chronic L shoulder and R knee pain , homelessness who presents for routine clinic visit.   Depression: Patient's main complaint today is depressed mood, admits to suicidal ideation currently. He has chronic orthopedic pain (L shoulder and R knee) that keep him up at night. He is unable to sleep. He has trouble walking anywhere. He wants to end his life. He can't take the pain anymore. He has extremely poor family support (daughter and son do not live in [place]) and no access to money to pay for his medications. He has no explicit plan to commit suicide, though he does state he wants to take his life due his many life stressors currently.  Chronic pain: He has chronic L shoulder and R knee pain from multiple prior injuries, which hinders his mobility and impairs his sleep. He is seen by orthopaedics at [facility] (last evaluated 3/30). Patient has had multiple prior violations of his pain contract with our clinic (UDS + cocaine, marijuana, as well as throwing away a prescription of oxycontin) and has been told numerous times that we can no longer prescribe narcotics to him. During his last visit with me in March, we discussed that he needed to address his chronic pain issues with his orthopaedist (Dr. Stephanie Acre). According to care everywhere, it does not look as though Dr. Stephanie Acre prescribed any pain medication for patient. Dr. Stephanie Acre did inject his knee, however, as well as obtained an MRI of L shoulder (below). Patient was unable to follow up with Dr. Stephanie Acre for his appointment in April as he had no ride. He has an appointment May 27th.   MRI L shoulder done at [facility] 11/2013 (per care everywhere):  1. No rotator cuff tear identified.  2. Findings concerning for an anterior label tear with a portion displaced posteriorly along the anterior articular surface of the glenoid.  Evaluation of the labrum is limited in absence of joint distention.  3. Chronic bony remodeling of the inferior scapular neck and plate and screw fixation of of the distal clavicle related to remote trauma.   Alcohol abuse: Patient has been in and out of alcohol abuse rehab centers since I first saw him November 2014. He has had multiple relapses. Most recently, patient had been at [facility] in [facility] Had been clean for approx 1.5 months, but left that facility last week because he "bumped heads" with the director. Past reasons for leaving rehab facilities seem to be similar.   Gastritis: Abd seems improved after pt took prilosec daily for the last month (was given money from our clinic to pay for prilosec during his last clinic visit). Pain is made worse with eating. N/V has improved. Pt has some phenergan left and is almost out of prilosec.  ROS: General: no fevers, chills Skin: no rash HEENT: +blurry vision, no HA Pulm: no dyspnea, coughing, wheezing CV: no chest pain, palpitations, shortness of breath Abd: see HPI GU: no dysuria Ext: see HPI Neuro: no weakness, numbness, or tingling Psych: depressed mood, SI  Filed Vitals:   01/26/14 1420  BP: 109/69  Pulse: 87  Temp: 97.2 F (36.2 C)    Physical Exam General: alert, cooperative, and in no apparent distress; disheveled, poor hygiene   HEENT: pupils equal round and reactive to light, vision grossly intact, oropharynx clear and  non-erythematous  Neck: supple Lungs: CTAB Heart: RRR Abdomen: soft, non-tender, non-distended, normal bowel sounds Extremities: warm, no pedal edema  Neurologic: alert & oriented X3, cranial nerves II-XII intact, normal gait, moves all extremities spontaneously Psych: depressed affect and mood; normal speech, thought content and process wnl; patient states he feels like he will kill himself if he goes home because "I can't take the pain any more"  Current Outpatient Prescriptions on File  Prior to Visit  Medication Sig Dispense Refill  . aspirin EC 81 MG tablet Take 1 tablet (81 mg total) by mouth daily.  30 tablet  1  . omeprazole (PRILOSEC) 40 MG capsule Take 1 capsule (40 mg total) by mouth daily.  30 capsule  0  . Oxycodone HCl 10 MG TABS Take 1 tablet (10 mg total) by mouth every 4 (four) hours.  60 tablet  0  . promethazine (PHENERGAN) 12.5 MG tablet Take 1 tablet (12.5 mg total) by mouth every 6 (six) hours as needed for nausea or vomiting.  30 tablet  0  . traZODone (DESYREL) 100 MG tablet Take 1 tablet (100 mg total) by mouth at bedtime.  14 tablet  0   No current facility-administered medications on file prior to visit.    Assessment/Plan

## 2014-01-26 NOTE — ED Notes (Signed)
Pt stating that he does not want any treatment at [facility] if he is not going to get anything for pain.  PA to be notified.

## 2014-01-26 NOTE — ED Provider Notes (Signed)
Medical screening examination/treatment/procedure(s) were performed by non-physician practitioner and as supervising physician I was immediately available for consultation/collaboration.   EKG Interpretation None       Threasa Beards, MD 01/26/14 [phone]

## 2014-01-26 NOTE — Assessment & Plan Note (Signed)
Patient states he had been clean and in rehab at [facility] in [facility] until approx 1 week ago. He left because he and the director did not get along. Pt had 1 beer last night, no other alcohol intake. Seems to be doing better, though now he is living in the woods again, I am unsure of how sustainable this status will be for him.

## 2014-01-26 NOTE — Assessment & Plan Note (Signed)
Patient with depressed mood and is tearful in the exam room today. He has numerous life stressor. He is homeless, no financial resources, no familial support. A lot of physical pain. He tells me today that he wants to die and might commit suicide if allowed to go back to the woods. I think the main reason patient is so depressed is that he has poor pain control. Unfortunately, patient has violated our pain contract and I can no longer prescribe him narcotics. I did offer an alternative pain medication, which he refused. I do think patient has significant depression. Given he is telling me his is suicidal, per protocol, he was escorted to the ED for further evaluation by emergency psych services.

## 2014-01-26 NOTE — ED Notes (Signed)
Per Estill Bamberg from [facility], pt does not meet criteria for IVC.  PA to be made aware.  [place] recommends pt for outpt resources if he does not want to come to [facility] for treatment.

## 2014-01-26 NOTE — Discharge Instructions (Signed)
Alcohol Problems °Most adults who drink alcohol drink in moderation (not a lot) are at low risk for developing problems related to their drinking. However, all drinkers, including low-risk drinkers, should know about the health risks connected with drinking alcohol. °RECOMMENDATIONS FOR LOW-RISK DRINKING  °Drink in moderation. Moderate drinking is defined as follows:  °· Men - no more than 2 drinks per day. °· Nonpregnant women - no more than 1 drink per day. °· Over age 65 - no more than 1 drink per day. °A standard drink is 12 grams of pure alcohol, which is equal to a 12 ounce bottle of beer or wine cooler, a 5 ounce glass of wine, or 1.5 ounces of distilled spirits (such as whiskey, brandy, vodka, or rum).  °ABSTAIN FROM (DO NOT DRINK) ALCOHOL: °· When pregnant or considering pregnancy. °· When taking a medication that interacts with alcohol. °· If you are alcohol dependent. °· A medical condition that prohibits drinking alcohol (such as ulcer, liver disease, or heart disease). °DISCUSS WITH YOUR CAREGIVER: °· If you are at risk for coronary heart disease, discuss the potential benefits and risks of alcohol use: Light to moderate drinking is associated with lower rates of coronary heart disease in certain populations (for example, men over age 45 and postmenopausal women). Infrequent or nondrinkers are advised not to begin light to moderate drinking to reduce the risk of coronary heart disease so as to avoid creating an alcohol-related problem. Similar protective effects can likely be gained through proper diet and exercise. °· Women and the elderly have smaller amounts of body water than men. As a result women and the elderly achieve a higher blood alcohol concentration after drinking the same amount of alcohol. °· Exposing a fetus to alcohol can cause a broad range of birth defects referred to as Fetal Alcohol Syndrome (FAS) or Alcohol-Related Birth Defects (ARBD). Although FAS/ARBD is connected with excessive  alcohol consumption during pregnancy, studies also have reported neurobehavioral problems in infants born to mothers reporting drinking an average of 1 drink per day during pregnancy. °· Heavier drinking (the consumption of more than 4 drinks per occasion by men and more than 3 drinks per occasion by women) impairs learning (cognitive) and psychomotor functions and increases the risk of alcohol-related problems, including accidents and injuries. °CAGE QUESTIONS:  °· Have you ever felt that you should Cut down on your drinking? °· Have people Annoyed you by criticizing your drinking? °· Have you ever felt bad or Guilty about your drinking? °· Have you ever had a drink first thing in the morning to steady your nerves or get rid of a hangover (Eye opener)? °If you answered positively to any of these questions: You may be at risk for alcohol-related problems if alcohol consumption is:  °· Men: Greater than 14 drinks per week or more than 4 drinks per occasion. °· Women: Greater than 7 drinks per week or more than 3 drinks per occasion. °Do you or your family have a medical history of alcohol-related problems, such as: °· Blackouts. °· Sexual dysfunction. °· Depression. °· Trauma. °· Liver dysfunction. °· Sleep disorders. °· Hypertension. °· Chronic abdominal pain. °· Has your drinking ever caused you problems, such as problems with your family, problems with your work (or school) performance, or accidents/injuries? °· Do you have a compulsion to drink or a preoccupation with drinking? °· Do you have poor control or are you unable to stop drinking once you have started? °· Do you have to drink to   avoid withdrawal symptoms?  Do you have problems with withdrawal such as tremors, nausea, sweats, or mood disturbances?  Does it take more alcohol than in the past to get you high?  Do you feel a strong urge to drink?  Do you change your plans so that you can have a drink?  Do you ever drink in the morning to relieve  the shakes or a hangover? If you have answered a number of the previous questions positively, it may be time for you to talk to your caregivers, family, and friends and see if they think you have a problem. Alcoholism is a chemical dependency that keeps getting worse and will eventually destroy your health and relationships. Many alcoholics end up dead, impoverished, or in prison. This is often the end result of all chemical dependency.  Do not be discouraged if you are not ready to take action immediately.  Decisions to change behavior often involve up and down desires to change and feeling like you cannot decide.  Try to think more seriously about your drinking behavior.  Think of the reasons to quit. WHERE TO GO FOR ADDITIONAL INFORMATION   The [place] on Alcohol Abuse and Alcoholism (NIAAA) http://www.bradshaw.com/  [employer] on Alcoholism and Drug Dependence (NCADD) www.ncadd.[place] (ASAM) http://carpenter.net/  Document Released: 09/18/2005 Document Revised: 12/11/2011 Document Reviewed: 05/06/2008 [facility] Patient Information 2014 [place]. Chronic Pain Chronic pain can be defined as pain that is off and on and lasts for 3 6 months or longer. Many things cause chronic pain, which can make it difficult to make a diagnosis. There are many treatment options available for chronic pain. However, finding a treatment that works well for you may require trying various approaches until the right one is found. Many people benefit from a combination of two or more types of treatment to control their pain. SYMPTOMS  Chronic pain can occur anywhere in the body and can range from mild to very severe. Some types of chronic pain include:  Headache.  Low back pain.  Cancer pain.  Arthritis pain.  Neurogenic pain. This is pain resulting from damage to nerves. People with chronic pain may also have other symptoms such  as:  Depression.  Anger.  Insomnia.  Anxiety. DIAGNOSIS  Your health care provider will help diagnose your condition over time. In many cases, the initial focus will be on excluding possible conditions that could be causing the pain. Depending on your symptoms, your health care provider may order tests to diagnose your condition. Some of these tests may include:   Blood tests.   CT scan.   MRI.   X-rays.   Ultrasounds.   Nerve conduction studies.  You may need to see a specialist.  TREATMENT  Finding treatment that works well may take time. You may be referred to a pain specialist. He or she may prescribe medicine or therapies, such as:   Mindful meditation or yoga.  Shots (injections) of numbing or pain-relieving medicines into the spine or area of pain.  Local electrical stimulation.  Acupuncture.   Massage therapy.   Aroma, color, light, or sound therapy.   Biofeedback.   Working with a physical therapist to keep from getting stiff.   Regular, gentle exercise.   Cognitive or behavioral therapy.   Group support.  Sometimes, surgery may be recommended.  HOME CARE INSTRUCTIONS   Take all medicines as directed by your health care provider.   Lessen stress in your life  by relaxing and doing things such as listening to calming music.   Exercise or be active as directed by your health care provider.   Eat a healthy diet and include things such as vegetables, fruits, fish, and lean meats in your diet.   Keep all follow-up appointments with your health care provider.   Attend a support group with others suffering from chronic pain. SEEK MEDICAL CARE IF:   Your pain gets worse.   You develop a new pain that was not there before.   You cannot tolerate medicines given to you by your health care provider.   You have new symptoms since your last visit with your health care provider.  SEEK IMMEDIATE MEDICAL CARE IF:   You feel weak.    You have decreased sensation or numbness.   You lose control of bowel or bladder function.   Your pain suddenly gets much worse.   You develop shaking.  You develop chills.  You develop confusion.  You develop chest pain.  You develop shortness of breath.  MAKE SURE YOU:  Understand these instructions.  Will watch your condition.  Will get help right away if you are not doing well or get worse. Document Released: 06/10/2002 Document Revised: 05/21/2013 Document Reviewed: 03/14/2013 [facility] Patient Information 2014 [place].

## 2014-01-26 NOTE — Progress Notes (Signed)
Spoke to patient's provider Verdene Rio for clinical staffing on patient. Requested nurse set up cart for assessment.

## 2014-01-27 ENCOUNTER — Encounter (HOSPITAL_COMMUNITY): Payer: Self-pay | Admitting: *Deleted

## 2014-01-27 ENCOUNTER — Inpatient Hospital Stay (HOSPITAL_COMMUNITY)
Admission: EM | Admit: 2014-01-27 | Discharge: 2014-01-29 | DRG: 897 | Disposition: A | Discharge status: Home / Self Care | Payer: Federal, State, Local not specified - Other | Source: Intra-hospital | Attending: Psychiatry | Admitting: Psychiatry

## 2014-01-27 DIAGNOSIS — M129 Arthropathy, unspecified: Secondary | ICD-10-CM | POA: Diagnosis present

## 2014-01-27 DIAGNOSIS — F1020 Alcohol dependence, uncomplicated: Secondary | ICD-10-CM | POA: Diagnosis present

## 2014-01-27 DIAGNOSIS — F1994 Other psychoactive substance use, unspecified with psychoactive substance-induced mood disorder: Secondary | ICD-10-CM | POA: Diagnosis present

## 2014-01-27 DIAGNOSIS — M25512 Pain in left shoulder: Secondary | ICD-10-CM

## 2014-01-27 DIAGNOSIS — F321 Major depressive disorder, single episode, moderate: Secondary | ICD-10-CM | POA: Diagnosis present

## 2014-01-27 DIAGNOSIS — Z23 Encounter for immunization: Secondary | ICD-10-CM

## 2014-01-27 DIAGNOSIS — G8929 Other chronic pain: Secondary | ICD-10-CM | POA: Diagnosis present

## 2014-01-27 DIAGNOSIS — F17200 Nicotine dependence, unspecified, uncomplicated: Secondary | ICD-10-CM | POA: Diagnosis present

## 2014-01-27 DIAGNOSIS — Z8249 Family history of ischemic heart disease and other diseases of the circulatory system: Secondary | ICD-10-CM

## 2014-01-27 DIAGNOSIS — G4700 Insomnia, unspecified: Secondary | ICD-10-CM | POA: Diagnosis present

## 2014-01-27 DIAGNOSIS — F1120 Opioid dependence, uncomplicated: Principal | ICD-10-CM | POA: Diagnosis present

## 2014-01-27 DIAGNOSIS — K219 Gastro-esophageal reflux disease without esophagitis: Secondary | ICD-10-CM | POA: Diagnosis present

## 2014-01-27 DIAGNOSIS — F411 Generalized anxiety disorder: Secondary | ICD-10-CM | POA: Diagnosis present

## 2014-01-27 MED ORDER — ONDANSETRON 4 MG PO TBDP: 4.0000 mg | ORAL_TABLET | Freq: Four times a day (QID) | ORAL | Status: DC | PRN

## 2014-01-27 MED ORDER — VITAMIN B-1 100 MG PO TABS
100.0000 mg | ORAL_TABLET | Freq: Every day | ORAL | Status: DC
  Administered 2014-01-28 – 2014-01-29 (×2): 100 mg via ORAL
  Filled 2014-01-27 (×3): qty 1

## 2014-01-27 MED ORDER — PANTOPRAZOLE SODIUM 40 MG PO TBEC
40.0000 mg | DELAYED_RELEASE_TABLET | Freq: Every day | ORAL | Status: DC
  Administered 2014-01-27 – 2014-01-29 (×3): 40 mg via ORAL
  Filled 2014-01-27 (×4): qty 1

## 2014-01-27 MED ORDER — ADULT MULTIVITAMIN W/MINERALS CH
1.0000 | ORAL_TABLET | Freq: Every day | ORAL | Status: DC
  Administered 2014-01-27 – 2014-01-29 (×3): 1 via ORAL
  Filled 2014-01-27 (×4): qty 1

## 2014-01-27 MED ORDER — CHLORDIAZEPOXIDE HCL 25 MG PO CAPS
25.0000 mg | ORAL_CAPSULE | Freq: Every day | ORAL | Status: DC
Start: 2014-01-30 — End: 2014-01-29

## 2014-01-27 MED ORDER — TRAZODONE HCL 100 MG PO TABS
100.0000 mg | ORAL_TABLET | Freq: Every day | ORAL | Status: DC
  Filled 2014-01-27: qty 1

## 2014-01-27 MED ORDER — IBUPROFEN 600 MG PO TABS: 600.0000 mg | ORAL_TABLET | Freq: Four times a day (QID) | ORAL | Status: DC | PRN

## 2014-01-27 MED ORDER — THIAMINE HCL 100 MG/ML IJ SOLN: 100.0000 mg | Freq: Once | INTRAMUSCULAR | Status: DC

## 2014-01-27 MED ORDER — NICOTINE 14 MG/24HR TD PT24
14.0000 mg | MEDICATED_PATCH | Freq: Every day | TRANSDERMAL | Status: DC
  Administered 2014-01-27 – 2014-01-28 (×2): 14 mg via TRANSDERMAL
  Filled 2014-01-27 (×6): qty 1

## 2014-01-27 MED ORDER — CHLORDIAZEPOXIDE HCL 25 MG PO CAPS
25.0000 mg | ORAL_CAPSULE | Freq: Four times a day (QID) | ORAL | Status: AC
  Administered 2014-01-27 (×4): 25 mg via ORAL
  Filled 2014-01-27 (×4): qty 1

## 2014-01-27 MED ORDER — DIPHENHYDRAMINE HCL 25 MG PO CAPS
50.0000 mg | ORAL_CAPSULE | Freq: Every evening | ORAL | Status: DC | PRN
  Administered 2014-01-27 (×2): 50 mg via ORAL
  Filled 2014-01-27 (×2): qty 2

## 2014-01-27 MED ORDER — CHLORDIAZEPOXIDE HCL 25 MG PO CAPS
25.0000 mg | ORAL_CAPSULE | ORAL | Status: DC
  Administered 2014-01-29: 25 mg via ORAL
  Filled 2014-01-27: qty 1

## 2014-01-27 MED ORDER — ASPIRIN EC 81 MG PO TBEC
81.0000 mg | DELAYED_RELEASE_TABLET | Freq: Every day | ORAL | Status: DC
  Administered 2014-01-27 – 2014-01-29 (×3): 81 mg via ORAL
  Filled 2014-01-27 (×4): qty 1

## 2014-01-27 MED ORDER — METHOCARBAMOL 500 MG PO TABS
500.0000 mg | ORAL_TABLET | Freq: Four times a day (QID) | ORAL | Status: DC | PRN
  Administered 2014-01-27 – 2014-01-28 (×2): 500 mg via ORAL
  Filled 2014-01-27 (×2): qty 1

## 2014-01-27 MED ORDER — CHLORDIAZEPOXIDE HCL 25 MG PO CAPS
25.0000 mg | ORAL_CAPSULE | Freq: Three times a day (TID) | ORAL | Status: AC
  Administered 2014-01-28 (×3): 25 mg via ORAL
  Filled 2014-01-27 (×3): qty 1

## 2014-01-27 MED ORDER — MAGNESIUM HYDROXIDE 400 MG/5ML PO SUSP: 30.0000 mL | Freq: Every day | ORAL | Status: DC | PRN

## 2014-01-27 MED ORDER — CHLORDIAZEPOXIDE HCL 25 MG PO CAPS
25.0000 mg | ORAL_CAPSULE | Freq: Four times a day (QID) | ORAL | Status: DC | PRN
  Administered 2014-01-27: 25 mg via ORAL
  Filled 2014-01-27: qty 1

## 2014-01-27 MED ORDER — BOOST / RESOURCE BREEZE PO LIQD
1.0000 | Freq: Three times a day (TID) | ORAL | Status: DC
  Administered 2014-01-27 – 2014-01-29 (×4): 1 via ORAL
  Filled 2014-01-27 (×9): qty 1

## 2014-01-27 MED ORDER — LOPERAMIDE HCL 2 MG PO CAPS: 2.0000 mg | ORAL_CAPSULE | ORAL | Status: DC | PRN

## 2014-01-27 MED ORDER — ALUM & MAG HYDROXIDE-SIMETH 200-200-20 MG/5ML PO SUSP: 30.0000 mL | ORAL | Status: DC | PRN

## 2014-01-27 MED ORDER — HYDROXYZINE HCL 25 MG PO TABS
25.0000 mg | ORAL_TABLET | Freq: Four times a day (QID) | ORAL | Status: DC | PRN
  Administered 2014-01-27: 25 mg via ORAL
  Filled 2014-01-27: qty 1

## 2014-01-27 NOTE — Progress Notes (Signed)
Recreation Therapy Notes  Animal-Assisted Activity/Therapy (AAA/T) Program Checklist/Progress Notes Patient Eligibility Criteria Checklist & Daily Group note for Rec Tx Intervention  Date: 04.28.2015 Time: 2:45pm Location: [address]    AAA/T Program Assumption of Risk Form signed by Patient/ or Parent Legal Guardian yes  Patient is free of allergies or sever asthma yes  Patient reports no fear of animals yes  Patient reports no history of cruelty to animals yes   Patient understands his/her participation is voluntary yes  Behavioral Response: DID NOT ATTEND   Lane Hacker, LRT/CTRS  Lane Hacker 01/27/2014 4:39 PM
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
## 2014-01-27 NOTE — Progress Notes (Signed)
Adult Psychoeducational Group Note  Date:  01/27/2014 Time:  11:48 PM  Group Topic/Focus:  Self Care:   The focus of this group is to help patients understand the importance of self-care in order to improve or restore emotional, physical, spiritual, interpersonal, and financial health.  Participation Level:  Did Not Attend  Participation Quality:  did not attend  Affect:  did not attend  Cognitive:  did not attend  Insight: None  Engagement in Group:  did not attend  Modes of Intervention:  Support  Additional Comments:  NA/AA guest speaker. Pt did not attend.  Chyrl Elwell 01/27/2014, 11:48 PM
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
## 2014-01-27 NOTE — Progress Notes (Signed)
INTERNAL MEDICINE TEACHING ATTENDING ADDENDUM - Renise Gillies, MD: I reviewed and discussed at the time of visit with the resident Dr. Chikowski, the patient's medical history, physical examination, diagnosis and results of tests and treatment and I agree with the patient's care as documented. 

## 2014-01-27 NOTE — ED Provider Notes (Signed)
Medical screening examination/treatment/procedure(s) were performed by non-physician practitioner and as supervising physician I was immediately available for consultation/collaboration.   EKG Interpretation None       Threasa Beards, MD 01/27/14 [phone]

## 2014-01-27 NOTE — Progress Notes (Signed)
D Pt. Continues to endorse passive SI, negative HI  A Writer offered support and encouragement,  R Pt remains safe on the unit and contracts for safety at this time.  Received robaxin for pain which gave pt some relief.

## 2014-01-27 NOTE — Tx Team (Signed)
Initial Interdisciplinary Treatment Plan  PATIENT STRENGTHS: (choose at least two) Average or above average intelligence Capable of independent living General fund of knowledge Motivation for treatment/growth  PATIENT STRESSORS: Financial difficulties Health problems Medication change or noncompliance Occupational concerns Substance abuse   PROBLEM LIST: Problem List/Patient Goals Date to be addressed Date deferred Reason deferred Estimated date of resolution  Pt is requesting help in find a clinic that will help his pain control.        Risk for self harm      Depression- poor social support      Homeless- pt lives in the woods in a tent      Substance abuse- Pt states he drinks to decrease his physical pain.  On occasion he smokes marijuana                               DISCHARGE CRITERIA:  Ability to meet basic life and health needs Adequate post-discharge living arrangements Improved stabilization in mood, thinking, and/or behavior Motivation to continue treatment in a less acute level of care Verbal commitment to aftercare and medication compliance Withdrawal symptoms are absent or subacute and managed without 24-hour nursing intervention  PRELIMINARY DISCHARGE PLAN: Attend PHP/IOP Outpatient therapy Placement in alternative living arrangements  PATIENT/FAMIILY INVOLVEMENT: This treatment plan has been presented to and reviewed with the patient, [name], and/or family member.  The patient and family have been given the opportunity to ask questions and make suggestions.  Cory Munch [name] 01/27/2014, 2:44 AM
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
## 2014-01-27 NOTE — Progress Notes (Signed)
The focus of this group is to educate the patient on the purpose and policies of crisis stabilization and provide a format to answer questions about their admission.  The group details unit policies and expectations of patients while admitted. Patient did not attend this group.

## 2014-01-27 NOTE — B[facility] Group Notes (Signed)
[place] LCSW Group Therapy  01/27/2014 1:15 PM   Type of Therapy:  Group Therapy  Participation Level:  Did Not Attend - pt sleeping in his room  Regan Lemming, [place] 01/27/2014 2:39 PM

## 2014-01-27 NOTE — [facility] Suicide Risk Assessment (Signed)
Suicide Risk Assessment  Admission Assessment     Nursing information obtained from:  Patient;Review of record Demographic factors:  Male;Divorced or widowed;Caucasian;Low socioeconomic status;Living alone;Unemployed (homeless) Current Mental Status:  Self-harm thoughts (chronic pain causes the suicidal thoughts) Loss Factors:  Decline in physical health;Financial problems / change in socioeconomic status Historical Factors:  Family history of mental illness or substance abuse Risk Reduction Factors:  NA Total Time spent with patient: 45 minutes  CLINICAL FACTORS:   Depression:   Comorbid alcohol abuse/dependence Alcohol/Substance Abuse/Dependencies  COGNITIVE FEATURES THAT CONTRIBUTE TO RISK:  Closed-mindedness Polarized thinking Thought constriction (tunnel vision)    SUICIDE RISK:   Moderate:   PLAN OF CARE: Supportive approach/coping skills/relapse prevention                               Detox/reassess and address the co morbidities  I certify that inpatient services furnished can reasonably be expected to improve the patient's condition.  Nicholaus Bloom 01/27/2014, 6:44 PM

## 2014-01-28 MED ORDER — PANCRELIPASE (LIP-PROT-AMYL) 12000-38000 UNITS PO CPEP
1.0000 | ORAL_CAPSULE | Freq: Three times a day (TID) | ORAL | Status: DC
  Administered 2014-01-28 – 2014-01-29 (×3): 1 via ORAL
  Filled 2014-01-28 (×6): qty 1

## 2014-01-28 MED ORDER — METHOCARBAMOL 750 MG PO TABS
750.0000 mg | ORAL_TABLET | Freq: Four times a day (QID) | ORAL | Status: DC | PRN
  Administered 2014-01-28 – 2014-01-29 (×2): 750 mg via ORAL
  Filled 2014-01-28 (×2): qty 1

## 2014-01-28 MED ORDER — TRAZODONE HCL 100 MG PO TABS
100.0000 mg | ORAL_TABLET | Freq: Every evening | ORAL | Status: DC | PRN
  Administered 2014-01-28: 100 mg via ORAL
  Filled 2014-01-28: qty 1

## 2014-01-28 MED ORDER — DICLOFENAC SODIUM 1 % TD GEL
4.0000 g | Freq: Four times a day (QID) | TRANSDERMAL | Status: DC
  Administered 2014-01-28 (×3): 4 g via TOPICAL
  Filled 2014-01-28: qty 100

## 2014-01-28 NOTE — Tx Team (Signed)
Interdisciplinary Treatment Plan Update (Adult)  Date: 01/28/2014   Time Reviewed: 11:28 AM  Progress in Treatment:  Attending groups: No.  Participating in groups:  No.  Taking medication as prescribed: Yes  Tolerating medication: Yes  Family/Significant othe contact made: Not yet. SPE required for this pt.  Patient understands diagnosis: Yes, AEB seeking treatment for SI, mood stabilization, ETOH detox. Discussing patient identified problems/goals with staff: Yes  Medical problems stabilized or resolved: Yes  Denies suicidal/homicidal ideation: Yes during self report.  Patient has not harmed self or Others: Yes  New problem(s) identified:  Discharge Plan or Barriers: Pt plans to return to his tent and is not interested in inpatient treatment. Pt plans to follow up with [facility] for med management and was given [facility] information, [facility]Association pamphlets, and [facility] lists. Pt reports that he has surgery on shoulder next month.  Additional comments: 58 Y/O male who states he is dealing with lots of pain lots of medical issues. States he is back in the woods, drinks he claims to take care of his pain. States he drinks every day if he can get it.  Reason for Continuation of Hospitalization: Librium taper-withdrawals Mood stabilization Medication management.  Estimated length of stay: 3-5 days  For review of initial/current patient goals, please see plan of care.  Attendees:  Patient:    Family:    Physician: Carlton Adam MD 01/28/2014 11:27 AM   Nursing: Butch Penny RN 01/28/2014 11:27 AM   Clinical Social Worker [place], [place]  01/28/2014 11:28 AM   Other: Karsten Fells RN  01/28/2014 11:28 AM   Other: Christa RN 01/28/2014 11:28 AM   Other: Gerline Legacy Nurse CM  01/28/2014 11:28 AM   Other: Hardie Pulley. PA 01/28/2014 11:28 AM   Scribe for Treatment Team:  [place] LCSWA 01/28/2014 11:28 AM

## 2014-01-28 NOTE — Progress Notes (Signed)
Adult Psychoeducational Group Note  Date:  01/28/2014 Time:  10:11 PM  Group Topic/Focus:  NA group  Participation Level:  Active  Additional Comments:  Pt attended NA group   Amour Cutrone 01/28/2014, 10:11 PM
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
## 2014-01-28 NOTE — [facility] Group Notes (Signed)
[place] LCSW Group Therapy  01/28/2014 2:55 PM  Type of Therapy:  Group Therapy  Participation Level:  Active  Participation Quality:  Attentive and Monopolizing  Affect:  Anxious and Irritable  Cognitive:  Oriented  Insight:  Improving  Engagement in Therapy:  Monopolizing but redirectable.   Modes of Intervention:  Confrontation, Discussion, Education, Exploration, Problem-solving, Rapport Building, Socialization and Support  Summary of Progress/Problems: Emotion Regulation: This group focused on both positive and negative emotion identification and allowed group members to process ways to identify feelings, regulate negative emotions, and find healthy ways to manage internal/external emotions. Group members were asked to reflect on a time when their reaction to an emotion led to a negative outcome and explored how alternative responses using emotion regulation would have benefited them. Group members were also asked to discuss a time when emotion regulation was utilized when a negative emotion was experienced. [name] was attentive and engaged throughout today's therapy group. He actively participated in group discussion, was monopolizing at times, but was easily redirectable. [name] shared that he struggles with "self loathing and guilt." [name] shows progress in the group setting and improving insight AEB his ability to process how "accepting my physical limitations" and "doing things right by having my surgery and completing physical rehab" will help him gain employment in the long run. [name] shared that he struggles with chronic severe pain, using alcohol to cope with this pain. [name] stated that he needs to "go back to church" stating that AA was not helpful in the past.    [name]  01/28/2014, 2:55 PM
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
## 2014-01-28 NOTE — Progress Notes (Signed)
Patient ID: [name], male   DOB: [birth year], 58 y.o.   MRN: [id number] He has been up at intervals. Stated that he hurts all over that he has rods in his neck. Chronic pain. The MD has ordered additional medications. Self inventory: depression and hopelessness at 7 down from 10's, denies w/d symptoms and SI thoughts. Has been pleasant and cooperative.

## 2014-01-28 NOTE — BHH Suicide Risk Assessment (Signed)
[place] INPATIENT:  Family/Significant Other Suicide Prevention Education  Suicide Prevention Education:  Patient Refusal for Family/Significant Other Suicide Prevention Education: The patient [name] has refused to provide written consent for family/significant other to be provided Family/Significant Other Suicide Prevention Education during admission and/or prior to discharge.  Physician notified.   SPE completed with pt. SPI pamphlet provided to pt and he was encouraged to share information with support network, ask questions, and talk about any concerns relating to SPE. Pt also provided with Mobile Crisis hotline.   Choua Ikner Smart LCSWA  01/28/2014, 3:26 PM

## 2014-01-28 NOTE — BHH Group Notes (Signed)
[facility] LCSW Aftercare Discharge Planning Group Note   01/28/2014 11:27 AM  Participation Quality:  DID NOT ATTEND-pt in room sleeping during d/c planning group.   [place]

## 2014-01-29 DIAGNOSIS — F1020 Alcohol dependence, uncomplicated: Secondary | ICD-10-CM

## 2014-01-29 DIAGNOSIS — F1120 Opioid dependence, uncomplicated: Principal | ICD-10-CM

## 2014-01-29 MED ORDER — TRAZODONE HCL 100 MG PO TABS: 100.0000 mg | ORAL_TABLET | Freq: Every day | ORAL | Status: DC

## 2014-01-29 MED ORDER — OMEPRAZOLE 40 MG PO CPDR: 40.0000 mg | DELAYED_RELEASE_CAPSULE | Freq: Every day | ORAL | Status: DC

## 2014-01-29 NOTE — Progress Notes (Signed)
[facility] Adult Case Management Discharge Plan :  Will you be returning to the same living situation after discharge: Yes,  pt would like to return to tent  At discharge, do you have transportation home?:Yes,  bus pass in chart Do you have the ability to pay for your medications:Yes,  mental health  Release of information consent forms completed and submitted to Medical Records by CSW.  Patient to Follow up at: Follow-up Information   Follow up with [facility]. (Walk in between 8am-9am Monday through Friday for hospital follow-up/medication management/assessment for therapy services if interested. )    Contact information:   [address], [place] [address] Phone: [phone] Fax: [phone]      Patient denies SI/HI:   Yes,  during group/self report.     Safety Planning and Suicide Prevention discussed:  Yes,  Pt refused to consent to family contact. SPI pamphlet provided to pt and SPE completed with him. Pt also encouraged to share information provided with support network, ask questions, and talk about any concerns relating to SPE.  Avannah Decker Smart LCSWA  01/29/2014, 10:26 AM

## 2014-01-29 NOTE — BHH Group Notes (Signed)
[place] Group Notes:  (Nursing/MHT/Case Management/Adjunct)  Date:  01/29/2014  Time:  10:35 AM  Type of Therapy:  Nurse Education  Participation Level:  Did Not Attend  Participation Quality:  did not attend  Affect:  did not attend  Cognitive:  did not attend  Insight:  None  Engagement in Group:  did not attend  Modes of Intervention:  did not attend  Summary of Progress/Problems:  Keyonna [place]un[place] E Jozlynn Plaia 01/29/2014, 10:35 AM
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
## 2014-01-29 NOTE — Progress Notes (Signed)
Patient ID: [name], male   DOB: 1956-02-04, 58 y.o.   MRN: [id number]  Pt. Denies SI/HI and A/V hallucinations. Patient did not fill out daily inventory sheet. Patient was minimal with Probation officer but appreciative for the care. Patient reported, "sweetheart I am hard headed and stuck in my ways. I can't help it. The ministry is mad at me. I am an outdoors person." Belongings returned to patient at time of discharge. Patient denies any new onset of  pain or discomfort. Discharge instructions and medications (no samples/prescriptions) were reviewed with patient. Patient verbalized understanding of both resumed medications and discharge instructions. Q15 minute safety checks were maintained until discharge. No distress upon discharge.

## 2014-02-03 NOTE — Progress Notes (Signed)
Patient Discharge Instructions:  After Visit Summary (AVS):   Faxed to:  02/03/14 Psychiatric Admission Assessment Note:   Faxed to:  02/03/14 Suicide Risk Assessment - Discharge Assessment:   Faxed to:  02/03/14 Faxed/Sent to the Next Level Care provider:  02/03/14 Faxed to [facility] [place], 02/03/2014, 3:57 PM
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
## 2014-07-17 ENCOUNTER — Other Ambulatory Visit: Payer: Self-pay
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
## 2015-01-06 IMAGING — CR DG ABDOMEN ACUTE W/ 1V CHEST
3 series · 3 of 3 positions shown · non-contrast
Comparison: 03/13/2013

CLINICAL DATA: Abdominal pain, nausea and vomiting

ACUTE ABDOMEN SERIES (ABDOMEN 2 VIEW & CHEST 1 VIEW)

[w chest pa]
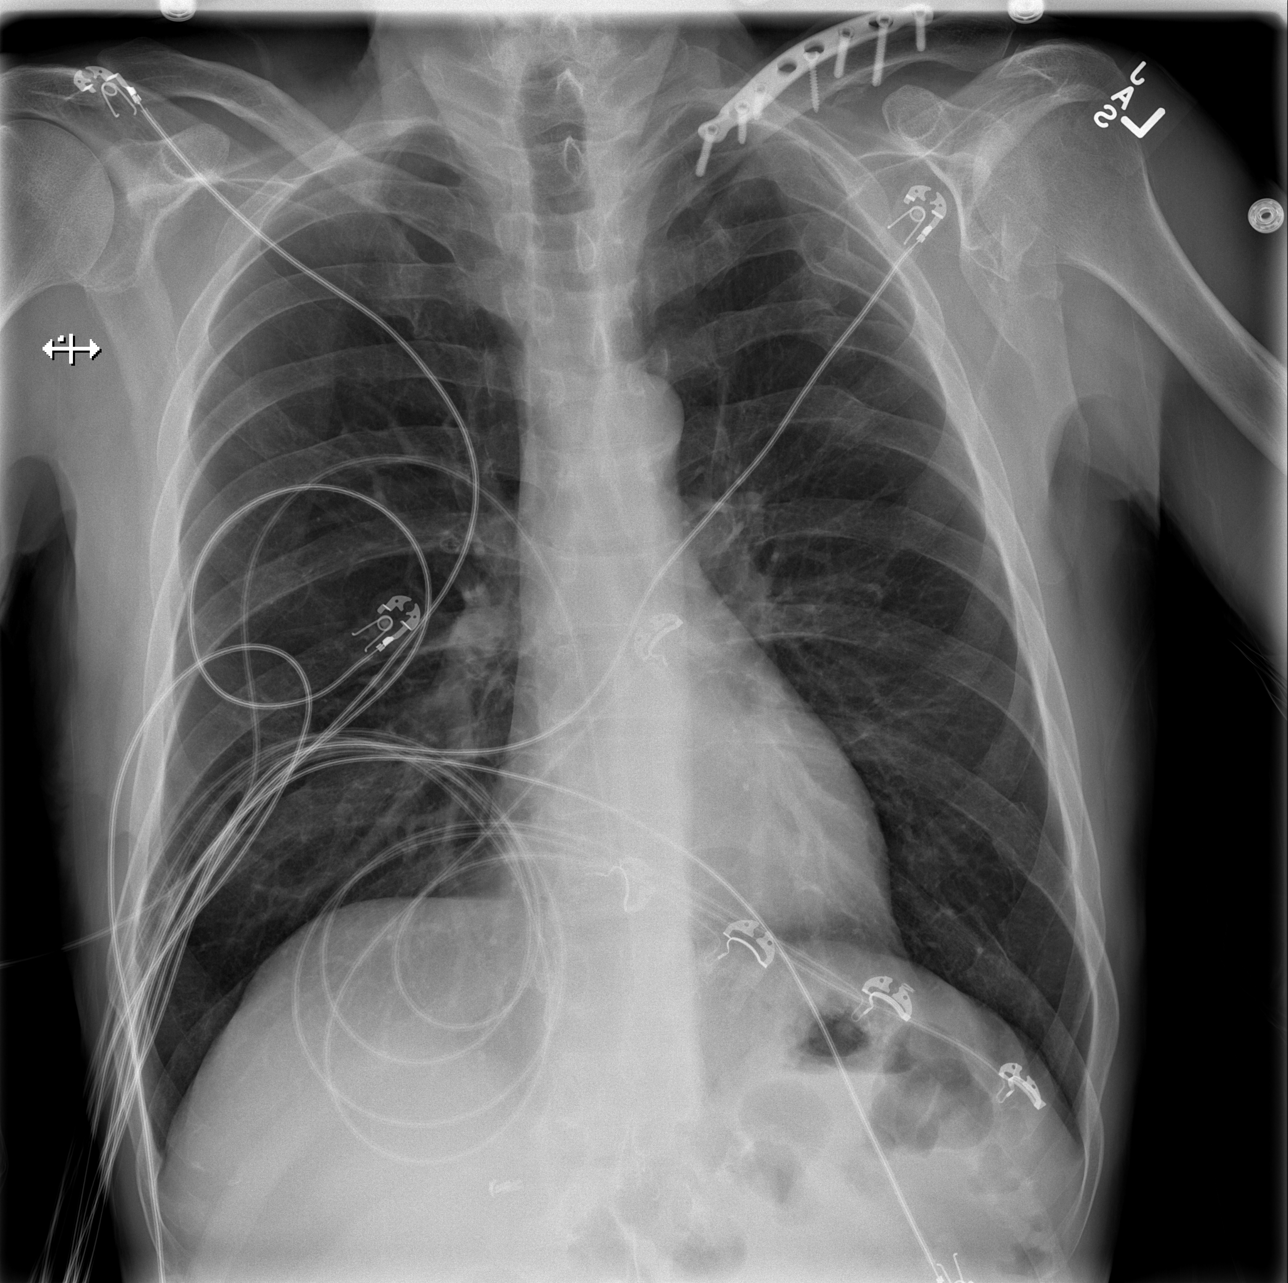

[w abdomen upright]
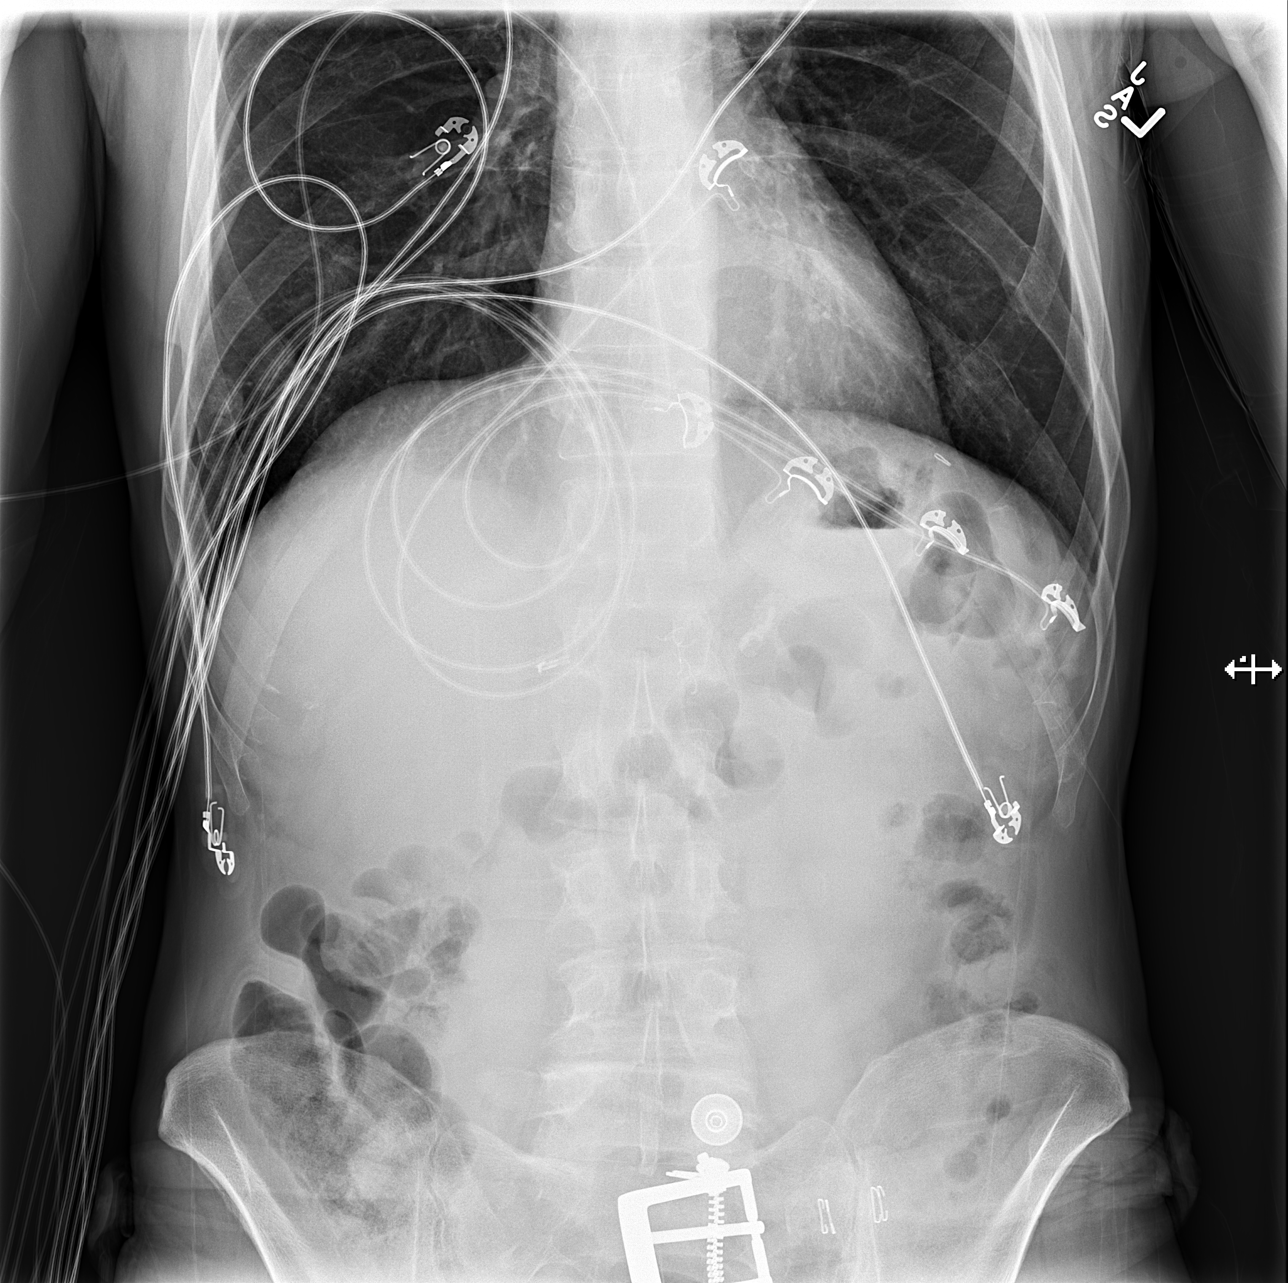

[t abdomen supine]
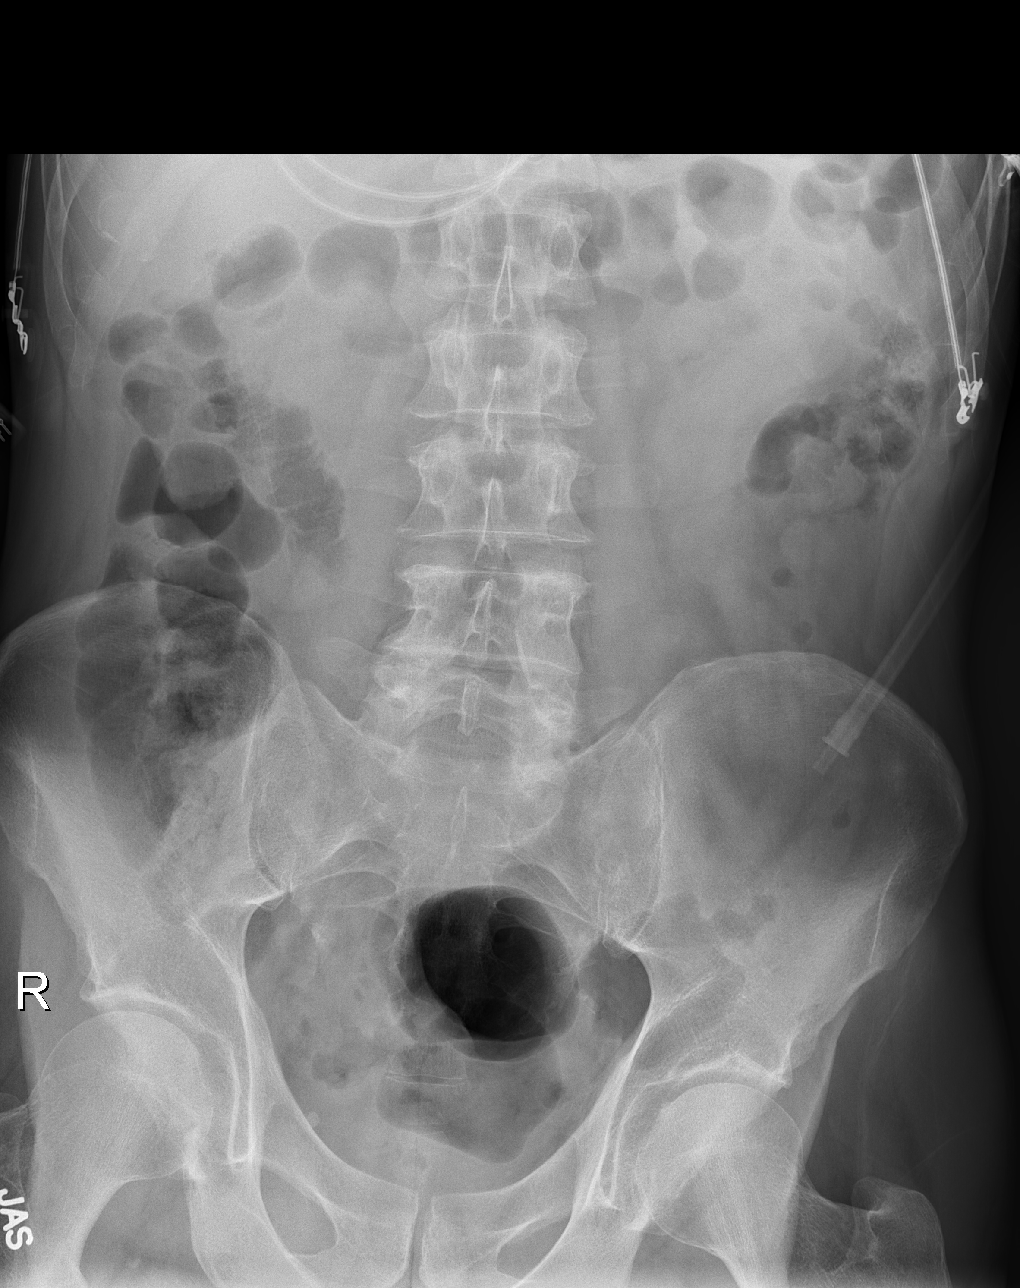

[3 of 3 positions shown; findings below may reference images not displayed]

FINDINGS: Grossly unchanged cardiac silhouette and mediastinal contours.  No
focal airspace opacity.  No pleural effusion or pneumothorax.  No
evidence of edema.

Nonobstructive bowel gas pattern.  No pneumoperitoneum, pneumatosis
or portal venous gas.  Post cholecystectomy.  Additional surgical
clip overlie the midline of the upper abdomen as well as the left
upper abdominal quadrant.

Post ORIF of the left clavicle.  Multiple old/healed posterior left
sided rib fractures.
IMPRESSION: 1.  Nonobstructive bowel gas pattern.
2.  No acute cardiopulmonary disease.

## 2021-03-30 ENCOUNTER — Encounter: Payer: Self-pay | Admitting: Neurology

## 2021-05-30 ENCOUNTER — Ambulatory Visit (INDEPENDENT_AMBULATORY_CARE_PROVIDER_SITE_OTHER): Payer: 59 | Admitting: Neurology

## 2021-05-30 ENCOUNTER — Other Ambulatory Visit: Payer: Self-pay

## 2021-05-30 VITALS — BP 116/73 | HR 92 | Ht 70.0 in | Wt 160.2 lb

## 2021-05-30 DIAGNOSIS — R202 Paresthesia of skin: Secondary | ICD-10-CM

## 2021-05-30 DIAGNOSIS — R55 Syncope and collapse: Secondary | ICD-10-CM | POA: Diagnosis not present

## 2021-05-30 NOTE — Patient Instructions (Signed)
MRI brain without contrast  US carotids  Nerve testing of the left arm  ELECTROMYOGRAM AND NERVE CONDUCTION STUDIES (EMG/NCS) INSTRUCTIONS  How to Prepare The neurologist conducting the EMG will need to know if you have certain medical conditions. Tell the neurologist and other EMG lab personnel if you: Have a pacemaker or any other electrical medical device Take blood-thinning medications Have hemophilia, a blood-clotting disorder that causes prolonged bleeding Bathing Take a shower or bath shortly before your exam in order to remove oils from your skin. Don't apply lotions or creams before the exam.  What to Expect You'll likely be asked to change into a hospital gown for the procedure and lie down on an examination table. The following explanations can help you understand what will happen during the exam.  Electrodes. The neurologist or a technician places surface electrodes at various locations on your skin depending on where you're experiencing symptoms. Or the neurologist may insert needle electrodes at different sites depending on your symptoms.  Sensations. The electrodes will at times transmit a tiny electrical current that you may feel as a twinge or spasm. The needle electrode may cause discomfort or pain that usually ends shortly after the needle is removed. If you are concerned about discomfort or pain, you may want to talk to the neurologist about taking a short break during the exam.  Instructions. During the needle EMG, the neurologist will assess whether there is any spontaneous electrical activity when the muscle is at rest - activity that isn't present in healthy muscle tissue - and the degree of activity when you slightly contract the muscle.  He or she will give you instructions on resting and contracting a muscle at appropriate times. Depending on what muscles and nerves the neurologist is examining, he or she may ask you to change positions during the exam.  After your  EMG You may experience some temporary, minor bruising where the needle electrode was inserted into your muscle. This bruising should fade within several days. If it persists, contact your primary care doctor.
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
## 2021-06-01 ENCOUNTER — Other Ambulatory Visit: Payer: Self-pay

## 2021-06-01 ENCOUNTER — Ambulatory Visit (INDEPENDENT_AMBULATORY_CARE_PROVIDER_SITE_OTHER): Payer: 59 | Admitting: Neurology

## 2021-06-01 DIAGNOSIS — R202 Paresthesia of skin: Secondary | ICD-10-CM

## 2021-06-01 DIAGNOSIS — G5622 Lesion of ulnar nerve, left upper limb: Secondary | ICD-10-CM

## 2021-06-01 NOTE — Procedures (Signed)
[facility]  [place], [address]  [place], [place] [address] Tel: [phone] Fax:  [phone] Test Date:  06/01/2021  Patient: [name] DOB: 08/03/1956 Physician: Narda Amber, DO  Sex: Male Height: '5\' 10"'$  Ref Phys: Narda Amber, DO  ID#: [id number]   Technician:    Patient Complaints: This is a 65 year old man referred for evaluation of left arm paresthesias.  NCV & EMG Findings: Extensive electrodiagnostic testing of the left upper extremity shows:  Left median and mixed palmar sensory responses are within normal limits.  Left ulnar sensory response shows prolonged latency (3.6 ms).   Left median motor responses within normal limits.  Left ulnar motor response shows slowed conduction velocity across the elbow (A Elbow-B Elbow, 32 m/s).  Chronic motor axon loss changes are seen affecting the left ulnar innervated muscles, without accompanied active denervation.  Impression: Left ulnar neuropathy with slowing across the elbow, purely demyelinating, moderate.   ___________________________ Narda Amber, DO    Nerve Conduction Studies Anti Sensory Summary Table   Stim Site NR Peak (ms) Norm Peak (ms) P-T Amp (V) Norm P-T Amp  Left Median Anti Sensory (2nd Digit)  32C  Wrist    3.7 <3.8 15.1 >10  Left Ulnar Anti Sensory (5th Digit)  32C  Wrist    3.6 <3.2 9.9 >5   Motor Summary Table   Stim Site NR Onset (ms) Norm Onset (ms) O-P Amp (mV) Norm O-P Amp Site1 Site2 Delta-0 (ms) Dist (cm) Vel (m/s) Norm Vel (m/s)  Left Median Motor (Abd Poll Brev)  32C  Wrist    3.4 <4.0 9.4 >5 Elbow Wrist 5.4 29.0 54 >50  Elbow    8.8  9.1         Left Ulnar Motor (Abd Dig Minimi)  32C  Wrist    3.0 <3.1 10.3 >7 B Elbow Wrist 3.6 23.0 64 >50  B Elbow    6.6  10.1  A Elbow B Elbow 3.1 10.0 32 >50  A Elbow    9.7  9.6          Comparison Summary Table   Stim Site NR Peak (ms) Norm Peak (ms) P-T Amp (V) Site1 Site2 Delta-P (ms) Norm Delta (ms)  Left  Median/Ulnar Palm Comparison (Wrist - 8cm)  32C  Median Palm    2.0 <2.2 35.6 Median Palm Ulnar Palm 0.0   Ulnar Palm    2.0 <2.2 10.0       EMG   Side Muscle Ins Act Fibs Psw Fasc Number Recrt Dur Dur. Amp Amp. Poly Poly. Comment  Left 1stDorInt Nml Nml Nml Nml 1- Rapid Some 1+ Some 1+ Some 1+ N/A  Left ABD Dig Min Nml Nml Nml Nml 1- Rapid Some 1+ Some 1+ Some 1+ N/A  Left FlexCarpiUln Nml Nml Nml Nml Nml Nml Nml Nml Nml Nml Nml Nml N/A  Left Ext Indicis Nml Nml Nml Nml Nml Nml Nml Nml Nml Nml Nml Nml N/A  Left PronatorTeres Nml Nml Nml Nml Nml Nml Nml Nml Nml Nml Nml Nml N/A  Left Biceps Nml Nml Nml Nml Nml Nml Nml Nml Nml Nml Nml Nml N/A  Left Triceps Nml Nml Nml Nml Nml Nml Nml Nml Nml Nml Nml Nml N/A  Left Deltoid Nml Nml Nml Nml Nml Nml Nml Nml Nml Nml Nml Nml N/A      Waveforms:

## 2021-06-11 ENCOUNTER — Ambulatory Visit
Admission: RE | Admit: 2021-06-11 | Discharge: 2021-06-11 | Disposition: A | Discharge status: Home / Self Care | Payer: 59 | Source: Ambulatory Visit | Attending: Neurology | Admitting: Neurology

## 2021-06-11 ENCOUNTER — Other Ambulatory Visit: Payer: Self-pay

## 2021-06-11 DIAGNOSIS — R55 Syncope and collapse: Secondary | ICD-10-CM

## 2021-06-14 ENCOUNTER — Ambulatory Visit: Payer: 59 | Admitting: Cardiology

## 2021-06-14 ENCOUNTER — Other Ambulatory Visit: Payer: Self-pay

## 2021-06-14 ENCOUNTER — Ambulatory Visit: Payer: 59

## 2021-06-14 ENCOUNTER — Encounter: Payer: Self-pay | Admitting: Cardiology

## 2021-06-14 VITALS — BP 138/83 | HR 76 | Temp 97.6°F | Ht 70.0 in | Wt 159.4 lb

## 2021-06-14 DIAGNOSIS — I6523 Occlusion and stenosis of bilateral carotid arteries: Secondary | ICD-10-CM

## 2021-06-14 DIAGNOSIS — G629 Polyneuropathy, unspecified: Secondary | ICD-10-CM

## 2021-06-14 DIAGNOSIS — R55 Syncope and collapse: Secondary | ICD-10-CM

## 2021-06-14 DIAGNOSIS — H539 Unspecified visual disturbance: Secondary | ICD-10-CM

## 2021-06-14 DIAGNOSIS — F17200 Nicotine dependence, unspecified, uncomplicated: Secondary | ICD-10-CM

## 2021-06-14 DIAGNOSIS — I700 Atherosclerosis of aorta: Secondary | ICD-10-CM

## 2021-06-14 DIAGNOSIS — R072 Precordial pain: Secondary | ICD-10-CM

## 2021-06-14 MED ORDER — ASPIRIN EC 81 MG PO TBEC: 81.0000 mg | DELAYED_RELEASE_TABLET | Freq: Every day | ORAL | 0 refills | Status: AC
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
## 2021-06-16 ENCOUNTER — Other Ambulatory Visit: Payer: 59

## 2021-06-22 ENCOUNTER — Telehealth: Payer: Self-pay | Admitting: Neurology

## 2021-06-22 LAB — CMP14+EGFR
ALT: 9 IU/L (ref 0–44)
AST: 13 IU/L (ref 0–40)
Albumin/Globulin Ratio: 1.5 (ref 1.2–2.2)
Albumin: 4.2 g/dL (ref 3.8–4.8)
Alkaline Phosphatase: 110 IU/L (ref 44–121)
BUN/Creatinine Ratio: 8 — ABNORMAL LOW (ref 10–24)
BUN: 6 mg/dL — ABNORMAL LOW (ref 8–27)
Bilirubin Total: 0.3 mg/dL (ref 0.0–1.2)
CO2: 21 mmol/L (ref 20–29)
Calcium: 9.5 mg/dL (ref 8.6–10.2)
Chloride: 101 mmol/L (ref 96–106)
Creatinine, Ser: 0.79 mg/dL (ref 0.76–1.27)
Globulin, Total: 2.8 g/dL (ref 1.5–4.5)
Glucose: 104 mg/dL — ABNORMAL HIGH (ref 65–99)
Potassium: 4.3 mmol/L (ref 3.5–5.2)
Sodium: 137 mmol/L (ref 134–144)
Total Protein: 7 g/dL (ref 6.0–8.5)
eGFR: 99 mL/min/{1.73_m2} (ref >59)

## 2021-06-22 LAB — LIPID PANEL WITH LDL/HDL RATIO
Cholesterol, Total: 192 mg/dL (ref 100–199)
HDL: 35 mg/dL — ABNORMAL LOW (ref >39)
LDL Chol Calc (NIH): 137 mg/dL — ABNORMAL HIGH (ref 0–99)
LDL/HDL Ratio: 3.9 ratio — ABNORMAL HIGH (ref 0.0–3.6)
Triglycerides: 112 mg/dL (ref 0–149)
VLDL Cholesterol Cal: 20 mg/dL (ref 5–40)

## 2021-06-22 LAB — LDL CHOLESTEROL, DIRECT: LDL Direct: 130 mg/dL — ABNORMAL HIGH (ref 0–99)

## 2021-06-22 LAB — BRAIN NATRIURETIC PEPTIDE: BNP: 56.8 pg/mL (ref 0.0–100.0)

## 2021-06-22 NOTE — Telephone Encounter (Signed)
Please see result notes.  

## 2021-06-22 NOTE — Telephone Encounter (Signed)
Pt called in and left a message with the access nurse. He stated he was returning a call.

## 2021-06-23 ENCOUNTER — Other Ambulatory Visit: Payer: Self-pay | Admitting: Cardiology

## 2021-06-23 ENCOUNTER — Other Ambulatory Visit (HOSPITAL_COMMUNITY): Payer: Self-pay

## 2021-06-23 DIAGNOSIS — I700 Atherosclerosis of aorta: Secondary | ICD-10-CM

## 2021-06-23 DIAGNOSIS — I6523 Occlusion and stenosis of bilateral carotid arteries: Secondary | ICD-10-CM

## 2021-06-23 MED ORDER — ATORVASTATIN CALCIUM 20 MG PO TABS
20.0000 mg | ORAL_TABLET | Freq: Every day | ORAL | 0 refills | Status: DC
  Filled 2021-06-23: qty 90, 90d supply, fill #0

## 2021-06-28 ENCOUNTER — Other Ambulatory Visit: Payer: Self-pay

## 2021-06-28 ENCOUNTER — Ambulatory Visit: Payer: 59

## 2021-06-28 DIAGNOSIS — R072 Precordial pain: Secondary | ICD-10-CM

## 2021-07-01 ENCOUNTER — Other Ambulatory Visit (HOSPITAL_COMMUNITY): Payer: Self-pay

## 2021-07-01 NOTE — Progress Notes (Signed)
Called and spoke to pt, pt voiced understanding.

## 2021-07-08 ENCOUNTER — Other Ambulatory Visit: Payer: 59

## 2021-07-11 ENCOUNTER — Encounter: Payer: Self-pay | Admitting: Cardiology

## 2021-07-11 ENCOUNTER — Ambulatory Visit: Payer: 59 | Admitting: Cardiology

## 2021-07-11 ENCOUNTER — Other Ambulatory Visit: Payer: Self-pay

## 2021-07-11 VITALS — BP 140/85 | HR 88 | Temp 97.9°F | Ht 70.0 in | Wt 161.0 lb

## 2021-07-11 DIAGNOSIS — R072 Precordial pain: Secondary | ICD-10-CM

## 2021-07-11 DIAGNOSIS — E7800 Pure hypercholesterolemia, unspecified: Secondary | ICD-10-CM

## 2021-07-11 DIAGNOSIS — I700 Atherosclerosis of aorta: Secondary | ICD-10-CM

## 2021-07-11 DIAGNOSIS — F17200 Nicotine dependence, unspecified, uncomplicated: Secondary | ICD-10-CM

## 2021-07-11 DIAGNOSIS — I6523 Occlusion and stenosis of bilateral carotid arteries: Secondary | ICD-10-CM
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
## 2021-07-20 ENCOUNTER — Other Ambulatory Visit: Payer: Self-pay

## 2021-07-20 DIAGNOSIS — I700 Atherosclerosis of aorta: Secondary | ICD-10-CM

## 2021-09-12 ENCOUNTER — Encounter: Payer: Self-pay | Admitting: Cardiology

## 2021-09-12 ENCOUNTER — Ambulatory Visit: Payer: 59 | Admitting: Cardiology

## 2021-09-12 ENCOUNTER — Other Ambulatory Visit: Payer: Self-pay

## 2021-09-12 VITALS — BP 109/71 | HR 107 | Temp 98.0°F | Resp 16 | Ht 70.0 in | Wt 162.0 lb

## 2021-09-12 DIAGNOSIS — E7800 Pure hypercholesterolemia, unspecified: Secondary | ICD-10-CM

## 2021-09-12 DIAGNOSIS — F17200 Nicotine dependence, unspecified, uncomplicated: Secondary | ICD-10-CM

## 2021-09-12 DIAGNOSIS — I6523 Occlusion and stenosis of bilateral carotid arteries: Secondary | ICD-10-CM

## 2021-09-12 DIAGNOSIS — G629 Polyneuropathy, unspecified: Secondary | ICD-10-CM

## 2021-09-12 DIAGNOSIS — I700 Atherosclerosis of aorta: Secondary | ICD-10-CM

## 2021-09-12 MED ORDER — ATORVASTATIN CALCIUM 20 MG PO TABS: 20.0000 mg | ORAL_TABLET | Freq: Every day | ORAL | 0 refills | Status: DC
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
## 2021-12-02 ENCOUNTER — Other Ambulatory Visit: Payer: Self-pay | Admitting: Cardiology

## 2021-12-02 DIAGNOSIS — I700 Atherosclerosis of aorta: Secondary | ICD-10-CM

## 2021-12-02 DIAGNOSIS — I6523 Occlusion and stenosis of bilateral carotid arteries: Secondary | ICD-10-CM

## 2022-09-12 ENCOUNTER — Ambulatory Visit: Payer: 59 | Admitting: Cardiology

## 2023-03-22 ENCOUNTER — Encounter: Payer: Self-pay | Admitting: Gastroenterology

## 2024-02-14 ENCOUNTER — Other Ambulatory Visit: Payer: Self-pay

## 2024-02-14 ENCOUNTER — Encounter (HOSPITAL_COMMUNITY): Payer: Self-pay

## 2024-02-14 ENCOUNTER — Emergency Department (HOSPITAL_COMMUNITY)

## 2024-02-14 ENCOUNTER — Emergency Department (HOSPITAL_COMMUNITY)
Admission: EM | Admit: 2024-02-14 | Discharge: 2024-02-14 | Disposition: A | Discharge status: Home / Self Care | Attending: Emergency Medicine | Admitting: Emergency Medicine

## 2024-02-14 DIAGNOSIS — S32000A Wedge compression fracture of unspecified lumbar vertebra, initial encounter for closed fracture: Secondary | ICD-10-CM | POA: Diagnosis not present

## 2024-02-14 DIAGNOSIS — W1839XA Other fall on same level, initial encounter: Secondary | ICD-10-CM | POA: Diagnosis not present

## 2024-02-14 DIAGNOSIS — S3992XA Unspecified injury of lower back, initial encounter: Secondary | ICD-10-CM | POA: Diagnosis present

## 2024-02-14 MED ORDER — DIAZEPAM 5 MG PO TABS
5.0000 mg | ORAL_TABLET | Freq: Once | ORAL | Status: AC
  Administered 2024-02-14: 5 mg via ORAL
  Filled 2024-02-14: qty 1

## 2024-02-14 MED ORDER — LIDOCAINE 4 % EX PTCH: 1.0000 | MEDICATED_PATCH | Freq: Two times a day (BID) | CUTANEOUS | 0 refills | Status: AC

## 2024-02-14 MED ORDER — LIDOCAINE 5 % EX PTCH
1.0000 | MEDICATED_PATCH | CUTANEOUS | Status: DC
  Administered 2024-02-14: 1 via TRANSDERMAL
  Filled 2024-02-14: qty 1

## 2024-02-14 MED ORDER — OXYCODONE HCL 5 MG PO TABS: 5.0000 mg | ORAL_TABLET | ORAL | 0 refills | Status: AC | PRN

## 2024-02-14 MED ORDER — HYDROMORPHONE HCL 1 MG/ML IJ SOLN
1.0000 mg | Freq: Once | INTRAMUSCULAR | Status: AC
  Administered 2024-02-14: 1 mg via INTRAMUSCULAR
  Filled 2024-02-14: qty 1

## 2024-02-14 NOTE — ED Triage Notes (Signed)
 Pt arrived via POV c/o back pain from a fall 6 months ago and had a MRI performed and was told he has compression fractures in his lower back. Pt ambulatory in Triage. Pt requesting pain control help due to being unable to see his PCP until Mid-June.
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
## 2024-02-14 NOTE — Discharge Instructions (Addendum)
 Call the Neurosurgeons for a close follow up. Ration the pain meds as much as you can. Call your pain doctor for close follow-up.  Please get your MRI records from [place] prior to the neurosurgery follow-up.

## 2024-02-14 NOTE — ED Notes (Signed)
 Pt no longer in room, unable to review discharge instructions or review pain level.
